# Patient Record
Sex: Male | Born: 1990
Health system: Southern US, Community
[De-identification: ages and names within clinical notes are randomized; demographics above are authoritative.]

## PROBLEM LIST (undated history)

## (undated) DIAGNOSIS — E1010 Type 1 diabetes mellitus with ketoacidosis without coma: Secondary | ICD-10-CM

## (undated) DIAGNOSIS — E119 Type 2 diabetes mellitus without complications: Secondary | ICD-10-CM

## (undated) DIAGNOSIS — Z21 Asymptomatic human immunodeficiency virus [HIV] infection status: Secondary | ICD-10-CM

## (undated) DIAGNOSIS — F988 Other specified behavioral and emotional disorders with onset usually occurring in childhood and adolescence: Secondary | ICD-10-CM

## (undated) DIAGNOSIS — D696 Thrombocytopenia, unspecified: Secondary | ICD-10-CM

## (undated) DIAGNOSIS — E785 Hyperlipidemia, unspecified: Secondary | ICD-10-CM

## (undated) DIAGNOSIS — E079 Disorder of thyroid, unspecified: Secondary | ICD-10-CM

## (undated) DIAGNOSIS — B20 Human immunodeficiency virus [HIV] disease: Secondary | ICD-10-CM

## (undated) HISTORY — DX: Human immunodeficiency virus (HIV) disease: B20

## (undated) HISTORY — DX: Asymptomatic human immunodeficiency virus (hiv) infection status: Z21

## (undated) HISTORY — DX: Other specified behavioral and emotional disorders with onset usually occurring in childhood and adolescence: F98.8

---

## 2014-09-09 ENCOUNTER — Inpatient Hospital Stay (HOSPITAL_COMMUNITY)
Admission: EM | Admit: 2014-09-09 | Discharge: 2014-09-11 | DRG: 638 | Disposition: A | Discharge status: Home / Self Care | Payer: BLUE CROSS/BLUE SHIELD | Attending: Internal Medicine | Admitting: Internal Medicine

## 2014-09-09 ENCOUNTER — Encounter (HOSPITAL_COMMUNITY): Payer: Self-pay

## 2014-09-09 DIAGNOSIS — E1010 Type 1 diabetes mellitus with ketoacidosis without coma: Secondary | ICD-10-CM | POA: Diagnosis not present

## 2014-09-09 DIAGNOSIS — E861 Hypovolemia: Secondary | ICD-10-CM | POA: Diagnosis present

## 2014-09-09 DIAGNOSIS — E871 Hypo-osmolality and hyponatremia: Secondary | ICD-10-CM | POA: Diagnosis present

## 2014-09-09 DIAGNOSIS — E869 Volume depletion, unspecified: Secondary | ICD-10-CM | POA: Diagnosis present

## 2014-09-09 DIAGNOSIS — E785 Hyperlipidemia, unspecified: Secondary | ICD-10-CM | POA: Diagnosis present

## 2014-09-09 DIAGNOSIS — D696 Thrombocytopenia, unspecified: Secondary | ICD-10-CM | POA: Diagnosis present

## 2014-09-09 DIAGNOSIS — E039 Hypothyroidism, unspecified: Secondary | ICD-10-CM | POA: Diagnosis present

## 2014-09-09 DIAGNOSIS — F17200 Nicotine dependence, unspecified, uncomplicated: Secondary | ICD-10-CM | POA: Diagnosis present

## 2014-09-09 DIAGNOSIS — Z794 Long term (current) use of insulin: Secondary | ICD-10-CM

## 2014-09-09 DIAGNOSIS — E876 Hypokalemia: Secondary | ICD-10-CM | POA: Diagnosis present

## 2014-09-09 DIAGNOSIS — E860 Dehydration: Secondary | ICD-10-CM | POA: Diagnosis present

## 2014-09-09 HISTORY — DX: Hyperlipidemia, unspecified: E78.5

## 2014-09-09 HISTORY — DX: Disorder of thyroid, unspecified: E07.9

## 2014-09-09 HISTORY — DX: Type 2 diabetes mellitus without complications: E11.9

## 2014-09-09 HISTORY — DX: Thrombocytopenia, unspecified: D69.6

## 2014-09-09 HISTORY — DX: Type 1 diabetes mellitus with ketoacidosis without coma: E10.10

## 2014-09-09 MED ORDER — SODIUM CHLORIDE 0.9 % IV BOLUS (SEPSIS)
1000.0000 mL | Freq: Once | INTRAVENOUS | Status: AC
Start: 2014-09-09 — End: 2014-09-10
  Administered 2014-09-09: 1000 mL via INTRAVENOUS

## 2014-09-09 NOTE — ED Provider Notes (Signed)
CSN: [id number]     Arrival date & time 09/09/14  2302 History  This chart was scribed for Shon Baton, MD by Doreatha Martin, ED Scribe. This patient was seen in room APA03/APA03 and the patient's care was started at 11:49 PM.     Chief Complaint  Patient presents with  . Hyperglycemia   The history is provided by the patient. No language interpreter was used.    HPI Comments: David Leon is a 24 y.o. male with hx of type I DM (new Dx 8 months ago), thyroid disease who presents to the Emergency Department complaining of moderate hyperglycemia (396 PTA) onset 3 days ago. He states associated nausea, generalized weakness, leg cramping, difficulty sleeping secondary to cramping. Pt notes that he feels dehydrated. He states that his CBG is typically above 200, but has been higher in the past 3 days. Pt states that his mother noticed an alcohol smell on his breath. He is on a sliding scale insulin regimen and takes 1000 mg Metformin BID. He states that he checks his CBG ACHS. He was recently taken off Levemir because it "didn't work for him" but was not placed on anything new. No history of DKA. Patient was previously managed by primary care physician and just recently moved to the area. He has contacted an endocrinologist as he feels he is not well controlled. He denies abdominal pain, CP, SOB, fever, vomiting.   Past Medical History  Diagnosis Date  . Diabetes mellitus without complication   . Thyroid disease    History reviewed. No pertinent past surgical history. No family history on file. Social History  Substance Use Topics  . Smoking status: Current Every Day Smoker  . Smokeless tobacco: None  . Alcohol Use: No    Review of Systems  Constitutional: Negative for fever.  Respiratory: Negative for shortness of breath.   Cardiovascular: Negative for chest pain.  Gastrointestinal: Positive for nausea. Negative for vomiting and abdominal pain.  Musculoskeletal: Positive for arthralgias  ( leg cramping).  Allergic/Immunologic:       Hyperglycemia  Neurological: Positive for weakness.  Psychiatric/Behavioral: Positive for sleep disturbance.  All other systems reviewed and are negative.  Allergies  Penicillins  Home Medications   Prior to Admission medications   Medication Sig Start Date End Date Taking? Authorizing Provider  Insulin Glulisine (APIDRA SOLOSTAR [place]) Inject into the skin.   Yes Historical Provider, MD  levothyroxine (SYNTHROID, LEVOTHROID) 100 MCG tablet Take 100 mcg by mouth daily before breakfast.   Yes Historical Provider, MD  metFORMIN (GLUCOPHAGE) 500 MG tablet Take 1,000 mg by mouth 2 (two) times daily with a meal.   Yes Historical Provider, MD   BP 137/91 mmHg  Pulse 89  Temp(Src) 98 F (36.7 C) (Oral)  Resp 20  Ht 6' (1.829 m)  Wt 187 lb (84.823 kg)  BMI 25.36 kg/m2  SpO2 100% Physical Exam  Constitutional: He is oriented to person, place, and time. He appears well-developed and well-nourished. No distress.  HENT:  Head: Normocephalic and atraumatic.  Cardiovascular: Normal rate, regular rhythm and normal heart sounds.   No murmur heard. Pulmonary/Chest: Effort normal and breath sounds normal. No respiratory distress. He has no wheezes.  Abdominal: Soft. Bowel sounds are normal. There is no tenderness. There is no rebound.  Musculoskeletal: He exhibits no edema.  Neurological: He is alert and oriented to person, place, and time.  Skin: Skin is warm and dry.  Psychiatric: He has a normal mood and affect.  Nursing note and vitals reviewed.   ED Course  Procedures (including critical care time)  CRITICAL CARE Performed by: Shon Baton   Total critical care time: 35 min  Critical care time was exclusive of separately billable procedures and treating other patients.  Critical care was necessary to treat or prevent imminent or life-threatening deterioration.  Critical care was time spent personally by me on the following  activities: development of treatment plan with patient and/or surrogate as well as nursing, discussions with consultants, evaluation of patient's response to treatment, examination of patient, obtaining history from patient or surrogate, ordering and performing treatments and interventions, ordering and review of laboratory studies, ordering and review of radiographic studies, pulse oximetry and re-evaluation of patient's condition.  DIAGNOSTIC STUDIES: Oxygen Saturation is 100% on RA, normal by my interpretation.    COORDINATION OF CARE: 11:55 PM Discussed treatment plan with pt at bedside and pt agreed to plan.   Labs Review Labs Reviewed  CBC WITH DIFFERENTIAL/PLATELET - Abnormal; Notable for the following:    MCHC 37.0 (*)    Platelets 141 (*)    All other components within normal limits  BASIC METABOLIC PANEL - Abnormal; Notable for the following:    Sodium 128 (*)    Chloride 94 (*)    CO2 16 (*)    Glucose, Bld 394 (*)    Calcium 8.4 (*)    Anion gap 18 (*)    All other components within normal limits  CBG MONITORING, ED - Abnormal; Notable for the following:    Glucose-Capillary 420 (*)    All other components within normal limits    Imaging Review No results found. I have personally reviewed and evaluated these images and lab results as part of my medical decision-making.   EKG Interpretation None      MDM   Final diagnoses:  Diabetic ketoacidosis without coma associated with type 1 diabetes mellitus    This a very pleasant gentleman with a relatively recent diagnosis of diabetes who presents with hyperglycemia, generalized fatigue, nausea, thirst.  He is only on sliding scale insulin.  Initial glucose here is 420. Patient was placed on fluids and basic labwork obtained. Lab work notable for pseudohyponatremia, anion gap of 18, bicarbonate 16. Patient appears to be in DKA.  A second liter of fluids was ordered and he was placed on an insulin drip. Urinalysis is  pending. Patient likely needs to be on a long-acting insulin and on a different insulin regimen for good control. Will admit for further management.  I personally performed the services described in this documentation, which was scribed in my presence. The recorded information has been reviewed and is accurate.   Shon Baton, MD 09/10/14 [phone]

## 2014-09-09 NOTE — ED Notes (Signed)
Pt states he feels dehydrated and his blood sugar has been running high.  Pt states he feels thirsty and has been having leg cramps.  Pt states blood sugar at home 396

## 2014-09-10 ENCOUNTER — Encounter (HOSPITAL_COMMUNITY): Payer: Self-pay | Admitting: Internal Medicine

## 2014-09-10 DIAGNOSIS — E861 Hypovolemia: Secondary | ICD-10-CM | POA: Diagnosis present

## 2014-09-10 DIAGNOSIS — E1010 Type 1 diabetes mellitus with ketoacidosis without coma: Principal | ICD-10-CM

## 2014-09-10 DIAGNOSIS — E785 Hyperlipidemia, unspecified: Secondary | ICD-10-CM | POA: Diagnosis present

## 2014-09-10 DIAGNOSIS — E860 Dehydration: Secondary | ICD-10-CM | POA: Diagnosis present

## 2014-09-10 DIAGNOSIS — E039 Hypothyroidism, unspecified: Secondary | ICD-10-CM | POA: Diagnosis present

## 2014-09-10 DIAGNOSIS — E1310 Other specified diabetes mellitus with ketoacidosis without coma: Secondary | ICD-10-CM

## 2014-09-10 DIAGNOSIS — D696 Thrombocytopenia, unspecified: Secondary | ICD-10-CM | POA: Diagnosis present

## 2014-09-10 DIAGNOSIS — E869 Volume depletion, unspecified: Secondary | ICD-10-CM | POA: Diagnosis present

## 2014-09-10 DIAGNOSIS — E871 Hypo-osmolality and hyponatremia: Secondary | ICD-10-CM | POA: Diagnosis present

## 2014-09-10 DIAGNOSIS — Z794 Long term (current) use of insulin: Secondary | ICD-10-CM | POA: Diagnosis not present

## 2014-09-10 DIAGNOSIS — F17200 Nicotine dependence, unspecified, uncomplicated: Secondary | ICD-10-CM | POA: Diagnosis present

## 2014-09-10 DIAGNOSIS — E876 Hypokalemia: Secondary | ICD-10-CM | POA: Diagnosis present

## 2014-09-10 DIAGNOSIS — E1110 Type 2 diabetes mellitus with ketoacidosis without coma: Secondary | ICD-10-CM | POA: Insufficient documentation

## 2014-09-10 HISTORY — DX: Thrombocytopenia, unspecified: D69.6

## 2014-09-10 LAB — BASIC METABOLIC PANEL
ANION GAP: 18 — AB (ref 5–15)
ANION GAP: 8 (ref 5–15)
ANION GAP: 8 (ref 5–15)
Anion gap: 15 (ref 5–15)
Anion gap: 9 (ref 5–15)
Anion gap: 11 (ref 5–15)
BUN: 6 mg/dL (ref 6–20)
BUN: 7 mg/dL (ref 6–20)
BUN: 7 mg/dL (ref 6–20)
BUN: 7 mg/dL (ref 6–20)
BUN: 7 mg/dL (ref 6–20)
BUN: 8 mg/dL (ref 6–20)
CALCIUM: 8.4 mg/dL — AB (ref 8.9–10.3)
CALCIUM: 8.4 mg/dL — AB (ref 8.9–10.3)
CHLORIDE: 105 mmol/L (ref 101–111)
CHLORIDE: 105 mmol/L (ref 101–111)
CHLORIDE: 106 mmol/L (ref 101–111)
CHLORIDE: 106 mmol/L (ref 101–111)
CHLORIDE: 94 mmol/L — AB (ref 101–111)
CO2: 14 mmol/L — AB (ref 22–32)
CO2: 16 mmol/L — AB (ref 22–32)
CO2: 18 mmol/L — ABNORMAL LOW (ref 22–32)
CO2: 19 mmol/L — ABNORMAL LOW (ref 22–32)
CO2: 20 mmol/L — ABNORMAL LOW (ref 22–32)
CO2: 22 mmol/L (ref 22–32)
CREATININE: 0.96 mg/dL (ref 0.61–1.24)
Calcium: 8.1 mg/dL — ABNORMAL LOW (ref 8.9–10.3)
Calcium: 8.3 mg/dL — ABNORMAL LOW (ref 8.9–10.3)
Calcium: 8.4 mg/dL — ABNORMAL LOW (ref 8.9–10.3)
Calcium: 8.3 mg/dL — ABNORMAL LOW (ref 8.9–10.3)
Chloride: 104 mmol/L (ref 101–111)
Creatinine, Ser: 0.79 mg/dL (ref 0.61–1.24)
Creatinine, Ser: 0.59 mg/dL — ABNORMAL LOW (ref 0.61–1.24)
Creatinine, Ser: 0.56 mg/dL — ABNORMAL LOW (ref 0.61–1.24)
Creatinine, Ser: 0.59 mg/dL — ABNORMAL LOW (ref 0.61–1.24)
Creatinine, Ser: 0.62 mg/dL (ref 0.61–1.24)
GFR calc Af Amer: >60 mL/min (ref >60)
GFR calc Af Amer: >60 mL/min (ref >60)
GFR calc Af Amer: >60 mL/min (ref >60)
GFR calc Af Amer: >60 mL/min (ref >60)
GFR calc Af Amer: >60 mL/min (ref >60)
GFR calc non Af Amer: >60 mL/min (ref >60)
GFR calc non Af Amer: >60 mL/min (ref >60)
GFR calc non Af Amer: >60 mL/min (ref >60)
GFR calc non Af Amer: >60 mL/min (ref >60)
GFR calc non Af Amer: >60 mL/min (ref >60)
GLUCOSE: 111 mg/dL — AB (ref 65–99)
GLUCOSE: 198 mg/dL — AB (ref 65–99)
GLUCOSE: 252 mg/dL — AB (ref 65–99)
GLUCOSE: 394 mg/dL — AB (ref 65–99)
Glucose, Bld: 149 mg/dL — ABNORMAL HIGH (ref 65–99)
Glucose, Bld: 164 mg/dL — ABNORMAL HIGH (ref 65–99)
POTASSIUM: 3.1 mmol/L — AB (ref 3.5–5.1)
POTASSIUM: 3.4 mmol/L — AB (ref 3.5–5.1)
POTASSIUM: 4.1 mmol/L (ref 3.5–5.1)
POTASSIUM: 3.4 mmol/L — AB (ref 3.5–5.1)
POTASSIUM: 3.2 mmol/L — AB (ref 3.5–5.1)
Potassium: 3.5 mmol/L (ref 3.5–5.1)
SODIUM: 133 mmol/L — AB (ref 135–145)
Sodium: 128 mmol/L — ABNORMAL LOW (ref 135–145)
Sodium: 134 mmol/L — ABNORMAL LOW (ref 135–145)
Sodium: 134 mmol/L — ABNORMAL LOW (ref 135–145)
Sodium: 134 mmol/L — ABNORMAL LOW (ref 135–145)
Sodium: 135 mmol/L (ref 135–145)

## 2014-09-10 LAB — HEPATIC FUNCTION PANEL
ALT: 9 U/L — ABNORMAL LOW (ref 17–63)
AST: 8 U/L — ABNORMAL LOW (ref 15–41)
Albumin: 3.2 g/dL — ABNORMAL LOW (ref 3.5–5.0)
Alkaline Phosphatase: 67 U/L (ref 38–126)
Total Bilirubin: 0.9 mg/dL (ref 0.3–1.2)
Total Protein: 6 g/dL — ABNORMAL LOW (ref 6.5–8.1)

## 2014-09-10 LAB — CBC
HEMATOCRIT: 38.4 % — AB (ref 39.0–52.0)
HEMOGLOBIN: 14.2 g/dL (ref 13.0–17.0)
MCH: 31.8 pg (ref 26.0–34.0)
MCHC: 37 g/dL — ABNORMAL HIGH (ref 30.0–36.0)
MCV: 86.1 fL (ref 78.0–100.0)
Platelets: 129 10*3/uL — ABNORMAL LOW (ref 150–400)
RBC: 4.46 MIL/uL (ref 4.22–5.81)
RDW: 12.8 % (ref 11.5–15.5)
WBC: 7 10*3/uL (ref 4.0–10.5)

## 2014-09-10 LAB — GLUCOSE, CAPILLARY
GLUCOSE-CAPILLARY: 206 mg/dL — AB (ref 65–99)
GLUCOSE-CAPILLARY: 155 mg/dL — AB (ref 65–99)
GLUCOSE-CAPILLARY: 103 mg/dL — AB (ref 65–99)
GLUCOSE-CAPILLARY: 106 mg/dL — AB (ref 65–99)
GLUCOSE-CAPILLARY: 129 mg/dL — AB (ref 65–99)
GLUCOSE-CAPILLARY: 199 mg/dL — AB (ref 65–99)
GLUCOSE-CAPILLARY: 186 mg/dL — AB (ref 65–99)
GLUCOSE-CAPILLARY: 191 mg/dL — AB (ref 65–99)
GLUCOSE-CAPILLARY: 254 mg/dL — AB (ref 65–99)
Glucose-Capillary: 167 mg/dL — ABNORMAL HIGH (ref 65–99)
Glucose-Capillary: 182 mg/dL — ABNORMAL HIGH (ref 65–99)
Glucose-Capillary: 214 mg/dL — ABNORMAL HIGH (ref 65–99)
Glucose-Capillary: 179 mg/dL — ABNORMAL HIGH (ref 65–99)
Glucose-Capillary: 137 mg/dL — ABNORMAL HIGH (ref 65–99)
Glucose-Capillary: 219 mg/dL — ABNORMAL HIGH (ref 65–99)
Glucose-Capillary: 139 mg/dL — ABNORMAL HIGH (ref 65–99)

## 2014-09-10 LAB — LACTIC ACID, PLASMA: LACTIC ACID, VENOUS: 0.7 mmol/L (ref 0.5–2.0)

## 2014-09-10 LAB — VITAMIN B12: VITAMIN B 12: 426 pg/mL (ref 180–914)

## 2014-09-10 LAB — MRSA PCR SCREENING: MRSA BY PCR: NEGATIVE

## 2014-09-10 LAB — CBC WITH DIFFERENTIAL/PLATELET
Basophils Absolute: 0 10*3/uL (ref 0.0–0.1)
Basophils Relative: 1 % (ref 0–1)
Eosinophils Absolute: 0.1 10*3/uL (ref 0.0–0.7)
Eosinophils Relative: 1 % (ref 0–5)
HEMATOCRIT: 41.1 % (ref 39.0–52.0)
Hemoglobin: 15.2 g/dL (ref 13.0–17.0)
LYMPHS PCT: 34 % (ref 12–46)
Lymphs Abs: 2.2 10*3/uL (ref 0.7–4.0)
MCH: 31.7 pg (ref 26.0–34.0)
MCHC: 37 g/dL — AB (ref 30.0–36.0)
MCV: 85.8 fL (ref 78.0–100.0)
MONO ABS: 0.4 10*3/uL (ref 0.1–1.0)
MONOS PCT: 6 % (ref 3–12)
NEUTROS ABS: 3.8 10*3/uL (ref 1.7–7.7)
Neutrophils Relative %: 58 % (ref 43–77)
Platelets: 141 10*3/uL — ABNORMAL LOW (ref 150–400)
RBC: 4.79 MIL/uL (ref 4.22–5.81)
RDW: 12.7 % (ref 11.5–15.5)
WBC: 6.5 10*3/uL (ref 4.0–10.5)

## 2014-09-10 LAB — URINALYSIS, ROUTINE W REFLEX MICROSCOPIC
Bilirubin Urine: NEGATIVE
Glucose, UA: 500 mg/dL — AB
HGB URINE DIPSTICK: NEGATIVE
Ketones, ur: >80 mg/dL — AB
LEUKOCYTES UA: NEGATIVE
Nitrite: NEGATIVE
Protein, ur: NEGATIVE mg/dL
SPECIFIC GRAVITY, URINE: 1.025 (ref 1.005–1.030)
UROBILINOGEN UA: 0.2 mg/dL (ref 0.0–1.0)
pH: 5.5 (ref 5.0–8.0)

## 2014-09-10 LAB — LIPID PANEL
Cholesterol: 212 mg/dL — ABNORMAL HIGH (ref 0–200)
HDL: 17 mg/dL — AB (ref >40)
LDL Cholesterol: UNDETERMINED mg/dL (ref 0–99)
TRIGLYCERIDES: 669 mg/dL — AB (ref <150)
Total CHOL/HDL Ratio: 12.5 RATIO
VLDL: UNDETERMINED mg/dL (ref 0–40)

## 2014-09-10 LAB — CBG MONITORING, ED
GLUCOSE-CAPILLARY: 315 mg/dL — AB (ref 65–99)
GLUCOSE-CAPILLARY: 266 mg/dL — AB (ref 65–99)
Glucose-Capillary: 420 mg/dL — ABNORMAL HIGH (ref 65–99)

## 2014-09-10 LAB — T4, FREE: FREE T4: 0.9 ng/dL (ref 0.61–1.12)

## 2014-09-10 LAB — TSH: TSH: 11.909 u[IU]/mL — AB (ref 0.350–4.500)

## 2014-09-10 LAB — PHOSPHORUS: Phosphorus: 3 mg/dL (ref 2.5–4.6)

## 2014-09-10 LAB — MAGNESIUM
Magnesium: 1.9 mg/dL (ref 1.7–2.4)
Magnesium: 2 mg/dL (ref 1.7–2.4)

## 2014-09-10 MED ORDER — INSULIN ASPART 100 UNIT/ML ~~LOC~~ SOLN
0.0000 [IU] | SUBCUTANEOUS | Status: DC
  Administered 2014-09-10: 1 [IU] via SUBCUTANEOUS
  Administered 2014-09-11: 3 [IU] via SUBCUTANEOUS
  Administered 2014-09-11: 2 [IU] via SUBCUTANEOUS

## 2014-09-10 MED ORDER — SODIUM CHLORIDE 0.9 % IV SOLN
INTRAVENOUS | Status: DC
  Administered 2014-09-10: 4.4 [IU]/h via INTRAVENOUS
  Filled 2014-09-10 (×2): qty 2.5

## 2014-09-10 MED ORDER — LIVING WELL WITH DIABETES BOOK
Freq: Once | Status: AC
  Administered 2014-09-10: 12:00:00
  Filled 2014-09-10: qty 1

## 2014-09-10 MED ORDER — POTASSIUM CHLORIDE CRYS ER 20 MEQ PO TBCR
40.0000 meq | EXTENDED_RELEASE_TABLET | Freq: Every day | ORAL | Status: DC
  Administered 2014-09-10: 40 meq via ORAL
  Filled 2014-09-10 (×2): qty 2

## 2014-09-10 MED ORDER — KCL IN DEXTROSE-NACL 20-5-0.45 MEQ/L-%-% IV SOLN: INTRAVENOUS | Status: DC

## 2014-09-10 MED ORDER — CETYLPYRIDINIUM CHLORIDE 0.05 % MT LIQD
7.0000 mL | Freq: Two times a day (BID) | OROMUCOSAL | Status: DC
  Administered 2014-09-10 – 2014-09-11 (×3): 7 mL via OROMUCOSAL

## 2014-09-10 MED ORDER — INSULIN ASPART PROT & ASPART (70-30 MIX) 100 UNIT/ML ~~LOC~~ SUSP
SUBCUTANEOUS | Status: AC
  Filled 2014-09-10: qty 10

## 2014-09-10 MED ORDER — POTASSIUM CHLORIDE 2 MEQ/ML IV SOLN: INTRAVENOUS | Status: DC

## 2014-09-10 MED ORDER — INSULIN STARTER KIT- SYRINGES (ENGLISH)
1.0000 | Freq: Once | Status: AC
  Administered 2014-09-10: 1
  Filled 2014-09-10: qty 1

## 2014-09-10 MED ORDER — SODIUM CHLORIDE 0.9 % IV SOLN: INTRAVENOUS | Status: DC

## 2014-09-10 MED ORDER — DEXTROSE-NACL 5-0.45 % IV SOLN: INTRAVENOUS | Status: DC

## 2014-09-10 MED ORDER — INSULIN GLARGINE 100 UNIT/ML ~~LOC~~ SOLN
12.0000 [IU] | Freq: Once | SUBCUTANEOUS | Status: DC
  Filled 2014-09-10: qty 0.12

## 2014-09-10 MED ORDER — POTASSIUM CHLORIDE 10 MEQ/100ML IV SOLN
10.0000 meq | INTRAVENOUS | Status: AC
  Administered 2014-09-10 (×3): 10 meq via INTRAVENOUS
  Filled 2014-09-10 (×3): qty 100

## 2014-09-10 MED ORDER — INSULIN ASPART 100 UNIT/ML ~~LOC~~ SOLN: 0.0000 [IU] | SUBCUTANEOUS | Status: DC

## 2014-09-10 MED ORDER — INSULIN REGULAR BOLUS VIA INFUSION
1.0000 [IU] | INTRAVENOUS | Status: DC | PRN
  Filled 2014-09-10: qty 10

## 2014-09-10 MED ORDER — LEVOTHYROXINE SODIUM 100 MCG PO TABS
100.0000 ug | ORAL_TABLET | Freq: Every day | ORAL | Status: DC
Start: 2014-09-10 — End: 2014-09-11
  Administered 2014-09-10 – 2014-09-11 (×2): 100 ug via ORAL
  Filled 2014-09-10 (×3): qty 1

## 2014-09-10 MED ORDER — POTASSIUM CHLORIDE CRYS ER 20 MEQ PO TBCR
40.0000 meq | EXTENDED_RELEASE_TABLET | Freq: Two times a day (BID) | ORAL | Status: DC
  Administered 2014-09-10 – 2014-09-11 (×2): 40 meq via ORAL
  Filled 2014-09-10 (×2): qty 2

## 2014-09-10 MED ORDER — ENOXAPARIN SODIUM 40 MG/0.4ML ~~LOC~~ SOLN: 40.0000 mg | SUBCUTANEOUS | Status: DC

## 2014-09-10 MED ORDER — SODIUM CHLORIDE 0.9 % IV BOLUS (SEPSIS)
1000.0000 mL | Freq: Once | INTRAVENOUS | Status: AC
Start: 2014-09-10 — End: 2014-09-10
  Administered 2014-09-10: 1000 mL via INTRAVENOUS

## 2014-09-10 MED ORDER — INSULIN ASPART 100 UNIT/ML ~~LOC~~ SOLN: 0.0000 [IU] | Freq: Three times a day (TID) | SUBCUTANEOUS | Status: DC

## 2014-09-10 MED ORDER — POTASSIUM CHLORIDE 2 MEQ/ML IV SOLN
INTRAVENOUS | Status: DC
  Administered 2014-09-10: 08:00:00 via INTRAVENOUS
  Filled 2014-09-10 (×8): qty 1000

## 2014-09-10 MED ORDER — INSULIN GLARGINE 100 UNIT/ML ~~LOC~~ SOLN
12.0000 [IU] | Freq: Every day | SUBCUTANEOUS | Status: DC
  Filled 2014-09-10 (×3): qty 0.12

## 2014-09-10 MED ORDER — INSULIN ASPART PROT & ASPART (70-30 MIX) 100 UNIT/ML ~~LOC~~ SUSP
10.0000 [IU] | Freq: Two times a day (BID) | SUBCUTANEOUS | Status: DC
  Administered 2014-09-10 – 2014-09-11 (×2): 10 [IU] via SUBCUTANEOUS
  Filled 2014-09-10: qty 10

## 2014-09-10 MED ORDER — ENOXAPARIN SODIUM 40 MG/0.4ML ~~LOC~~ SOLN
40.0000 mg | SUBCUTANEOUS | Status: DC
  Filled 2014-09-10: qty 0.4

## 2014-09-10 MED ORDER — SODIUM CHLORIDE 0.9 % IV SOLN
INTRAVENOUS | Status: DC
  Administered 2014-09-10: 2.6 [IU]/h via INTRAVENOUS
  Filled 2014-09-10: qty 2.5

## 2014-09-10 MED ORDER — DEXTROSE-NACL 5-0.45 % IV SOLN
INTRAVENOUS | Status: DC
  Administered 2014-09-10: 04:00:00 via INTRAVENOUS

## 2014-09-10 MED ORDER — POTASSIUM CHLORIDE IN NACL 20-0.9 MEQ/L-% IV SOLN
INTRAVENOUS | Status: DC
  Administered 2014-09-10 – 2014-09-11 (×2): via INTRAVENOUS

## 2014-09-10 MED ORDER — LORAZEPAM 0.5 MG PO TABS: 0.5000 mg | ORAL_TABLET | Freq: Four times a day (QID) | ORAL | Status: DC | PRN

## 2014-09-10 MED ORDER — POTASSIUM CHLORIDE 10 MEQ/100ML IV SOLN
10.0000 meq | INTRAVENOUS | Status: AC
  Administered 2014-09-10 (×2): 10 meq via INTRAVENOUS
  Filled 2014-09-10 (×2): qty 100

## 2014-09-10 MED ORDER — INSULIN REGULAR BOLUS VIA INFUSION
1.0000 [IU] | Freq: Three times a day (TID) | INTRAVENOUS | Status: DC
  Filled 2014-09-10: qty 10

## 2014-09-10 MED ORDER — SODIUM CHLORIDE 0.9 % IV SOLN
INTRAVENOUS | Status: DC
Start: 2014-09-10 — End: 2014-09-10

## 2014-09-10 NOTE — H&P (Signed)
[facility] History and Physical  David Leon ZOX:[id number] DOB: 07/21/1990    PCP:   No PCP Per Patient   Chief Complaint: weakness, polyuria, and elevated BS.   HPI: David Leon is an 24 y.o. male with hx of hypothyroidism, AODM Dx in Jan 2016, on various regimen including previous insulin injection, now Metformin at 2K mg per day, presented to the ER with BS in the 400, some nausea, no vomiting or abdominal pain, and found to have mild DKA with BS 400, bicarb 16, AG 18, and pseudo-Na of 128, with K of 3.5 mE/L.  He was started on Insulin drip and hospitalist was asked to admit him for DKA.  He denied coughs, fever, chills, dysuria, or any other symptomology.   Rewiew of Systems:  Constitutional: Negative for malaise, fever and chills. No significant weight loss or weight gain Eyes: Negative for eye pain, redness and discharge, diplopia, visual changes, or flashes of light. ENMT: Negative for ear pain, hoarseness, nasal congestion, sinus pressure and sore throat. No headaches; tinnitus, drooling, or problem swallowing. Cardiovascular: Negative for chest pain, palpitations, diaphoresis, dyspnea and peripheral edema. ; No orthopnea, PND Respiratory: Negative for cough, hemoptysis, wheezing and stridor. No pleuritic chestpain. Gastrointestinal: Negative for nausea, vomiting, diarrhea, constipation, abdominal pain, melena, blood in stool, hematemesis, jaundice and rectal bleeding.    Genitourinary: Negative for  dysuria, incontinence,flank pain and hematuria; Musculoskeletal: Negative for back pain and neck pain. Negative for swelling and trauma.;  Skin: . Negative for pruritus, rash, abrasions, bruising and skin lesion.; ulcerations Neuro: Negative for headache, lightheadedness and neck stiffness. Negative for weakness, altered level of consciousness , altered mental status, extremity weakness, burning feet, involuntary movement, seizure and syncope.  Psych: negative for anxiety,  depression, insomnia, tearfulness, panic attacks, hallucinations, paranoia, suicidal or homicidal ideation    Past Medical History  Diagnosis Date  . Diabetes mellitus without complication   . Thyroid disease     History reviewed. No pertinent past surgical history.  Medications:  HOME MEDS: Prior to Admission medications   Medication Sig Start Date End Date Taking? Authorizing Provider  Insulin Glulisine (APIDRA SOLOSTAR [place]) Inject into the skin.   Yes Historical Provider, MD  levothyroxine (SYNTHROID, LEVOTHROID) 100 MCG tablet Take 100 mcg by mouth daily before breakfast.   Yes Historical Provider, MD  metFORMIN (GLUCOPHAGE) 500 MG tablet Take 1,000 mg by mouth 2 (two) times daily with a meal.   Yes Historical Provider, MD     Allergies:  Allergies  Allergen Reactions  . Penicillins Other (See Comments)    Has not had a reaction, just family history of same    Social History:   reports that he has been smoking.  He does not have any smokeless tobacco history on file. He reports that he does not drink alcohol or use illicit drugs.  Family History: History reviewed. No pertinent family history.   Physical Exam: Filed Vitals:   09/10/14 0230 09/10/14 0249 09/10/14 0300 09/10/14 0350  BP: 110/74 110/74 112/81 115/56  Pulse:  85 91 88  Temp:  98.1 F (36.7 C) 97.7 F (36.5 C)   TempSrc:  Oral Oral   Resp:  18 18 21   Height:   6' (1.829 m)   Weight:   88.1 kg (194 lb 3.6 oz)   SpO2:  98% 98% 98%   Blood pressure 115/56, pulse 88, temperature 97.7 F (36.5 C), temperature source Oral, resp. rate 21, height 6' (1.829 m), weight 88.1 kg (194 lb  3.6 oz), SpO2 98 %.  GEN:  Pleasant patient lying in the stretcher in no acute distress; cooperative with exam. PSYCH:  alert and oriented x4; does not appear anxious or depressed; affect is appropriate. HEENT: Mucous membranes pink and anicteric; PERRLA; EOM intact; no cervical lymphadenopathy nor thyromegaly or carotid bruit;  no JVD; There were no stridor. Neck is very supple. Breasts:: Not examined CHEST WALL: No tenderness CHEST: Normal respiration, clear to auscultation bilaterally.  HEART: Regular rate and rhythm.  There are no murmur, rub, or gallops.   BACK: No kyphosis or scoliosis; no CVA tenderness ABDOMEN: soft and non-tender; no masses, no organomegaly, normal abdominal bowel sounds; no pannus; no intertriginous candida. There is no rebound and no distention. Rectal Exam: Not done EXTREMITIES: No bone or joint deformity; age-appropriate arthropathy of the hands and knees; no edema; no ulcerations.  There is no calf tenderness. Genitalia: not examined PULSES: 2+ and symmetric SKIN: Normal hydration no rash or ulceration CNS: Cranial nerves 2-12 grossly intact no focal lateralizing neurologic deficit.  Speech is fluent; uvula elevated with phonation, facial symmetry and tongue midline. DTR are normal bilaterally, cerebella exam is intact, barbinski is negative and strengths are equaled bilaterally.  No sensory loss.   Labs on Admission:  Basic Metabolic Panel:  Recent Labs Lab 09/09/14 2347 09/10/14 0241  NA 128* 135  K 3.5 3.1*  CL 94* 106  CO2 16* 14*  GLUCOSE 394* 252*  BUN 8 7  CREATININE 0.96 0.79  CALCIUM 8.4* 8.1*  MG  --  1.9  PHOS  --  3.0   CBC:  Recent Labs Lab 09/09/14 2347 09/10/14 0241  WBC 6.5 7.0  NEUTROABS 3.8  --   HGB 15.2 14.2  HCT 41.1 38.4*  MCV 85.8 86.1  PLT 141* 129*   Cardiac Enzymes: No results for input(s): CKTOTAL, CKMB, CKMBINDEX, TROPONINI in the last 168 hours.  CBG:  Recent Labs Lab 09/09/14 2325 09/10/14 0120 09/10/14 0216 09/10/14 0329  GLUCAP 420* 315* 266* 206*   Assessment/Plan Present on Admission:  . DKA, type 2 . Fluid volume depletion  PLAN: Will admit him to SDU and continue with Insulin drip.  Because he has been on Metformin, will check a lactic acid, and d/c his Metformin at this time.  Check HbA1C and lipid profile.  He  will need insulin upon discharge.  Will consult diabetic teaching.  Check urine for microalbumin.  Consider an ACE I.  He is stable full code, and will be admitted to [facility] service.   He is new to town and doesn't have a PCP as yet.   Other plans as per orders.  Code Status:FULL CODE>   Houston Siren, MD. [facility] Pager [phone] 7pm to 7am.  09/10/2014, 3:59 AM

## 2014-09-10 NOTE — Progress Notes (Signed)
Patient is a 24 year old who was initially diagnosed with type 2 diabetes mellitus, but his diagnoses was subsequently changed to type I-insulin-dependent diabetes. He had been treated with both metformin and sliding scale NovoLog. He also has a history of hypothyroidism. He was admitted this morning by Dr. Conley Rolls for weakness, polyuria secondary to DKA.  Patient was briefly seen and examined. His chart, vital signs, laboratory studies were reviewed. Agree with current management with additions below. -We'll add potassium chloride to the IV fluids. -We'll allow noncaloric beverage as requested. -We'll order magnesium level to rule out deficiency. -For workup of thrombocytopenia, will order hepatic function panel, vitamin B12, TSH, free T4. -We'll order hemoglobin A1c. -We'll give additional IV runs of potassium chloride.

## 2014-09-10 NOTE — Care Management Note (Signed)
Case Management Note  Patient Details  Name: David Leon MRN: [id number] Date of Birth: 1990-09-11  Subjective/Objective:                  Pt admitted from home with DKA. Pt lives with his parents and will return home at discharge. Pt just moved to the area from [facility] and has no PCP or endocrinologist. Pt has inquired about [facility] in [place] since he works in [place].  Action/Plan: Pt gave verbal permission for CM to fax medical records to [facility] end[facility]inology to review and schedule follow up appt. No other CM needs noted.  Expected Discharge Date:  09/13/14               Expected Discharge Plan:  Home/Self Care  In-House Referral:  NA  Discharge planning Services  CM Consult, Follow-up appt scheduled  Post Acute Care Choice:  NA Choice offered to:  NA  DME Arranged:    DME Agency:     HH Arranged:    HH Agency:     Status of Service:  Completed, signed off  Medicare Important Message Given:    Date Medicare IM Given:    Medicare IM give by:    Date Additional Medicare IM Given:    Additional Medicare Important Message give by:     If discussed at Long Length of Stay Meetings, dates discussed:    Additional Comments:  Cheryl Flash, RN 09/10/2014, 3:05 PM

## 2014-09-10 NOTE — Care Management Note (Signed)
Case Management Note  Patient Details  Name: David Leon MRN: [id number] Date of Birth: 09/21/1990  Subjective/Objective:                    Action/Plan:   Expected Discharge Date:  09/13/14               Expected Discharge Plan:  Home/Self Care  In-House Referral:  NA  Discharge planning Services  CM Consult, Follow-up appt scheduled  Post Acute Care Choice:  NA Choice offered to:  NA  DME Arranged:    DME Agency:     HH Arranged:    HH Agency:     Status of Service:  Completed, signed off  Medicare Important Message Given:    Date Medicare IM Given:    Medicare IM give by:    Date Additional Medicare IM Given:    Additional Medicare Important Message give by:     If discussed at Long Length of Stay Meetings, dates discussed:    Additional Comments: Per [facility], once MD reviews pts medical records they will call pt with appt time. Pt also given contact information for Dr. Fransico Him in case  Endocrinology cannot accept pt.  Arlyss Queen DeWitt, RN 09/10/2014, 4:17 PM

## 2014-09-10 NOTE — Progress Notes (Addendum)
Inpatient Diabetes Program Recommendations  AACE/ADA: New Consensus Statement on Inpatient Glycemic Control (2013)  Target Ranges:  Prepandial:   less than 140 mg/dL      Peak postprandial:   less than 180 mg/dL (1-2 hours)      Critically ill patients:  140 - 180 mg/dL   Results for David Leon, David Leon (MRN [id number]) as of 09/10/2014 16:54  Ref. Range 09/10/2014 05:29 09/10/2014 06:23 09/10/2014 07:34 09/10/2014 08:33 09/10/2014 09:32 09/10/2014 10:32 09/10/2014 11:36 09/10/2014 12:34 09/10/2014 13:36 09/10/2014 14:36 09/10/2014 15:40 09/10/2014 16:36  Glucose-Capillary Latest Ref Range: 65-99 mg/dL 155 (H) 103 (H) 106 (H) 129 (H) 167 (H) 182 (H) 199 (H) 214 (H) 179 (H) 137 (H) 186 (H) 219 (H)    Diabetes history: DM2 Outpatient Diabetes medications: Apidra sliding scale TID (3 units for CBG 200-249, 6 units for CBG 250-299, 8 units for CBG 300-349, 10 units for CBG greater than 350), Metformin 1000 mg BID Current orders for Inpatient glycemic control: Lantus 12 units QHS, Novolog 0-15 units Q4H  Inpatient Diabetes Program Recommendations Insulin - Basal:May want to consider switching basal insulin to 70/30 10 units BID (which will provide 14 units for basal and 6 units for meal coverage).   Note: Spoke with patient about diabetes and home regimen for diabetes control. Patient reports that he was living in [place] but has recently relocated from [place] to [place] and he does not currently have a PCP in the area. Patient currently takes Apidra sliding scale TID with meals (3 units for CBG 200-249, 6 units for CBG 250-299, 8 units for CBG 300-349, 10 units for CBG greater than 350) and Metformin 1000 mg BID as an outpatient for diabetes control. Patient states that he was taking Levemir 10 units QHS but his doctor in [place] took him off the [place] in May and told him to use Apidra when needed based on sliding scale. Patient reports that since he stopped taking Levemir his glucose has gradually  increased which he discussed with the doctor. Patient states that his former PCP told him he did not need Levemir any longer and wanted him to continue on the Apidra. Patient states that he was using Apidra insulin pens but they were costing him over $250 out of pocket (with insurance). Patient states that he would like to find an insulin option that is more affordable. Patient reports that he is currently covered under his mother's insurance and he will be getting insurance through his job shortly. Inquired about using insulin pens versus insulin syringes. Patient states he initially wanted to use insulin pen due to his fear of needles but he is open to using vial and syringe if it will be more affordable. Discussed Novolin N, Novolin R, Novolin 70/30 insulin and informed patient he can purchase these particular insulins at [facility] for $25 per vial.  Inquired about knowledge about A1C and patient reports that he does not know what an A1C is. Explained what an A1C is and patient inquired about his A1C. Informed patient that his A1C was drawn but the results are not in the chart at this time; encouraged patient to ask nursing staff about his A1C if he is not told what it is. Discussed basic pathophysiology of DM Type 2, basic home care, importance of checking CBGs and maintaining good CBG control to prevent long-term and short-term complications. Discussed impact of nutrition, exercise, stress, sickness, and medications on diabetes control.  Patient states that he has lost over 100 pounds since he  was diagnosed with diabetes in January of this year.  Discussed carbohydrates, carbohydrate goals per day and meal, along with portion sizes. Patient states that he is not followed by an endocrinologist and he would like to be referred to a local endocrinologist at time of discharge so she can get help with improving diabetes control. Talked with Tammy, RN, CM and she has made patient an appointment with Dr. Loanne Drilling on  September 1st. Educated patient on insulin administration with vial and syringe. Reviewed contents of insulin starter kit. Reviewed and demonstrated all steps of insulin administration with vial and syringe. Patient able to provide successful return demonstration.   Patient verbalized understanding of information discussed and he states that he has no further questions at this time related to diabetes.   May want to consider ordering Novolin 70/30 at time of discharge since it is more affordable and will allow patient additional time to get insurance through his employer. Then patient could transition to Lantus or Levemir once his other insurance is started if his copays are more affordable.  Thanks, Barnie Alderman, RN, MSN, CCRN, CDE Diabetes Coordinator Inpatient Diabetes Program [phone] (Team Pager) [phone] ([facility] office) [phone] [facility] office) [phone] [facility] office)

## 2014-09-11 ENCOUNTER — Encounter (HOSPITAL_COMMUNITY): Payer: Self-pay | Admitting: Internal Medicine

## 2014-09-11 DIAGNOSIS — E785 Hyperlipidemia, unspecified: Secondary | ICD-10-CM

## 2014-09-11 DIAGNOSIS — D696 Thrombocytopenia, unspecified: Secondary | ICD-10-CM

## 2014-09-11 DIAGNOSIS — E039 Hypothyroidism, unspecified: Secondary | ICD-10-CM

## 2014-09-11 HISTORY — DX: Hyperlipidemia, unspecified: E78.5

## 2014-09-11 LAB — BASIC METABOLIC PANEL
ANION GAP: 10 (ref 5–15)
BUN: 7 mg/dL (ref 6–20)
CHLORIDE: 106 mmol/L (ref 101–111)
CO2: 20 mmol/L — ABNORMAL LOW (ref 22–32)
Calcium: 8.5 mg/dL — ABNORMAL LOW (ref 8.9–10.3)
Creatinine, Ser: 0.5 mg/dL — ABNORMAL LOW (ref 0.61–1.24)
GFR calc non Af Amer: >60 mL/min (ref >60)
Glucose, Bld: 217 mg/dL — ABNORMAL HIGH (ref 65–99)
POTASSIUM: 3.5 mmol/L (ref 3.5–5.1)
SODIUM: 136 mmol/L (ref 135–145)

## 2014-09-11 LAB — CBC
HCT: 37.1 % — ABNORMAL LOW (ref 39.0–52.0)
HEMOGLOBIN: 13.2 g/dL (ref 13.0–17.0)
MCH: 30.6 pg (ref 26.0–34.0)
MCHC: 35.6 g/dL (ref 30.0–36.0)
MCV: 86.1 fL (ref 78.0–100.0)
Platelets: 125 10*3/uL — ABNORMAL LOW (ref 150–400)
RBC: 4.31 MIL/uL (ref 4.22–5.81)
RDW: 13 % (ref 11.5–15.5)
WBC: 4.8 10*3/uL (ref 4.0–10.5)

## 2014-09-11 LAB — GLUCOSE, CAPILLARY
GLUCOSE-CAPILLARY: 182 mg/dL — AB (ref 65–99)
Glucose-Capillary: 112 mg/dL — ABNORMAL HIGH (ref 65–99)
Glucose-Capillary: 201 mg/dL — ABNORMAL HIGH (ref 65–99)

## 2014-09-11 MED ORDER — ATORVASTATIN CALCIUM 10 MG PO TABS: 10.0000 mg | ORAL_TABLET | Freq: Every day | ORAL | Status: DC

## 2014-09-11 MED ORDER — LEVOTHYROXINE SODIUM 112 MCG PO TABS: 112.0000 ug | ORAL_TABLET | Freq: Every day | ORAL | Status: DC

## 2014-09-11 MED ORDER — INSULIN NPH ISOPHANE & REGULAR (70-30) 100 UNIT/ML ~~LOC~~ SUSP: 12.0000 [IU] | Freq: Two times a day (BID) | SUBCUTANEOUS | Status: DC

## 2014-09-11 NOTE — Progress Notes (Signed)
Pt is to be discharged today. Pt is in NAD, IV is out, all paperwork has been reviewed/discussed with patient, and there are no questions/concerns at this time. Assessment is unchanged from this morning. Pt is to be accompanied downstairs by staff and family via wheelchair.  

## 2014-09-11 NOTE — Discharge Summary (Signed)
Physician Discharge Summary  David Leon NWG:[id number] DOB: [birth year] DOA: 09/09/2014  PCP: No PCP Per Patient  Admit date: 09/09/2014 Discharge date: 09/11/2014  Time spent: Greater than 30 minutes  Recommendations for Outpatient Follow-up:  1. Recommend follow-up on hemoglobin A1c result pending. Recommend rechecking patient's TSH and free T4 in 3-6 months on a slightly higher dose of levothyroxine.  2. Recommend monitoring of the patient's platelet count periodically.   Discharge Diagnoses:  1. Type 1 diabetes mellitus with DKA. 2. Volume depletion/dehydration. 3. Hyponatremia secondary to hyperglycemia and volume depletion. 4. Thrombocytopenia of unknown etiology. 5. Hypothyroidism. 6. Hyperlipidemia. 7. Hypokalemia secondary to improvement in glycemic control/resolution of DKA.  Discharge Condition: Improved.  Diet recommendation: Carb modified.  Filed Weights   09/09/14 2320 09/10/14 0300 09/11/14 0400  Weight: 84.823 kg (187 lb) 88.1 kg (194 lb 3.6 oz) 88.1 kg (194 lb 3.6 oz)    History of present illness:  The patient is a 24 year old man with a history of diabetes mellitus, originally diagnosed is adult onset, but later corrected to insulin-dependent diabetes mellitus and hypothyroidism, who presented to the emergency department on 09/09/2014 with a chief complaint of generalized weakness, frequent urination, and elevated blood sugars at home. In the ED, he was found to be in DKA with an anion gap of 18, bicarbonate of 16, and blood glucose of 400. He was admitted for further evaluation and management.  Hospital Course:  The patient was vigorously hydrated with IV fluids. The insulin drip protocol for DKA was initiated and continued until his anion gap closed and DKA resolved. Following the resolution of DKA, he was started on Lantus and sliding scale NovoLog. The diabetes coordinator discussed the patient's diabetes history with him. During their conversation, apparently,  he has had difficulty with affording Levemir. For more affordability, Lantus was discontinued in favor of Novolin 70/30 which he can purchase at [facility] for $25 per bowel. He was in agreement with this.  Patient's serum sodium was 128 on admission. Following vigorous IV fluids with normal saline, it normalized. During the course of treatment of DKA, his serum potassium decrease. He was repleted/supplemented with intravenous runs and oral potassium chloride. His serum potassium was 3.5 at the time of discharge. Fasting lipid profile was assessed and revealed total cholesterol of 212, triglycerides of 669, and HDL cholesterol of 17. He was started on Lipitor at the time of discharge. His TSH was found to be elevated at approximately 12, but his free T4 was within normal limits at 0.9. It was felt that he would benefit from a higher dose of Synthroid. Therefore, the dose was increased to 112 g from 100 g at the time of discharge. The patient was also found to be thrombocytopenic. His vitamin B12 level was assessed and was found to be within normal limits. Etiology of his thrombocytopenia was unknown, but will need to be monitored periodically in the outpatient setting. Hemoglobin A1c was ordered, but the result was pending at the time of discharge.  The patient was discharged improved in stable condition. His capillary blood glucose was 182.   Procedures:  None  Consultations:  None  Discharge Exam: Filed Vitals:   09/11/14 0800  BP: 110/71  Pulse: 81  Temp:   Resp: 13   temperature 97.3. Oxygen saturation 100% on room air.  General: Pleasant 24 year old man in no acute distress. Cardiovascular: S1, S2, no murmurs rubs or gallops. Respiratory: Clear to auscultation bilaterally. Abdomen: Positive bowel sounds, soft, nontender, nondistended.  Discharge Instructions  Discharge Instructions    Diet Carb Modified    Complete by:  As directed      Increase activity slowly    Complete by:   As directed           Current Discharge Medication List    START taking these medications   Details  atorvastatin (LIPITOR) 10 MG tablet Take 1 tablet (10 mg total) by mouth daily. Qty: 30 tablet, Refills: 3    insulin NPH-regular Human (NOVOLIN 70/30) (70-30) 100 UNIT/ML injection Inject 12 Units into the skin 2 (two) times daily with a meal. Qty: 10 mL, Refills: 11      CONTINUE these medications which have CHANGED   Details  levothyroxine (SYNTHROID, LEVOTHROID) 112 MCG tablet Take 1 tablet (112 mcg total) by mouth daily before breakfast. Qty: 30 tablet, Refills: 3      STOP taking these medications     Insulin Glulisine (APIDRA SOLOSTAR) 100 UNIT/ML Solostar Pen      metFORMIN (GLUCOPHAGE) 1000 MG tablet        Allergies  Allergen Reactions  . Penicillins Other (See Comments)    Has not had a reaction, just family history of same   Follow-up Information    Follow up with Romero Belling, MD On 09/23/2014.   Specialty:  Endocrinology   Why:  at 10:15   Contact information:   [address] [place] [address] [phone]        The results of significant diagnostics from this hospitalization (including imaging, microbiology, ancillary and laboratory) are listed below for reference.    Significant Diagnostic Studies: No results found.  Microbiology: Recent Results (from the past 240 hour(s))  MRSA PCR Screening     Status: None   Collection Time: 09/10/14  6:00 AM  Result Value Ref Range Status   MRSA by PCR NEGATIVE NEGATIVE Final    Comment:        The GeneXpert MRSA Assay (FDA approved for NASAL specimens only), is one component of a comprehensive MRSA colonization surveillance program. It is not intended to diagnose MRSA infection nor to guide or monitor treatment for MRSA infections.      Labs: Basic Metabolic Panel:  Recent Labs Lab 09/10/14 0241 09/10/14 0742 09/10/14 1105 09/10/14 1457 09/10/14 2008  09/11/14 0436  NA 135 134* 134* 133* 134* 136  K 3.1* 3.4* 4.1 3.4* 3.2* 3.5  CL 106 106 105 105 104 106  CO2 14* 19* 18* 20* 22 20*  GLUCOSE 252* 111* 198* 149* 164* 217*  BUN 7 7 7 6 7 7   CREATININE 0.79 0.59* 0.56* 0.59* 0.62 0.50*  CALCIUM 8.1* 8.3* 8.4* 8.3* 8.4* 8.5*  MG 1.9 2.0  --   --   --   --   PHOS 3.0  --   --   --   --   --    Liver Function Tests:  Recent Labs Lab 09/10/14 0742  AST 8*  ALT 9*  ALKPHOS 67  BILITOT 0.9  PROT 6.0*  ALBUMIN 3.2*   No results for input(s): LIPASE, AMYLASE in the last 168 hours. No results for input(s): AMMONIA in the last 168 hours. CBC:  Recent Labs Lab 09/09/14 2347 09/10/14 0241 09/11/14 0436  WBC 6.5 7.0 4.8  NEUTROABS 3.8  --   --   HGB 15.2 14.2 13.2  HCT 41.1 38.4* 37.1*  MCV 85.8 86.1 86.1  PLT 141* 129* 125*   Cardiac Enzymes: No results for input(s):  CKTOTAL, CKMB, CKMBINDEX, TROPONINI in the last 168 hours. BNP: BNP (last 3 results) No results for input(s): BNP in the last 8760 hours.  ProBNP (last 3 results) No results for input(s): PROBNP in the last 8760 hours.  CBG:  Recent Labs Lab 09/10/14 1904 09/10/14 2028 09/11/14 0012 09/11/14 0426 09/11/14 0731  GLUCAP 254* 139* 112* 201* 182*       Signed:  Janiece Scovill  [facility] 09/11/2014, 9:02 AM

## 2014-09-12 LAB — MICROALBUMIN / CREATININE URINE RATIO
Creatinine, Urine: 102.3 mg/dL
Microalb Creat Ratio: 4.4 mg/g creat (ref 0.0–30.0)
Microalb, Ur: 4.5 ug/mL — ABNORMAL HIGH

## 2014-09-13 LAB — HEMOGLOBIN A1C
HEMOGLOBIN A1C: 15 % — AB (ref 4.8–5.6)
MEAN PLASMA GLUCOSE: 384 mg/dL

## 2014-09-23 ENCOUNTER — Encounter: Payer: Self-pay | Admitting: Endocrinology

## 2014-09-23 ENCOUNTER — Ambulatory Visit (INDEPENDENT_AMBULATORY_CARE_PROVIDER_SITE_OTHER): Payer: BLUE CROSS/BLUE SHIELD | Admitting: Endocrinology

## 2014-09-23 VITALS — BP 118/80 | HR 73 | Temp 97.9°F

## 2014-09-23 DIAGNOSIS — E109 Type 1 diabetes mellitus without complications: Secondary | ICD-10-CM | POA: Diagnosis not present

## 2014-09-23 DIAGNOSIS — E119 Type 2 diabetes mellitus without complications: Secondary | ICD-10-CM | POA: Insufficient documentation

## 2014-09-23 MED ORDER — GLUCAGON (RDNA) 1 MG IJ KIT: 1.0000 mg | PACK | Freq: Once | INTRAMUSCULAR | Status: AC | PRN

## 2014-09-23 NOTE — Patient Instructions (Addendum)
good diet and exercise significantly improve the control of your diabetes.  please let me know if you wish to be referred to a dietician.  high blood sugar is very risky to your health.  you should see an eye doctor and dentist every year.  It is very important to get all recommended vaccinations.  controlling your blood pressure and cholesterol drastically reduces the damage diabetes does to your body.  Those who smoke should quit.  please discuss these with your doctor.  check your blood sugar 4 times a day: before the 3 meals, and at bedtime.  also check if you have symptoms of your blood sugar being too high or too low.  please keep a record of the readings and bring it to your next appointment here.  You can write it on any piece of paper.  please call us sooner if your blood sugar goes below 70, or if you have a lot of readings over 200. Please increase the insulin to 16 units with breakfast, and 12 units with the evening meal. On this type of insulin schedule, you should eat meals on a regular schedule.  If a meal is missed or significantly delayed, your blood sugar could go low.  Please call next week, to tell us how the blood sugar is doing. Please come back for a follow-up appointment in 2 months.  We'll recheck the thyroid blood test then.   At our office, we are fortunate to have two specialists who are happy to help you:   Leonia Reader, RN, CDE, is a diabetes educator and pump trainer.  She is here on Monday mornings, and all day Tuesday and Wednesday.  She is can help you with low blood sugar avoidance and treatment, injecting insulin, sick day management, and others.   Antonieta Iba, RD is our dietician.  She is here all day Thursday and Friday.  She can advise you about a healthy diet.  She can also help you about a variety of special diabetes situations, such as shift work, Actor, gluten-free, diet for kidney patients, traveling with diabetes, and help for those who need to gain  weight.   i have sent a prescription to your pharmacy, for a medication called "glucagon."  This is a single-use emergency kit.  It will help you when your blood sugar is so low, you need someone else to help you.  The side-effect is nausea, so you should be turned on your side after getting this shot.

## 2014-09-23 NOTE — Progress Notes (Signed)
Subjective:    Patient ID: David Leon, male    DOB: 1990/09/26, 24 y.o.   MRN: [id number]  HPI pt states DM was dx'ed in early 2016; he has mild if any neuropathy of the lower extremities; he is unaware of any associated chronic complications; he has been on insulin since April of 2016; pt says his diet and exercise are good; he has never had pancreatitis or severe hypoglycemia.  He was hospitalized a few weeks ago with DKA, but he was on just prn insulin then (0-10 units per day).  He has lost 120 lbs x 8 months.  Since hosp d/c, he takes 70/30, 12 units bid.  He says cbg's vary from 210-280.  It is in general higher as the day goes on.   Past Medical History  Diagnosis Date  . Diabetes mellitus without complication   . Thyroid disease   . Hyperlipidemia 09/11/2014  . Diabetic ketoacidosis without coma associated with type 1 diabetes mellitus   . Thrombocytopenia 09/10/2014    No past surgical history on file.  Social History   Social History  . Marital Status: Single    Spouse Name: N/A  . Number of Children: N/A  . Years of Education: N/A   Occupational History  . Not on file.   Social History Main Topics  . Smoking status: Current Every Day Smoker  . Smokeless tobacco: Current User  . Alcohol Use: No  . Drug Use: No  . Sexual Activity: Yes    Birth Control/ Protection: None   Other Topics Concern  . Not on file   Social History Narrative    Current Outpatient Prescriptions on File Prior to Visit  Medication Sig Dispense Refill  . atorvastatin (LIPITOR) 10 MG tablet Take 1 tablet (10 mg total) by mouth daily. 30 tablet 3  . levothyroxine (SYNTHROID, LEVOTHROID) 112 MCG tablet Take 1 tablet (112 mcg total) by mouth daily before breakfast. 30 tablet 3   No current facility-administered medications on file prior to visit.    Allergies  Allergen Reactions  . Penicillins Other (See Comments)    Has not had a reaction, just family history of same    Family  History  Problem Relation Age of Onset  . Diabetes Father     BP 118/80 mmHg  Pulse 73  Temp(Src) 97.9 F (36.6 C) (Oral)  SpO2 97%  Review of Systems denies fever, blurry vision, headache, chest pain, sob, n/v, urinary frequency, muscle cramps, excessive diaphoresis, depression, cold intolerance, rhinorrhea, and easy bruising.      Objective:   Physical Exam VS: see vs page GEN: no distress HEAD: head: no deformity eyes: no periorbital swelling, no proptosis external nose and ears are normal mouth: no lesion seen NECK: supple, thyroid is not enlarged CHEST WALL: no deformity LUNGS: clear to auscultation BREASTS:  No gynecomastia CV: reg rate and rhythm, no murmur ABD: abdomen is soft, nontender.  no hepatosplenomegaly.  not distended.  no hernia MUSCULOSKELETAL: muscle bulk and strength are grossly normal.  no obvious joint swelling.  gait is normal and steady EXTEMITIES: no deformity.  no ulcer on the feet.  feet are of normal color and temp.  no edema PULSES: dorsalis pedis intact bilat.  no carotid bruit NEURO:  cn 2-12 grossly intact.   readily moves all 4's.  sensation is intact to touch on the feet SKIN:  Normal texture and temperature.  No rash or suspicious lesion is visible.   NODES:  None palpable  at the neck PSYCH: alert, well-oriented.  Does not appear anxious nor depressed.  Lab Results  Component Value Date   HGBA1C 15.0* 09/10/2014   I have reviewed outside records: d/c summary: pt was rx'ed with inexpensive human insulin, due to his request for inexpensive insulin.      Assessment & Plan:  Type 1 DM: recently dx'ed.  He needs increased rx. Hypothyroidism, new, mild   Patient is advised the following: Patient Instructions  good diet and exercise significantly improve the control of your diabetes.  please let me know if you wish to be referred to a dietician.  high blood sugar is very risky to your health.  you should see an eye doctor and dentist every  year.  It is very important to get all recommended vaccinations.  controlling your blood pressure and cholesterol drastically reduces the damage diabetes does to your body.  Those who smoke should quit.  please discuss these with your doctor.  check your blood sugar 4 times a day: before the 3 meals, and at bedtime.  also check if you have symptoms of your blood sugar being too high or too low.  please keep a record of the readings and bring it to your next appointment here.  You can write it on any piece of paper.  please call us sooner if your blood sugar goes below 70, or if you have a lot of readings over 200. Please increase the insulin to 16 units with breakfast, and 12 units with the evening meal. On this type of insulin schedule, you should eat meals on a regular schedule.  If a meal is missed or significantly delayed, your blood sugar could go low.  Please call next week, to tell us how the blood sugar is doing. Please come back for a follow-up appointment in 2 months.  We'll recheck the thyroid blood test then.   At our office, we are fortunate to have two specialists who are happy to help you:   Leonia Reader, RN, CDE, is a diabetes educator and pump trainer.  She is here on Monday mornings, and all day Tuesday and Wednesday.  She is can help you with low blood sugar avoidance and treatment, injecting insulin, sick day management, and others.   Antonieta Iba, RD is our dietician.  She is here all day Thursday and Friday.  She can advise you about a healthy diet.  She can also help you about a variety of special diabetes situations, such as shift work, Actor, gluten-free, diet for kidney patients, traveling with diabetes, and help for those who need to gain weight.   i have sent a prescription to your pharmacy, for a medication called "glucagon."  This is a single-use emergency kit.  It will help you when your blood sugar is so low, you need someone else to help you.  The side-effect is  nausea, so you should be turned on your side after getting this shot.

## 2014-11-24 ENCOUNTER — Ambulatory Visit: Payer: BLUE CROSS/BLUE SHIELD | Admitting: Endocrinology

## 2014-11-25 ENCOUNTER — Ambulatory Visit: Payer: BLUE CROSS/BLUE SHIELD | Admitting: Endocrinology

## 2014-11-25 ENCOUNTER — Ambulatory Visit: Payer: BLUE CROSS/BLUE SHIELD | Admitting: Dietician

## 2015-01-25 ENCOUNTER — Ambulatory Visit: Payer: BLUE CROSS/BLUE SHIELD | Admitting: Endocrinology

## 2015-03-08 ENCOUNTER — Ambulatory Visit: Payer: BLUE CROSS/BLUE SHIELD | Admitting: Endocrinology

## 2015-08-16 ENCOUNTER — Ambulatory Visit (INDEPENDENT_AMBULATORY_CARE_PROVIDER_SITE_OTHER): Payer: Managed Care, Other (non HMO) | Admitting: Endocrinology

## 2015-08-16 ENCOUNTER — Encounter: Payer: Self-pay | Admitting: Endocrinology

## 2015-08-16 VITALS — BP 135/94 | HR 116 | Temp 98.6°F | Ht 71.0 in | Wt 186.2 lb

## 2015-08-16 DIAGNOSIS — E039 Hypothyroidism, unspecified: Secondary | ICD-10-CM

## 2015-08-16 DIAGNOSIS — E119 Type 2 diabetes mellitus without complications: Secondary | ICD-10-CM

## 2015-08-16 LAB — POCT GLYCOSYLATED HEMOGLOBIN (HGB A1C): HEMOGLOBIN A1C: 14.1

## 2015-08-16 LAB — TSH: TSH: 6.49 u[IU]/mL — ABNORMAL HIGH (ref 0.35–4.50)

## 2015-08-16 MED ORDER — INSULIN NPH ISOPHANE & REGULAR (70-30) 100 UNIT/ML ~~LOC~~ SUSP: SUBCUTANEOUS | 11 refills | Status: DC

## 2015-08-16 MED ORDER — LEVOTHYROXINE SODIUM 112 MCG PO TABS: 112.0000 ug | ORAL_TABLET | Freq: Every day | ORAL | 11 refills | Status: DC

## 2015-08-16 NOTE — Patient Instructions (Signed)
Please increase the insulin to 25 units with breakfast (15 if you are going to be active), and 15 units with the evening meal. check your blood sugar 4 times a day: before the 3 meals, and at bedtime.  also check if you have symptoms of your blood sugar being too high or too low.  please keep a record of the readings and bring it to your next appointment here.  You can write it on any piece of paper.  please call us sooner if your blood sugar goes below 70, or if you have a lot of readings over 200.  Please come back for a follow-up appointment in 6 weeks.

## 2015-08-16 NOTE — Progress Notes (Signed)
Subjective:    Patient ID: David Leon, male    DOB: [birth year], 25 y.o.   MRN: [id number]  HPI  Pt returns for f/u of diabetes mellitus: DM type: 1 Dx'ed: 2016 Complications: none Therapy: insulin since dx DKA: only at dx Severe hypoglycemia: never Pancreatitis: never Other: he takes human insulin, due to cost; he declines multiple daily injections. Interval history: he takes 16 units qam and 12 units qpm.  no cbg record, but states cbg's are persistently in the 200's.  It is in general higher as the day goes on.  However, he says cbg's are well-controlled during the day, if he is active.  He says he seldom misses the insulin.  pt states he feels well in general, except for dry mouth.  He takes synthroid as rx'ed, except he ran out 4 days ago.  Past Medical History:  Diagnosis Date  . Diabetes mellitus without complication (HCC)   . Diabetic ketoacidosis without coma associated with type 1 diabetes mellitus (HCC)   . Hyperlipidemia 09/11/2014  . Thrombocytopenia (HCC) 09/10/2014  . Thyroid disease     No past surgical history on file.  Social History   Social History  . Marital status: Single    Spouse name: N/A  . Number of children: N/A  . Years of education: N/A   Occupational History  . Not on file.   Social History Main Topics  . Smoking status: Current Every Day Smoker  . Smokeless tobacco: Current User  . Alcohol use No  . Drug use: No  . Sexual activity: Yes    Birth control/ protection: None   Other Topics Concern  . Not on file   Social History Narrative  . No narrative on file    Current Outpatient Prescriptions on File Prior to Visit  Medication Sig Dispense Refill  . atorvastatin (LIPITOR) 10 MG tablet Take 1 tablet (10 mg total) by mouth daily. 30 tablet 3  . glucagon 1 MG injection Inject 1 mg into the muscle once as needed. 1 each 12   No current facility-administered medications on file prior to visit.     Allergies  Allergen Reactions    . Penicillins Other (See Comments)    Has not had a reaction, just family history of same    Family History  Problem Relation Age of Onset  . Diabetes Father     BP (!) 135/94   Pulse (!) 116   Temp 98.6 F (37 C) (Oral)   Ht 5\' 11"  (1.803 m)   Wt 186 lb 3.2 oz (84.5 kg)   SpO2 98%   BMI 25.97 kg/m    Review of Systems He denies hypoglycemia.      Objective:   Physical Exam VITAL SIGNS:  See vs page GENERAL: no distress Pulses: dorsalis pedis intact bilat.   MSK: no deformity of the feet CV: no leg edema Skin:  no ulcer on the feet.  normal color and temp on the feet. Neuro: sensation is intact to touch on the feet  A1c=14.1%.  Lab Results  Component Value Date   TSH 6.49 (H) 08/16/2015      Assessment & Plan:  Insulin-requiring type 2 DM: he needs increased rx Noncompliance with synthroid. Hypothyroidism: not well-controlled, due to the above.  HTN, on no rx now: with possible situational component: we'll recheck next time.   Patient is advised the following: Patient Instructions  Please increase the insulin to 25 units with breakfast (15 if you are  going to be active), and 15 units with the evening meal. check your blood sugar 4 times a day: before the 3 meals, and at bedtime.  also check if you have symptoms of your blood sugar being too high or too low.  please keep a record of the readings and bring it to your next appointment here.  You can write it on any piece of paper.  please call us sooner if your blood sugar goes below 70, or if you have a lot of readings over 200.  Please come back for a follow-up appointment in 6 weeks.   Romero Belling, MD

## 2015-08-17 ENCOUNTER — Other Ambulatory Visit: Payer: Self-pay

## 2015-08-17 ENCOUNTER — Telehealth: Payer: Self-pay | Admitting: Endocrinology

## 2015-08-17 NOTE — Telephone Encounter (Signed)
Patient is returning your call.  

## 2015-08-17 NOTE — Telephone Encounter (Signed)
REFER TO LAB NOTES.

## 2015-09-28 ENCOUNTER — Ambulatory Visit: Payer: Managed Care, Other (non HMO) | Admitting: Endocrinology

## 2015-09-28 DIAGNOSIS — Z0289 Encounter for other administrative examinations: Secondary | ICD-10-CM

## 2015-11-07 ENCOUNTER — Telehealth: Payer: Self-pay | Admitting: Endocrinology

## 2015-11-07 MED ORDER — LEVOTHYROXINE SODIUM 112 MCG PO TABS: 112.0000 ug | ORAL_TABLET | Freq: Every day | ORAL | 6 refills | Status: DC

## 2015-11-07 NOTE — Telephone Encounter (Signed)
[facility] [address]

## 2015-11-07 NOTE — Telephone Encounter (Signed)
Refill submitted. 

## 2016-02-14 ENCOUNTER — Ambulatory Visit: Payer: Managed Care, Other (non HMO) | Admitting: Endocrinology

## 2016-02-24 ENCOUNTER — Encounter: Payer: Self-pay | Admitting: Endocrinology

## 2016-02-24 ENCOUNTER — Ambulatory Visit (INDEPENDENT_AMBULATORY_CARE_PROVIDER_SITE_OTHER): Payer: Managed Care, Other (non HMO) | Admitting: Endocrinology

## 2016-02-24 ENCOUNTER — Other Ambulatory Visit: Payer: Self-pay

## 2016-02-24 ENCOUNTER — Other Ambulatory Visit: Payer: Managed Care, Other (non HMO)

## 2016-02-24 VITALS — BP 122/80 | HR 120 | Ht 71.0 in | Wt 181.0 lb

## 2016-02-24 DIAGNOSIS — R7301 Impaired fasting glucose: Secondary | ICD-10-CM

## 2016-02-24 DIAGNOSIS — E109 Type 1 diabetes mellitus without complications: Secondary | ICD-10-CM | POA: Diagnosis not present

## 2016-02-24 DIAGNOSIS — Z0000 Encounter for general adult medical examination without abnormal findings: Secondary | ICD-10-CM | POA: Diagnosis not present

## 2016-02-24 LAB — CBC WITH DIFFERENTIAL/PLATELET
BASOS ABS: 46 {cells}/uL (ref 0–200)
Basophils Relative: 1 %
EOS PCT: 1 %
Eosinophils Absolute: 46 cells/uL (ref 15–500)
HCT: 45.7 % (ref 38.5–50.0)
Hemoglobin: 16 g/dL (ref 13.2–17.1)
Lymphocytes Relative: 43 %
Lymphs Abs: 1978 cells/uL (ref 850–3900)
MCH: 33.2 pg — AB (ref 27.0–33.0)
MCHC: 35 g/dL (ref 32.0–36.0)
MCV: 94.8 fL (ref 80.0–100.0)
MONOS PCT: 7 %
MPV: 10.1 fL (ref 7.5–12.5)
Monocytes Absolute: 322 cells/uL (ref 200–950)
NEUTROS ABS: 2208 {cells}/uL (ref 1500–7800)
NEUTROS PCT: 48 %
PLATELETS: 175 10*3/uL (ref 140–400)
RBC: 4.82 MIL/uL (ref 4.20–5.80)
RDW: 13.4 % (ref 11.0–15.0)
WBC: 4.6 10*3/uL (ref 3.8–10.8)

## 2016-02-24 LAB — LIPID PANEL
CHOL/HDL RATIO: 11
Cholesterol: 361 mg/dL — ABNORMAL HIGH (ref 0–200)
HDL: 31.9 mg/dL — ABNORMAL LOW (ref >39.00)
Triglycerides: 1670 mg/dL — ABNORMAL HIGH (ref 0.0–149.0)

## 2016-02-24 LAB — BASIC METABOLIC PANEL
BUN: 12 mg/dL (ref 6–23)
CALCIUM: 9.9 mg/dL (ref 8.4–10.5)
CO2: 25 meq/L (ref 19–32)
CREATININE: 0.78 mg/dL (ref 0.40–1.50)
Chloride: 95 mEq/L — ABNORMAL LOW (ref 96–112)
GFR: 127.63 mL/min (ref >60.00)
GLUCOSE: 240 mg/dL — AB (ref 70–99)
Potassium: 4.1 mEq/L (ref 3.5–5.1)
SODIUM: 130 meq/L — AB (ref 135–145)

## 2016-02-24 LAB — URINALYSIS, ROUTINE W REFLEX MICROSCOPIC
HGB URINE DIPSTICK: NEGATIVE
LEUKOCYTES UA: NEGATIVE
NITRITE: NEGATIVE
RBC / HPF: NONE SEEN (ref 0–?)
Specific Gravity, Urine: 1.02 (ref 1.000–1.030)
Total Protein, Urine: NEGATIVE
Urobilinogen, UA: 0.2 (ref 0.0–1.0)
pH: 6 (ref 5.0–8.0)

## 2016-02-24 LAB — POCT GLYCOSYLATED HEMOGLOBIN (HGB A1C): HEMOGLOBIN A1C: 13

## 2016-02-24 LAB — LDL CHOLESTEROL, DIRECT: LDL DIRECT: 71 mg/dL

## 2016-02-24 LAB — MICROALBUMIN / CREATININE URINE RATIO
Creatinine,U: 53.2 mg/dL
MICROALB/CREAT RATIO: 7.1 mg/g (ref 0.0–30.0)
Microalb, Ur: 3.8 mg/dL — ABNORMAL HIGH (ref 0.0–1.9)

## 2016-02-24 LAB — HEPATIC FUNCTION PANEL
ALBUMIN: 4.6 g/dL (ref 3.5–5.2)
ALK PHOS: 74 U/L (ref 39–117)
ALT: 7 U/L (ref 0–53)
AST: 9 U/L (ref 0–37)
BILIRUBIN TOTAL: 0.4 mg/dL (ref 0.2–1.2)
Bilirubin, Direct: 0.1 mg/dL (ref 0.0–0.3)
Total Protein: 7.2 g/dL (ref 6.0–8.3)

## 2016-02-24 LAB — TSH: TSH: 18.68 u[IU]/mL — ABNORMAL HIGH (ref 0.35–4.50)

## 2016-02-24 MED ORDER — BASAGLAR KWIKPEN 100 UNIT/ML ~~LOC~~ SOPN: 35.0000 [IU] | PEN_INJECTOR | SUBCUTANEOUS | 11 refills | Status: DC

## 2016-02-24 NOTE — Progress Notes (Signed)
Subjective:    Patient ID: David Leon, male    DOB: May 27, 1990, 26 y.o.   MRN: [id number]  HPI Pt returns for f/u of diabetes mellitus:  DM type: 1 Dx'ed: 2016 Complications: none Therapy: insulin since dx DKA: only once, at dx.   Severe hypoglycemia: never Pancreatitis: never Other: he takes human insulin, due to cost; he declines multiple daily injections; he says levemir "put me in the ICU." Interval history: he often skips the AM insulin, due to mild hypoglycemia.  no cbg record, but states cbg's vary from 220-260.  It is in general higher as the day goes on, unless if he is active.  pt states he feels well in general, except for persistent dry mouth, .  He says he never misses the synthroid.  Pt says he was not notified of July, 2017 lab results, but epic says yes.   Past Medical History:  Diagnosis Date  . Diabetes mellitus without complication (HCC)   . Diabetic ketoacidosis without coma associated with type 1 diabetes mellitus (HCC)   . Hyperlipidemia 09/11/2014  . Thrombocytopenia (HCC) 09/10/2014  . Thyroid disease     No past surgical history on file.  Social History   Social History  . Marital status: Single    Spouse name: N/A  . Number of children: N/A  . Years of education: N/A   Occupational History  . Not on file.   Social History Main Topics  . Smoking status: Current Every Day Smoker  . Smokeless tobacco: Current User  . Alcohol use No  . Drug use: No  . Sexual activity: Yes    Birth control/ protection: None   Other Topics Concern  . Not on file   Social History Narrative  . No narrative on file    Current Outpatient Prescriptions on File Prior to Visit  Medication Sig Dispense Refill  . glucagon 1 MG injection Inject 1 mg into the muscle once as needed. 1 each 12  . atorvastatin (LIPITOR) 10 MG tablet Take 1 tablet (10 mg total) by mouth daily. (Patient not taking: Reported on 02/24/2016) 30 tablet 3   No current facility-administered  medications on file prior to visit.     Allergies  Allergen Reactions  . Penicillins Other (See Comments)    Has not had a reaction, just family history of same    Family History  Problem Relation Age of Onset  . Diabetes Father     BP 122/80   Pulse (!) 120   Ht 5\' 11"  (1.803 m)   Wt 181 lb (82.1 kg)   SpO2 97%   BMI 25.24 kg/m   Review of Systems Denies LOC and n/v.  He has fatigue, depression, anxiety, fatigue, blurry vision, doe, weight loss, and leg cramps.     Objective:   Physical Exam VITAL SIGNS:  See vs page GENERAL: no distress Pulses: dorsalis pedis intact bilat.   MSK: no deformity of the feet.  CV: no leg edema.  Skin:  no ulcer on the feet.  normal color and temp on the feet.  Neuro: sensation is intact to touch on the feet.    A1c=13%. Urine pos for ketones     Assessment & Plan:  Type 1 DM: severe exacerbation.  Noncompliance with cbg recording, insulin, and f/u ov's: He needs a simpler regimen.  Hypothyroidism: therapy is prob limited by noncompliance.   Patient is advised the following: Patient Instructions  Please change the insulin to basaglar, 35 units  each morning.  I have sent a prescription to your pharmacy.   When you see your new PCP next week, please work on the depression symptoms.  Getting this better will help your diabetes.   check your blood sugar 4 times a day: before the 3 meals, and at bedtime.  also check if you have symptoms of your blood sugar being too high or too low.  please keep a record of the readings and bring it to your next appointment here.  You can write it on any piece of paper.  please call us sooner if your blood sugar goes below 70, or if you have a lot of readings over 200.   Please come back for a follow-up appointment in 2 weeks.    I have sent a prescription to your pharmacy, to increase synthroid.

## 2016-02-24 NOTE — Patient Instructions (Addendum)
Please change the insulin to basaglar, 35 units each morning.  I have sent a prescription to your pharmacy.   When you see your new PCP next week, please work on the depression symptoms.  Getting this better will help your diabetes.   check your blood sugar 4 times a day: before the 3 meals, and at bedtime.  also check if you have symptoms of your blood sugar being too high or too low.  please keep a record of the readings and bring it to your next appointment here.  You can write it on any piece of paper.  please call us sooner if your blood sugar goes below 70, or if you have a lot of readings over 200.   Please come back for a follow-up appointment in 2 weeks.

## 2016-02-25 MED ORDER — LEVOTHYROXINE SODIUM 150 MCG PO TABS
150.0000 ug | ORAL_TABLET | Freq: Every day | ORAL | 11 refills | Status: DC
Start: 2016-02-25 — End: 2016-05-17

## 2016-02-27 ENCOUNTER — Ambulatory Visit (INDEPENDENT_AMBULATORY_CARE_PROVIDER_SITE_OTHER): Payer: Managed Care, Other (non HMO) | Admitting: Family Medicine

## 2016-02-27 ENCOUNTER — Encounter: Payer: Self-pay | Admitting: Family Medicine

## 2016-02-27 VITALS — BP 116/82 | HR 97 | Temp 98.6°F | Resp 18 | Wt 183.0 lb

## 2016-02-27 DIAGNOSIS — E109 Type 1 diabetes mellitus without complications: Secondary | ICD-10-CM

## 2016-02-27 DIAGNOSIS — F32A Depression, unspecified: Secondary | ICD-10-CM

## 2016-02-27 DIAGNOSIS — F5104 Psychophysiologic insomnia: Secondary | ICD-10-CM | POA: Diagnosis not present

## 2016-02-27 DIAGNOSIS — F418 Other specified anxiety disorders: Secondary | ICD-10-CM

## 2016-02-27 DIAGNOSIS — F419 Anxiety disorder, unspecified: Principal | ICD-10-CM

## 2016-02-27 DIAGNOSIS — F329 Major depressive disorder, single episode, unspecified: Secondary | ICD-10-CM

## 2016-02-27 MED ORDER — BUPROPION HCL ER (XL) 150 MG PO TB24: ORAL_TABLET | ORAL | 0 refills | Status: DC

## 2016-02-27 MED ORDER — TRAZODONE HCL 50 MG PO TABS: 25.0000 mg | ORAL_TABLET | Freq: Every evening | ORAL | 3 refills | Status: DC | PRN

## 2016-02-27 NOTE — Progress Notes (Signed)
Pre visit review using our clinic review tool, if applicable. No additional management support is needed unless otherwise documented below in the visit note. 

## 2016-02-27 NOTE — Patient Instructions (Signed)
It was a pleasure to meet you today, thanks for coming in to establish care I have sent in two medications to your pharmacy- one for depression and one to take at night for sleep Please let me know how you are doing via Mychart before you are out of the bupropion so I can send in a refill.  If your mood gets worse, please let me know or go to the ER. Continue to work to bring down your blood sugar- I think this will greatly help with some of your physical symtpoms Please follow up in 6-8 weeks  Bupropion extended-release tablets (Depression/Mood Disorders) What is this medicine? BUPROPION (byoo PROE pee on) is used to treat depression. This medicine may be used for other purposes; ask your health care provider or pharmacist if you have questions. COMMON BRAND NAME(S): Aplenzin, Budeprion XL, Forfivo XL, Wellbutrin XL What should I tell my health care provider before I take this medicine? They need to know if you have any of these conditions: -an eating disorder, such as anorexia or bulimia -bipolar disorder or psychosis -diabetes or high blood sugar, treated with medication -glaucoma -head injury or brain tumor -heart disease, previous heart attack, or irregular heart beat -high blood pressure -kidney or liver disease -seizures (convulsions) -suicidal thoughts or a previous suicide attempt -Tourette's syndrome -weight loss -an unusual or allergic reaction to bupropion, other medicines, foods, dyes, or preservatives -breast-feeding -pregnant or trying to become pregnant How should I use this medicine? Take this medicine by mouth with a glass of water. Follow the directions on the prescription label. You can take it with or without food. If it upsets your stomach, take it with food. Do not crush, chew, or cut these tablets. This medicine is taken once daily at the same time each day. Do not take your medicine more often than directed. Do not stop taking this medicine suddenly except upon  the advice of your doctor. Stopping this medicine too quickly may cause serious side effects or your condition may worsen. A special MedGuide will be given to you by the pharmacist with each prescription and refill. Be sure to read this information carefully each time. Talk to your pediatrician regarding the use of this medicine in children. Special care may be needed. Overdosage: If you think you have taken too much of this medicine contact a poison control center or emergency room at once. NOTE: This medicine is only for you. Do not share this medicine with others. What if I miss a dose? If you miss a dose, skip the missed dose and take your next tablet at the regular time. Do not take double or extra doses. What may interact with this medicine? Do not take this medicine with any of the following medications: -linezolid -MAOIs like Azilect, Carbex, Eldepryl, Marplan, Nardil, and Parnate -methylene blue (injected into a vein) -other medicines that contain bupropion like Zyban This medicine may also interact with the following medications: -alcohol -certain medicines for anxiety or sleep -certain medicines for blood pressure like metoprolol, propranolol -certain medicines for depression or psychotic disturbances -certain medicines for HIV or AIDS like efavirenz, lopinavir, nelfinavir, ritonavir -certain medicines for irregular heart beat like propafenone, flecainide -certain medicines for Parkinson's disease like amantadine, levodopa -certain medicines for seizures like carbamazepine, phenytoin, phenobarbital -cimetidine -clopidogrel -cyclophosphamide -digoxin -furazolidone -isoniazid -nicotine -orphenadrine -procarbazine -steroid medicines like prednisone or cortisone -stimulant medicines for attention disorders, weight loss, or to stay awake -tamoxifen -theophylline -thiotepa -ticlopidine -tramadol -warfarin This list may not  describe all possible interactions. Give your  health care provider a list of all the medicines, herbs, non-prescription drugs, or dietary supplements you use. Also tell them if you smoke, drink alcohol, or use illegal drugs. Some items may interact with your medicine. What should I watch for while using this medicine? Tell your doctor if your symptoms do not get better or if they get worse. Visit your doctor or health care professional for regular checks on your progress. Because it may take several weeks to see the full effects of this medicine, it is important to continue your treatment as prescribed by your doctor. Patients and their families should watch out for new or worsening thoughts of suicide or depression. Also watch out for sudden changes in feelings such as feeling anxious, agitated, panicky, irritable, hostile, aggressive, impulsive, severely restless, overly excited and hyperactive, or not being able to sleep. If this happens, especially at the beginning of treatment or after a change in dose, call your health care professional. Avoid alcoholic drinks while taking this medicine. Drinking large amounts of alcoholic beverages, using sleeping or anxiety medicines, or quickly stopping the use of these agents while taking this medicine may increase your risk for a seizure. Do not drive or use heavy machinery until you know how this medicine affects you. This medicine can impair your ability to perform these tasks. Do not take this medicine close to bedtime. It may prevent you from sleeping. Your mouth may get dry. Chewing sugarless gum or sucking hard candy, and drinking plenty of water may help. Contact your doctor if the problem does not go away or is severe. The tablet shell for some brands of this medicine does not dissolve. This is normal. The tablet shell may appear whole in the stool. This is not a cause for concern. What side effects may I notice from receiving this medicine? Side effects that you should report to your doctor or  health care professional as soon as possible: -allergic reactions like skin rash, itching or hives, swelling of the face, lips, or tongue -breathing problems -changes in vision -confusion -elevated mood, decreased need for sleep, racing thoughts, impulsive behavior -fast or irregular heartbeat -hallucinations, loss of contact with reality -increased blood pressure -redness, blistering, peeling or loosening of the skin, including inside the mouth -seizures -suicidal thoughts or other mood changes -unusually weak or tired -vomiting Side effects that usually do not require medical attention (report to your doctor or health care professional if they continue or are bothersome): -constipation -headache -loss of appetite -nausea -tremors -weight loss This list may not describe all possible side effects. Call your doctor for medical advice about side effects. You may report side effects to FDA at 1-800-FDA-1088. Where should I keep my medicine? Keep out of the reach of children. Store at room temperature between 15 and 30 degrees C (59 and 86 degrees F). Throw away any unused medicine after the expiration date. NOTE: This sheet is a summary. It may not cover all possible information. If you have questions about this medicine, talk to your doctor, pharmacist, or health care provider.  2017 Elsevier/Gold Standard (2015-07-01 13:55:13)

## 2016-02-27 NOTE — Progress Notes (Addendum)
Subjective:    Patient ID: David Leon, male    DOB: 01/17/1991, 26 y.o.   MRN: [id number]  HPI This is a pleasant 26 yo male who presents today to establish care. He has DM type 1 (diagnosed 3 years ago) and is followed by Dr. Everardo AllEllison. He was seen by Dr. Everardo AllEllison last week. Has had issues with insulin affordability and noncompliance in the past. He had labs 02/24/16 with HgbA1C 13, ketones in urine, TSH 18.6, abnormal lipids with triglycerides 1670. His insulin was changed to Basilar 35 units qam and synthroid increased to 150 mcg. Started basilar insulin 3 days ago. Seeing some improvement. Has an appointment 03/13/16. This morning blood sugar 280. Was high two days ago after eating chili with beans and bread at his parent's house. Saw dietician when first diagnosed with DM. Is always thirsty and urinates frequently. Gets frequent skin boils on inner thighs. Has been using an antibacterial soap in affected areas lately with improvement. No problems with these today.   His main concern today is his mood. He has noticed over the last several months he is not happy, is  miserable at work. 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 Does not see a career change, hopes to move more into higher level and not have to do so much day to day management. Has been going to school full time to get his masters degree, is taking a semester off. Has about 1 year remaining. Can't focus, has no energy, can barely get himself to work. Does not want to socialize or exercise. Has not felt like this before. Prior to symptoms starting, he had a break up. Denies homicidal/sucicidal ideations. Mother has history of depression. Feels like he is at a standstill. Saw a counselor at school for 4 months, didn't seem to help. Does not like to admit his weaknesses or talk to people about his problems. Doesn't sleep well, this is long standing. Goes to sleep ok, can't stay asleep, urinates frequently through the night.  Had a panic attack last week, has never had before. More irritable according to his family. Drinks 5 cups of coffee a day, none after 1 pm, drinks a lot of water. Does not watch much TV, is on computer before bed. Has intermittently used benadryl/nyquil for sleep.     Past Medical History:  Diagnosis Date  . Diabetes mellitus without complication (HCC)   . Diabetic ketoacidosis without coma associated with type 1 diabetes mellitus (HCC)   . Hyperlipidemia 09/11/2014  . Thrombocytopenia (HCC) 09/10/2014  . Thyroid disease    No past surgical history on file. Family History  Problem Relation Age of Onset  . Diabetes Father    Social History  Substance Use Topics  . Smoking status: Current Every Day Smoker  . Smokeless tobacco: Never Used  . Alcohol use No      Review of Systems  Constitutional: Positive for fatigue.  Endocrine: Positive for polydipsia and polyuria.  Psychiatric/Behavioral: Positive for decreased concentration, dysphoric mood and sleep disturbance. Negative for self-injury and suicidal ideas. The patient is nervous/anxious.        Objective:   Physical Exam Physical Exam  Vitals reviewed. Constitutional: Oriented to person, place, and time. Appears well-developed and well-nourished.  HENT:  Head: Normocephalic and atraumatic.  Eyes: Conjunctivae are normal.  Neck: Normal range of motion. Neck supple.  Cardiovascular: Normal rate.   Pulmonary/Chest: Effort normal.  Musculoskeletal: Normal range of motion.  Neurological:  Alert and oriented to person, place, and time.  Skin: Skin is warm and dry.  Psychiatric: Normal mood and affect. Behavior is normal. Judgment and thought content normal.      BP 116/82 (BP Location: Left Arm, Patient Position: Sitting, Cuff Size: Normal)   Pulse 97   Temp 98.6 F (37 C) (Oral)   Resp 18   Wt 183 lb (83 kg)   SpO2 98%   BMI 25.52 kg/m  Wt Readings from Last 3 Encounters:  02/27/16 183 lb (83 kg)  02/24/16 181 lb  (82.1 kg)  08/16/15 186 lb 3.2 oz (84.5 kg)   Depression screen PHQ 2/9 02/27/2016  Down, Depressed, Hopeless 2  PHQ - 2 Score 2  Altered sleeping 3  Tired, decreased energy 3  Change in appetite 3  Feeling bad or failure about yourself  0  Moving slowly or fidgety/restless 0  Suicidal thoughts 0  PHQ-9 Score 11  Difficult doing work/chores Extremely dIfficult       Assessment & Plan:  1. Anxiety and depression - offered referral to therapy, he is not interested at this time - discussed medication use and if symptoms worsen to go to ER or RTC - buPROPion (WELLBUTRIN XL) 150 MG 24 hr tablet; Take 1 tablet by mouth every morning x 1 week then increase to 2 tablets every morning.  Dispense: 50 tablet; Refill: 0 - follow up in 6-8 weeks, he will let me know via Mychart how dose of bupropion is working and I will send in 300 mg tablets. - encouraged regular exercise   2. Psychophysiological insomnia - discussed good sleep hygiene and encouraged him to avoid electronics 1-2 hours before bedtime - traZODone (DESYREL) 50 MG tablet; Take 0.5-1 tablets (25-50 mg total) by mouth at bedtime as needed for sleep.  Dispense: 30 tablet; Refill: 3  3. Type 1 diabetes mellitus without complication (HCC) - discussed his current hyperglycemic state and physical manifestations (fatigue, polyuria, polydipsia) as well as long term consequences of poor control.  - he has follow up scheduled with Dr. Everardo All in about 2 weeks and I have encouraged him to see one of the dieticians in the endocrine office. Discussed food choices briefly with him and encouraged him to make notes on meals/foods that seem to raise blood sugars as well as effects of exercise (hoping this will motivate him to exercise regularly).  - he is overdue for Tdap, pneumococcal, doesn't want Flu vaccine. Will consider getting at next visit.   Olean Ree, FNP-BC  [facility] [place], [facility]  02/27/2016 11:34 AM

## 2016-02-28 ENCOUNTER — Telehealth: Payer: Self-pay | Admitting: Family Medicine

## 2016-02-28 NOTE — Telephone Encounter (Signed)
Patients pharmacy did not receive his prescription post visit on Monday 02/27/2016.

## 2016-02-28 NOTE — Progress Notes (Signed)
Medical screening examination/treatment/procedure(s) were performed by non-physician practitioner and as supervising physician I was immediately available for consultation/collaboration. I agree with above. Mazie Fencl, DO   

## 2016-02-29 ENCOUNTER — Other Ambulatory Visit: Payer: Self-pay | Admitting: Family Medicine

## 2016-02-29 DIAGNOSIS — F32A Depression, unspecified: Secondary | ICD-10-CM

## 2016-02-29 DIAGNOSIS — F419 Anxiety disorder, unspecified: Principal | ICD-10-CM

## 2016-02-29 DIAGNOSIS — F329 Major depressive disorder, single episode, unspecified: Secondary | ICD-10-CM

## 2016-02-29 DIAGNOSIS — F5104 Psychophysiologic insomnia: Secondary | ICD-10-CM

## 2016-02-29 MED ORDER — BUPROPION HCL ER (XL) 150 MG PO TB24: ORAL_TABLET | ORAL | 0 refills | Status: DC

## 2016-02-29 MED ORDER — TRAZODONE HCL 50 MG PO TABS: 25.0000 mg | ORAL_TABLET | Freq: Every evening | ORAL | 3 refills | Status: DC | PRN

## 2016-02-29 NOTE — Telephone Encounter (Signed)
Spoke with patient, pharmacy stated they did not have orders. Called pharmacy, they did not receive. Orders for bupropion and trazodone resent.

## 2016-02-29 NOTE — Progress Notes (Signed)
Received call from patient that his prescriptions were not received by pharmacy. Called [facility] on [address], they did not receive prescriptions for trazadone or bupropion but they show as being ordered/sent in system. Orders resent electronically.

## 2016-03-13 ENCOUNTER — Encounter: Payer: Self-pay | Admitting: Endocrinology

## 2016-03-13 ENCOUNTER — Ambulatory Visit (INDEPENDENT_AMBULATORY_CARE_PROVIDER_SITE_OTHER): Payer: Managed Care, Other (non HMO) | Admitting: Endocrinology

## 2016-03-13 VITALS — BP 122/80 | HR 96 | Ht 71.0 in | Wt 194.0 lb

## 2016-03-13 DIAGNOSIS — E109 Type 1 diabetes mellitus without complications: Secondary | ICD-10-CM | POA: Diagnosis not present

## 2016-03-13 MED ORDER — BASAGLAR KWIKPEN 100 UNIT/ML ~~LOC~~ SOPN: 40.0000 [IU] | PEN_INJECTOR | SUBCUTANEOUS | 11 refills | Status: DC

## 2016-03-13 MED ORDER — GLUCOSE BLOOD VI STRP: 1.0000 | ORAL_STRIP | Freq: Four times a day (QID) | 12 refills | Status: DC

## 2016-03-13 NOTE — Progress Notes (Signed)
Subjective:    Patient ID: David Leon, male    DOB: [birth year], 26 y.o.   MRN: [id number]  HPI Pt returns for f/u of diabetes mellitus:  DM type: 1 Dx'ed: 2016 Complications: none Therapy: insulin since dx DKA: only once, at dx.   Severe hypoglycemia: never Pancreatitis: never Other: he takes human insulin, due to cost; he declines multiple daily injections; he says levemir "put me in the ICU." Interval history: no cbg record, but states cbg's are in the high-100's.  pt states he feels much better in general.   Past Medical History:  Diagnosis Date  . Diabetes mellitus without complication (HCC)   . Diabetic ketoacidosis without coma associated with type 1 diabetes mellitus (HCC)   . Hyperlipidemia 09/11/2014  . Thrombocytopenia (HCC) 09/10/2014  . Thyroid disease     No past surgical history on file.  Social History   Social History  . Marital status: Single    Spouse name: N/A  . Number of children: N/A  . Years of education: N/A   Occupational History  . Not on file.   Social History Main Topics  . Smoking status: Current Every Day Smoker  . Smokeless tobacco: Never Used  . Alcohol use No  . Drug use: No  . Sexual activity: Yes    Birth control/ protection: None   Other Topics Concern  . Not on file   Social History Narrative  . No narrative on file    Current Outpatient Prescriptions on File Prior to Visit  Medication Sig Dispense Refill  . atorvastatin (LIPITOR) 10 MG tablet Take 1 tablet (10 mg total) by mouth daily. 30 tablet 3  . buPROPion (WELLBUTRIN XL) 150 MG 24 hr tablet Take 1 tablet by mouth every morning x 1 week then increase to 2 tablets every morning. 50 tablet 0  . glucagon 1 MG injection Inject 1 mg into the muscle once as needed. 1 each 12  . levothyroxine (SYNTHROID) 150 MCG tablet Take 1 tablet (150 mcg total) by mouth daily before breakfast. 30 tablet 11  . traZODone (DESYREL) 50 MG tablet Take 0.5-1 tablets (25-50 mg total) by  mouth at bedtime as needed for sleep. 30 tablet 3   No current facility-administered medications on file prior to visit.     Allergies  Allergen Reactions  . Penicillins Other (See Comments)    Has not had a reaction, just family history of same    Family History  Problem Relation Age of Onset  . Diabetes Father     BP 122/80   Pulse 96   Ht 5\' 11"  (1.803 m)   Wt 194 lb (88 kg)   SpO2 97%   BMI 27.06 kg/m    Review of Systems He denies hypoglycemia.      Objective:   Physical Exam VITAL SIGNS:  See vs page GENERAL: no distress SKIN:  Insulin injection sites at the anterior abdomen are normal.        Assessment & Plan:  Type 1 DM: he needs increased rx  Patient is advised the following: Patient Instructions  Please increase the basaglar to 40 units each morning.    check your blood sugar 4 times a day: before the 3 meals, and at bedtime.  also check if you have symptoms of your blood sugar being too high or too low.  please keep a record of the readings and bring it to your next appointment here.  You can write it on any  piece of paper.  please call us sooner if your blood sugar goes below 70, or if you have a lot of readings over 200.   Please come back for a follow-up appointment in 2 months.

## 2016-03-13 NOTE — Patient Instructions (Addendum)
Please increase the basaglar to 40 units each morning.    check your blood sugar 4 times a day: before the 3 meals, and at bedtime.  also check if you have symptoms of your blood sugar being too high or too low.  please keep a record of the readings and bring it to your next appointment here.  You can write it on any piece of paper.  please call us sooner if your blood sugar goes below 70, or if you have a lot of readings over 200.   Please come back for a follow-up appointment in 2 months.

## 2016-04-09 ENCOUNTER — Ambulatory Visit: Payer: Managed Care, Other (non HMO) | Admitting: Family Medicine

## 2016-04-12 ENCOUNTER — Encounter: Payer: Managed Care, Other (non HMO) | Admitting: Dietician

## 2016-04-13 ENCOUNTER — Other Ambulatory Visit: Payer: Self-pay | Admitting: Family Medicine

## 2016-04-13 DIAGNOSIS — F419 Anxiety disorder, unspecified: Principal | ICD-10-CM

## 2016-04-13 DIAGNOSIS — F329 Major depressive disorder, single episode, unspecified: Secondary | ICD-10-CM

## 2016-04-16 ENCOUNTER — Other Ambulatory Visit: Payer: Self-pay | Admitting: Family Medicine

## 2016-04-16 DIAGNOSIS — F32A Depression, unspecified: Secondary | ICD-10-CM

## 2016-04-16 DIAGNOSIS — F419 Anxiety disorder, unspecified: Principal | ICD-10-CM

## 2016-04-16 DIAGNOSIS — F329 Major depressive disorder, single episode, unspecified: Secondary | ICD-10-CM

## 2016-05-10 ENCOUNTER — Ambulatory Visit: Payer: Managed Care, Other (non HMO) | Admitting: Endocrinology

## 2016-05-16 ENCOUNTER — Ambulatory Visit (INDEPENDENT_AMBULATORY_CARE_PROVIDER_SITE_OTHER): Payer: Managed Care, Other (non HMO) | Admitting: Endocrinology

## 2016-05-16 VITALS — BP 122/84 | HR 117 | Ht 71.0 in | Wt 199.0 lb

## 2016-05-16 DIAGNOSIS — E039 Hypothyroidism, unspecified: Secondary | ICD-10-CM | POA: Diagnosis not present

## 2016-05-16 DIAGNOSIS — E109 Type 1 diabetes mellitus without complications: Secondary | ICD-10-CM | POA: Diagnosis not present

## 2016-05-16 LAB — TSH: TSH: 6.75 u[IU]/mL — AB (ref 0.35–4.50)

## 2016-05-16 LAB — POCT GLYCOSYLATED HEMOGLOBIN (HGB A1C): HEMOGLOBIN A1C: 12.2

## 2016-05-16 NOTE — Progress Notes (Signed)
Subjective:    Patient ID: David Leon, male    DOB: May 25, 1990, 26 y.o.   MRN: [id number]  HPI Pt returns for f/u of diabetes mellitus:  DM type: 1 Dx'ed: 2016 Complications: none Therapy: insulin since dx DKA: only once, at dx.   Severe hypoglycemia: never Pancreatitis: never Other: he takes human insulin, due to cost; he declines multiple daily injections; he says levemir "put me in the ICU."  Interval history: no cbg record, but states cbg's vary from 180-220.  pt states he feels well in general. He says his cbg meter is 27 year old.  He says he never misses the insulin.   He takes synthroid as rx'ed.   Past Medical History:  Diagnosis Date  . Diabetes mellitus without complication (HCC)   . Diabetic ketoacidosis without coma associated with type 1 diabetes mellitus (HCC)   . Hyperlipidemia 09/11/2014  . Thrombocytopenia (HCC) 09/10/2014  . Thyroid disease     No past surgical history on file.  Social History   Social History  . Marital status: Single    Spouse name: N/A  . Number of children: N/A  . Years of education: N/A   Occupational History  . Not on file.   Social History Main Topics  . Smoking status: Current Every Day Smoker  . Smokeless tobacco: Never Used  . Alcohol use No  . Drug use: No  . Sexual activity: Yes    Birth control/ protection: None   Other Topics Concern  . Not on file   Social History Narrative  . No narrative on file    Current Outpatient Prescriptions on File Prior to Visit  Medication Sig Dispense Refill  . atorvastatin (LIPITOR) 10 MG tablet Take 1 tablet (10 mg total) by mouth daily. 30 tablet 3  . glucagon 1 MG injection Inject 1 mg into the muscle once as needed. 1 each 12  . glucose blood (ONETOUCH VERIO) test strip 1 each by Other route 4 (four) times daily. And lancets 4/day 120 each 12  . traZODone (DESYREL) 50 MG tablet Take 0.5-1 tablets (25-50 mg total) by mouth at bedtime as needed for sleep. 30 tablet 3  .  buPROPion (WELLBUTRIN XL) 300 MG 24 hr tablet Take 1 tablet (300 mg total) by mouth daily. Needs office visit for additional refills (Patient not taking: Reported on 05/16/2016) 90 tablet 0   No current facility-administered medications on file prior to visit.     Allergies  Allergen Reactions  . Penicillins Other (See Comments)    Has not had a reaction, just family history of same    Family History  Problem Relation Age of Onset  . Diabetes Father     BP 122/84   Pulse (!) 117   Ht  (1.803 m)   Wt 199 lb (90.3 kg)   SpO2 98%   BMI 27.75 kg/m   Review of Systems He denies hypoglycemia.      Objective:   Physical Exam VITAL SIGNS:  See vs page GENERAL: no distress Pulses: dorsalis pedis intact bilat.   MSK: no deformity of the feet CV: no leg edema.  Skin:  no ulcer on the feet.  normal color and temp on the feet. Neuro: sensation is intact to touch on the feet.   a1c=12.2% Fructosamine=550 Lab Results  Component Value Date   TSH 6.75 (H) 05/16/2016      Assessment & Plan:  Insulin-requiring type 2 DM: very poor glycemic control.  Increase basaglar to 50/d. Hypothyroidism: I have sent a prescription to your pharmacy, to increase synthroid.  Meter malfunction:  I have sent a prescription to your pharmacy, for a new meter.   Patient Instructions  Please continue the same insulin for now. A different type of diabetes blood test is requested for you today.  We'll let you know about the results.  This will reconcile the difference between the a1c and your blood sugar readings.     check your blood sugar 4 times a day: before the 3 meals, and at bedtime.  also check if you have symptoms of your blood sugar being too high or too low.  please keep a record of the readings and bring it to your next appointment here.  You can write it on any piece of paper.  please call us sooner if your blood sugar goes below 70, or if you have a lot of readings over 200.   Please  come back for a follow-up appointment in 2-3 months.

## 2016-05-16 NOTE — Patient Instructions (Addendum)
Please continue the same insulin for now. A different type of diabetes blood test is requested for you today.  We'll let you know about the results.  This will reconcile the difference between the a1c and your blood sugar readings.     check your blood sugar 4 times a day: before the 3 meals, and at bedtime.  also check if you have symptoms of your blood sugar being too high or too low.  please keep a record of the readings and bring it to your next appointment here.  You can write it on any piece of paper.  please call us sooner if your blood sugar goes below 70, or if you have a lot of readings over 200.   Please come back for a follow-up appointment in 2-3 months.

## 2016-05-17 MED ORDER — LEVOTHYROXINE SODIUM 175 MCG PO TABS: 175.0000 ug | ORAL_TABLET | Freq: Every day | ORAL | 11 refills | Status: DC

## 2016-05-18 LAB — FRUCTOSAMINE: Fructosamine: 555 umol/L — ABNORMAL HIGH (ref 190–270)

## 2016-05-18 MED ORDER — ONETOUCH VERIO W/DEVICE KIT: 1.0000 | PACK | Freq: Once | 0 refills | Status: AC

## 2016-05-18 MED ORDER — BASAGLAR KWIKPEN 100 UNIT/ML ~~LOC~~ SOPN: 50.0000 [IU] | PEN_INJECTOR | SUBCUTANEOUS | 11 refills | Status: DC

## 2016-05-29 ENCOUNTER — Telehealth: Payer: Self-pay | Admitting: Family Medicine

## 2016-05-29 NOTE — Telephone Encounter (Signed)
Okay to refill, doesn't show that it is time for a refill.

## 2016-05-29 NOTE — Telephone Encounter (Signed)
He needs an office visit for additional refills. This was on his last prescription. Please call him and schedule a visit. Remind him that the medication takes awhile to build up in his system and should not be stopped abruptly.

## 2016-05-29 NOTE — Telephone Encounter (Signed)
° °  Patient called stating he needed RX buPROPion (WELLBUTRIN XL) 300 MG 24 hr tablet filled. Patient said he has been about a month without it now. Would like RX to go to   [facility] [place] - [address] [phone] ([phone] (Fax)   Please call pharmacy and advise and please call patient and advise when RX sent.

## 2016-05-30 NOTE — Telephone Encounter (Signed)
Called and spoke with pt informing pt that prescription was filled 04/13/16 and sent to his pharmacy. Patient expressed frustration and said that there is no reason he should be out of his medication. Called Pharmacy to verify if prescription had been picked up. Pharmacy informed me that the prescription had not yet been picked up and has been filled since 04/13/16. Called and left message on patients voicemail informing him that prescription has been ready at the pharmacy for pick up.

## 2016-06-20 ENCOUNTER — Ambulatory Visit: Payer: Managed Care, Other (non HMO) | Admitting: Endocrinology

## 2016-06-28 ENCOUNTER — Other Ambulatory Visit: Payer: Self-pay | Admitting: Family Medicine

## 2016-06-28 DIAGNOSIS — F329 Major depressive disorder, single episode, unspecified: Secondary | ICD-10-CM

## 2016-06-28 DIAGNOSIS — F32A Depression, unspecified: Secondary | ICD-10-CM

## 2016-06-28 DIAGNOSIS — F419 Anxiety disorder, unspecified: Principal | ICD-10-CM

## 2016-07-13 ENCOUNTER — Ambulatory Visit: Payer: Self-pay | Admitting: Family Medicine

## 2016-07-13 DIAGNOSIS — Z0289 Encounter for other administrative examinations: Secondary | ICD-10-CM

## 2016-08-08 ENCOUNTER — Ambulatory Visit: Payer: Managed Care, Other (non HMO) | Admitting: Endocrinology

## 2016-08-09 ENCOUNTER — Other Ambulatory Visit: Payer: Self-pay | Admitting: Family Medicine

## 2016-08-09 DIAGNOSIS — F419 Anxiety disorder, unspecified: Principal | ICD-10-CM

## 2016-08-09 DIAGNOSIS — F329 Major depressive disorder, single episode, unspecified: Secondary | ICD-10-CM

## 2016-08-09 DIAGNOSIS — F32A Depression, unspecified: Secondary | ICD-10-CM

## 2016-08-10 MED ORDER — BUPROPION HCL ER (XL) 300 MG PO TB24: 300.0000 mg | ORAL_TABLET | Freq: Every day | ORAL | 1 refills | Status: DC

## 2016-11-02 NOTE — Telephone Encounter (Addendum)
Pt left v/m requesting refill bupropion to [facility]. I spoke with Merton Border at [employer] and pt already has available refill for bupropion; arthur will get ready for pick up. Per DPR left detailed message to pick up rx at [facility] and keep appt on 11/05/16.

## 2016-11-05 ENCOUNTER — Ambulatory Visit: Payer: Managed Care, Other (non HMO) | Admitting: Family Medicine

## 2016-11-05 ENCOUNTER — Encounter: Payer: Self-pay | Admitting: Family Medicine

## 2016-12-25 ENCOUNTER — Other Ambulatory Visit: Payer: Self-pay | Admitting: Family Medicine

## 2016-12-25 DIAGNOSIS — F419 Anxiety disorder, unspecified: Principal | ICD-10-CM

## 2016-12-25 DIAGNOSIS — F329 Major depressive disorder, single episode, unspecified: Secondary | ICD-10-CM

## 2017-01-08 ENCOUNTER — Telehealth: Payer: Self-pay | Admitting: Family Medicine

## 2017-01-08 NOTE — Telephone Encounter (Signed)
Copied from CRM [id number]. Topic: Quick Communication - Rx Refill/Question >> Jan 08, 2017  4:52 PM Darletta MollLander, Lumin L wrote: Has the patient contacted their pharmacy? Yes.   (Agent: If no, request that the patient contact the pharmacy for the refill.) Preferred Pharmacy (with phone number or street name): [facility] [address] - [place], [place] - [address] Agent: Please be advised that RX refills may take up to 3 business days. We ask that you follow-up with your pharmacy. Checking on status of wellbutrin

## 2017-01-08 NOTE — Telephone Encounter (Signed)
Patient called to question if he was out of Wellbutrin, he stated that "he was told by the provider to take 1 tablet daily for 1 week, then increase to 1 tablet bid." This was verified based on the OV in February, 2018. He stated "he was out of the medication and needed the medicine." Medication list still shows initial dosing of 1 tablet daily. Advised patient message will be sent to provider for medication refill, advised of appointment already scheduled for 01/11/17 and encouraged patient to keep that appointment.

## 2017-01-08 NOTE — Telephone Encounter (Signed)
Copied from CRM [id number]. Topic: Quick Communication - See Telephone Encounter >> Jan 08, 2017 12:25 PM Arlyss Gandyichardson, Raelynn Corron N, NT wrote: CRM for notification. See Telephone encounter for: Pt needing refill of wellbutrin. He is out. Has appt scheduled for 01/11/17 at 11am. [facility] [address] [place].  01/08/17.

## 2017-01-10 ENCOUNTER — Other Ambulatory Visit: Payer: Self-pay | Admitting: Emergency Medicine

## 2017-01-10 DIAGNOSIS — F329 Major depressive disorder, single episode, unspecified: Secondary | ICD-10-CM

## 2017-01-10 DIAGNOSIS — F32A Depression, unspecified: Secondary | ICD-10-CM

## 2017-01-10 DIAGNOSIS — F419 Anxiety disorder, unspecified: Principal | ICD-10-CM

## 2017-01-10 MED ORDER — BUPROPION HCL ER (XL) 300 MG PO TB24: 300.0000 mg | ORAL_TABLET | Freq: Every day | ORAL | 0 refills | Status: DC

## 2017-01-10 MED ORDER — BUPROPION HCL ER (XL) 300 MG PO TB24: 300.0000 mg | ORAL_TABLET | Freq: Two times a day (BID) | ORAL | 0 refills | Status: DC

## 2017-01-10 NOTE — Telephone Encounter (Signed)
Called and spoke with patient changing his prescription to 30 tablets. Patient has an appointment tomorrow with David Leon and we will fix it then. Understanding verbalized nothing further needed at this time.

## 2017-01-10 NOTE — Telephone Encounter (Signed)
Called and spoke with patient. Patient has an appointment for tomorrow 01/11/17. Informed patient that a 30 day supply has been sent to pharmacy. Patient requested that [facility] be [facility] in [place]. Nothing further needed at this time.

## 2017-01-10 NOTE — Telephone Encounter (Signed)
Pt calling today and states the pharmacy told him that his insurance will only pay for him to take 1 pill per day not 2. And that the office would need to get this fixed with his insurance company. Please advise.

## 2017-01-11 ENCOUNTER — Encounter: Payer: Self-pay | Admitting: Family Medicine

## 2017-01-11 ENCOUNTER — Ambulatory Visit: Payer: Managed Care, Other (non HMO) | Admitting: Family Medicine

## 2017-01-11 VITALS — BP 102/64 | HR 123 | Temp 97.9°F | Wt 185.5 lb

## 2017-01-11 DIAGNOSIS — E039 Hypothyroidism, unspecified: Secondary | ICD-10-CM

## 2017-01-11 DIAGNOSIS — F329 Major depressive disorder, single episode, unspecified: Secondary | ICD-10-CM | POA: Diagnosis not present

## 2017-01-11 DIAGNOSIS — F419 Anxiety disorder, unspecified: Secondary | ICD-10-CM | POA: Diagnosis not present

## 2017-01-11 DIAGNOSIS — E109 Type 1 diabetes mellitus without complications: Secondary | ICD-10-CM

## 2017-01-11 LAB — HEMOGLOBIN A1C: HEMOGLOBIN A1C: 12 % — AB (ref 4.6–6.5)

## 2017-01-11 LAB — TSH: TSH: 1.12 u[IU]/mL (ref 0.35–4.50)

## 2017-01-11 MED ORDER — BUPROPION HCL ER (XL) 300 MG PO TB24: 300.0000 mg | ORAL_TABLET | Freq: Every day | ORAL | 1 refills | Status: DC

## 2017-01-11 NOTE — Progress Notes (Signed)
   Subjective:    Patient ID: David Leon, male    DOB: 04/23/1990, 26 y.o.   MRN: [id number]  HPI This is a 26 yo male who presents today for follow up of DM, anxiety/depression. Mood and anxiety have been bad recently with increased stress at work.  He does feel like the bupropion helps and it has has helped decrease smoking urge. Sleeping ok.  Blood sugars running 170s. Taking basaglar 50 units in morning and 30 at night.  He has not been eating well.  He has not been to see Dr. Everardo AllEllison recently. Has been taking levothyroxine daily.   He denies fever/chills, chest pain/shortness of breath/lower extremity swelling, numbness/tingling/pain of hands or feet.  Past Medical History:  Diagnosis Date  . Diabetes mellitus without complication (HCC)   . Diabetic ketoacidosis without coma associated with type 1 diabetes mellitus (HCC)   . Hyperlipidemia 09/11/2014  . Thrombocytopenia (HCC) 09/10/2014  . Thyroid disease    No past surgical history on file. Family History  Problem Relation Age of Onset  . Diabetes Father    Social History   Tobacco Use  . Smoking status: Current Every Day Smoker  . Smokeless tobacco: Never Used  Substance Use Topics  . Alcohol use: No  . Drug use: No      Review of Systems Per HPI    Objective:   Physical Exam  Constitutional: He is oriented to person, place, and time. He appears well-developed and well-nourished. No distress.  HENT:  Head: Normocephalic and atraumatic.  Eyes: Conjunctivae are normal.  Cardiovascular: Normal rate, regular rhythm and normal heart sounds.  Pulmonary/Chest: Effort normal and breath sounds normal.  Musculoskeletal: He exhibits no edema.  Neurological: He is alert and oriented to person, place, and time.  Skin: Skin is warm and dry. He is not diaphoretic.  Psychiatric: He has a normal mood and affect. His behavior is normal. Judgment and thought content normal.  Vitals reviewed.     BP 102/64 (BP  Location: Left Arm, Patient Position: Sitting, Cuff Size: Normal)   Pulse (!) 123   Temp 97.9 F (36.6 C) (Oral)   Wt 185 lb 8 oz (84.1 kg)   SpO2 97%   BMI 25.87 kg/m  Wt Readings from Last 3 Encounters:  01/11/17 185 lb 8 oz (84.1 kg)  05/16/16 199 lb (90.3 kg)  03/13/16 194 lb (88 kg)       Assessment & Plan:  1. Type 1 diabetes mellitus without complication (HCC) -Encouraged him to schedule follow-up with endocrinology - Hemoglobin A1c  2. Acquired hypothyroidism - TSH  3. Anxiety and depression - Encouraged self care, regular exercise, healthy food choices and adequate sleep - buPROPion (WELLBUTRIN XL) 300 MG 24 hr tablet; Take 1 tablet (300 mg total) by mouth daily.  Dispense: 90 tablet; Refill: 1 - Follow up in 6 months  Olean Reeeborah Gessner, FNP-BC  [facility], [facility]  01/11/2017 1:32 PM

## 2017-01-11 NOTE — Patient Instructions (Addendum)
Double check your bupropion bottle to make sure it is the XL (extended release) this should be once a day.   Please follow up in 6 months

## 2017-01-16 ENCOUNTER — Other Ambulatory Visit: Payer: Self-pay | Admitting: Family Medicine

## 2017-01-16 ENCOUNTER — Telehealth: Payer: Self-pay | Admitting: Family Medicine

## 2017-01-16 DIAGNOSIS — E1165 Type 2 diabetes mellitus with hyperglycemia: Secondary | ICD-10-CM

## 2017-01-16 DIAGNOSIS — Z794 Long term (current) use of insulin: Principal | ICD-10-CM

## 2017-01-16 MED ORDER — INSULIN LISPRO 100 UNIT/ML (KWIKPEN): 10.0000 [IU] | PEN_INJECTOR | Freq: Three times a day (TID) | SUBCUTANEOUS | 11 refills | Status: DC

## 2017-01-16 MED ORDER — INSULIN PEN NEEDLE 32G X 4 MM MISC
1.0000 [IU] | 11 refills | Status: DC | PRN
Start: 2017-01-16 — End: 2017-01-17

## 2017-01-16 MED ORDER — FREESTYLE LIBRE 14 DAY SENSOR MISC: 1.0000 [IU] | 11 refills | Status: DC

## 2017-01-16 MED ORDER — FREESTYLE LIBRE 14 DAY READER DEVI: 1.0000 [IU] | 0 refills | Status: DC

## 2017-01-16 MED ORDER — INSULIN PEN NEEDLE 31G X 4 MM MISC: 1.0000 [IU] | 11 refills | Status: DC | PRN

## 2017-01-16 NOTE — Telephone Encounter (Signed)
CRM for notification. See Telephone encounter for:   01/16/17.  Caller name: Ree KidaJack  Relation to pt: [facility]  Call back number: [phone]  Pharmacy: [facility] [address] - [place], [place] - [address] [phone]) [phone] (Fax)    Reason for call:  Insulin Pen Needle 31G X 4 MM MISC pharmacy would like to know if 32 gauge needle can be prescribed due to 31G not being available, please advise

## 2017-01-16 NOTE — Telephone Encounter (Signed)
New prescription for 32 gauge sent to pharmacy.

## 2017-01-17 ENCOUNTER — Other Ambulatory Visit: Payer: Self-pay | Admitting: Emergency Medicine

## 2017-01-17 MED ORDER — INSULIN PEN NEEDLE 32G X 4 MM MISC: 11 refills | Status: DC

## 2017-01-19 ENCOUNTER — Encounter (HOSPITAL_COMMUNITY): Payer: Self-pay | Admitting: Emergency Medicine

## 2017-01-19 ENCOUNTER — Other Ambulatory Visit: Payer: Self-pay

## 2017-01-19 ENCOUNTER — Emergency Department (HOSPITAL_COMMUNITY)
Admission: EM | Admit: 2017-01-19 | Discharge: 2017-01-19 | Disposition: A | Discharge status: Home / Self Care | Payer: Managed Care, Other (non HMO) | Attending: Emergency Medicine | Admitting: Emergency Medicine

## 2017-01-19 ENCOUNTER — Emergency Department (HOSPITAL_COMMUNITY): Payer: Managed Care, Other (non HMO)

## 2017-01-19 DIAGNOSIS — R220 Localized swelling, mass and lump, head: Secondary | ICD-10-CM | POA: Diagnosis not present

## 2017-01-19 DIAGNOSIS — E109 Type 1 diabetes mellitus without complications: Secondary | ICD-10-CM | POA: Insufficient documentation

## 2017-01-19 DIAGNOSIS — X58XXXA Exposure to other specified factors, initial encounter: Secondary | ICD-10-CM | POA: Diagnosis not present

## 2017-01-19 DIAGNOSIS — S0083XA Contusion of other part of head, initial encounter: Secondary | ICD-10-CM

## 2017-01-19 DIAGNOSIS — Z794 Long term (current) use of insulin: Secondary | ICD-10-CM | POA: Insufficient documentation

## 2017-01-19 DIAGNOSIS — Y999 Unspecified external cause status: Secondary | ICD-10-CM | POA: Diagnosis not present

## 2017-01-19 DIAGNOSIS — S01511A Laceration without foreign body of lip, initial encounter: Secondary | ICD-10-CM | POA: Diagnosis not present

## 2017-01-19 DIAGNOSIS — F17210 Nicotine dependence, cigarettes, uncomplicated: Secondary | ICD-10-CM | POA: Diagnosis not present

## 2017-01-19 DIAGNOSIS — E039 Hypothyroidism, unspecified: Secondary | ICD-10-CM | POA: Insufficient documentation

## 2017-01-19 DIAGNOSIS — R739 Hyperglycemia, unspecified: Secondary | ICD-10-CM

## 2017-01-19 DIAGNOSIS — Y939 Activity, unspecified: Secondary | ICD-10-CM | POA: Insufficient documentation

## 2017-01-19 DIAGNOSIS — Y929 Unspecified place or not applicable: Secondary | ICD-10-CM | POA: Diagnosis not present

## 2017-01-19 DIAGNOSIS — Z79899 Other long term (current) drug therapy: Secondary | ICD-10-CM | POA: Insufficient documentation

## 2017-01-19 LAB — CBG MONITORING, ED: Glucose-Capillary: 368 mg/dL — ABNORMAL HIGH (ref 65–99)

## 2017-01-19 MED ORDER — LIDOCAINE-EPINEPHRINE-TETRACAINE (LET) SOLUTION
3.0000 mL | Freq: Once | NASAL | Status: AC
  Administered 2017-01-19: 07:00:00 3 mL via TOPICAL
  Filled 2017-01-19: qty 3

## 2017-01-19 MED ORDER — LIDOCAINE HCL (PF) 1 % IJ SOLN
5.0000 mL | Freq: Once | INTRAMUSCULAR | Status: AC
  Administered 2017-01-19: 5 mL
  Filled 2017-01-19: qty 5
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
## 2017-01-19 NOTE — ED Notes (Signed)
Patient unable to sign.  

## 2017-01-25 ENCOUNTER — Ambulatory Visit: Payer: Managed Care, Other (non HMO) | Admitting: Family Medicine

## 2017-01-25 VITALS — BP 108/76 | HR 87 | Temp 97.6°F | Wt 194.2 lb

## 2017-01-25 DIAGNOSIS — Z4802 Encounter for removal of sutures: Secondary | ICD-10-CM | POA: Diagnosis not present

## 2017-01-25 DIAGNOSIS — E1065 Type 1 diabetes mellitus with hyperglycemia: Secondary | ICD-10-CM | POA: Diagnosis not present

## 2017-01-25 NOTE — Patient Instructions (Signed)
Suture Removal, Care After Refer to this sheet in the next few weeks. These instructions provide you with information on caring for yourself after your procedure. Your health care provider may also give you more specific instructions. Your treatment has been planned according to current medical practices, but problems sometimes occur. Call your health care provider if you have any problems or questions after your procedure. What can I expect after the procedure? After your stitches (sutures) are removed, it is typical to have the following:  Some discomfort and swelling in the wound area.  Slight redness in the area.  Follow these instructions at home:  If you have skin adhesive strips over the wound area, do not take the strips off. They will fall off on their own in a few days. If the strips remain in place after 14 days, you may remove them.  Change any bandages (dressings) at least once a day or as directed by your health care provider. If the bandage sticks, soak it off with warm, soapy water.  Apply cream or ointment only as directed by your health care provider. If using cream or ointment, wash the area with soap and water 2 times a day to remove all the cream or ointment. Rinse off the soap and pat the area dry with a clean towel.  Keep the wound area dry and clean. If the bandage becomes wet or dirty, or if it develops a bad smell, change it as soon as possible.  Continue to protect the wound from injury.  Use sunscreen when out in the sun. New scars become sunburned easily. Contact a health care provider if:  You have increasing redness, swelling, or pain in the wound.  You see pus coming from the wound.  You have a fever.  You notice a bad smell coming from the wound or dressing.  Your wound breaks open (edges not staying together). This information is not intended to replace advice given to you by your health care provider. Make sure you discuss any questions you have  with your health care provider. Document Released: 10/03/2000 Document Revised: 06/16/2015 Document Reviewed: 08/20/2012 Elsevier Interactive Patient Education  2017 [employer]  
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
## 2017-01-27 ENCOUNTER — Encounter: Payer: Self-pay | Admitting: Family Medicine

## 2017-01-30 ENCOUNTER — Encounter: Payer: Self-pay | Admitting: Family Medicine

## 2017-02-01 ENCOUNTER — Other Ambulatory Visit: Payer: Self-pay | Admitting: Family Medicine

## 2017-04-06 ENCOUNTER — Other Ambulatory Visit: Payer: Self-pay | Admitting: Endocrinology

## 2017-04-08 ENCOUNTER — Encounter: Payer: Self-pay | Admitting: Family Medicine

## 2017-04-08 ENCOUNTER — Telehealth: Payer: Self-pay | Admitting: Endocrinology

## 2017-04-08 ENCOUNTER — Ambulatory Visit: Payer: Managed Care, Other (non HMO) | Admitting: Family Medicine

## 2017-04-08 VITALS — BP 122/72 | HR 82 | Temp 97.4°F | Wt 201.0 lb

## 2017-04-08 DIAGNOSIS — Z113 Encounter for screening for infections with a predominantly sexual mode of transmission: Secondary | ICD-10-CM | POA: Diagnosis not present

## 2017-04-08 DIAGNOSIS — E039 Hypothyroidism, unspecified: Secondary | ICD-10-CM | POA: Diagnosis not present

## 2017-04-08 DIAGNOSIS — E109 Type 1 diabetes mellitus without complications: Secondary | ICD-10-CM

## 2017-04-08 DIAGNOSIS — R5383 Other fatigue: Secondary | ICD-10-CM

## 2017-04-08 DIAGNOSIS — E785 Hyperlipidemia, unspecified: Secondary | ICD-10-CM

## 2017-04-08 LAB — CBC WITH DIFFERENTIAL/PLATELET
BASOS ABS: 0 10*3/uL (ref 0.0–0.1)
BASOS PCT: 0.4 % (ref 0.0–3.0)
Eosinophils Absolute: 0 10*3/uL (ref 0.0–0.7)
Eosinophils Relative: 0.4 % (ref 0.0–5.0)
HEMATOCRIT: 44.4 % (ref 39.0–52.0)
Hemoglobin: 15.7 g/dL (ref 13.0–17.0)
LYMPHS PCT: 50.3 % — AB (ref 12.0–46.0)
Lymphs Abs: 1.4 10*3/uL (ref 0.7–4.0)
MCHC: 35.3 g/dL (ref 30.0–36.0)
MCV: 88.3 fl (ref 78.0–100.0)
MONOS PCT: 8.6 % (ref 3.0–12.0)
Monocytes Absolute: 0.2 10*3/uL (ref 0.1–1.0)
NEUTROS ABS: 1.1 10*3/uL — AB (ref 1.4–7.7)
Neutrophils Relative %: 40.3 % — ABNORMAL LOW (ref 43.0–77.0)
PLATELETS: 138 10*3/uL — AB (ref 150.0–400.0)
RBC: 5.03 Mil/uL (ref 4.22–5.81)
RDW: 12 % (ref 11.5–15.5)
WBC: 2.8 10*3/uL — ABNORMAL LOW (ref 4.0–10.5)

## 2017-04-08 LAB — COMPREHENSIVE METABOLIC PANEL
ALT: 15 U/L (ref 0–53)
AST: 14 U/L (ref 0–37)
Albumin: 4.4 g/dL (ref 3.5–5.2)
Alkaline Phosphatase: 54 U/L (ref 39–117)
BILIRUBIN TOTAL: 0.3 mg/dL (ref 0.2–1.2)
BUN: 12 mg/dL (ref 6–23)
CALCIUM: 9 mg/dL (ref 8.4–10.5)
CO2: 29 meq/L (ref 19–32)
Chloride: 100 mEq/L (ref 96–112)
Creatinine, Ser: 0.8 mg/dL (ref 0.40–1.50)
GFR: 122.91 mL/min (ref >60.00)
Glucose, Bld: 173 mg/dL — ABNORMAL HIGH (ref 70–99)
Potassium: 4 mEq/L (ref 3.5–5.1)
Sodium: 137 mEq/L (ref 135–145)
Total Protein: 7.1 g/dL (ref 6.0–8.3)

## 2017-04-08 LAB — TSH: TSH: 3.51 u[IU]/mL (ref 0.35–4.50)

## 2017-04-08 LAB — HEMOGLOBIN A1C: Hgb A1c MFr Bld: 10.5 % — ABNORMAL HIGH (ref 4.6–6.5)

## 2017-04-08 MED ORDER — BASAGLAR KWIKPEN 100 UNIT/ML ~~LOC~~ SOPN: PEN_INJECTOR | SUBCUTANEOUS | 2 refills | Status: DC

## 2017-04-08 NOTE — Progress Notes (Signed)
Subjective:    Patient ID: David Leon, male    DOB: August 21, 1990, 27 y.o.   MRN: [id number]  HPI This is a 27 yo male who presents today with increased fatigue x 2 months. Sleeping well, but doesn't feel rested. Sleeping 10-12 hours a night, doesn't feel engaged, got a negative work review, stating he has been distant, which is not like him. Has not had the energy to exercise. Does not recall a precipitating illness or event. Doesn't think he has ever had EBV. Doesn't feel depressed or anxious. Is ramping up for busy work season. Continues to travel a lot for work. Reports that he has been taking levothyroxine daily.   Has been checking blood sugars and they have been running 80-90s fasting, afternoon 140s. Has been using humalog 10 units once a day. Using insulin glargine 40 units. Decreased urinary frequency, feels like his sugars have been much better controlled. Is working on getting follow up appointment with Dr. Everardo All. Never picked up Libre glucose monitor but is still interested.   Past Medical History:  Diagnosis Date  . Diabetes mellitus without complication (HCC)   . Diabetic ketoacidosis without coma associated with type 1 diabetes mellitus (HCC)   . Hyperlipidemia 09/11/2014  . Thrombocytopenia (HCC) 09/10/2014  . Thyroid disease    No past surgical history on file. Family History  Problem Relation Age of Onset  . Diabetes Father    Social History   Tobacco Use  . Smoking status: Former Games developer  . Smokeless tobacco: Never Used  Substance Use Topics  . Alcohol use: No  . Drug use: No      Review of Systems  Constitutional: Positive for diaphoresis (night sweats for last 1-2 months) and fatigue. Negative for fever and unexpected weight change.  HENT: Positive for rhinorrhea (occasional, good relief with OTC sinus medication). Negative for sore throat.   Respiratory: Negative for cough, shortness of breath and wheezing.   Cardiovascular: Negative for chest pain,  palpitations and leg swelling.  Gastrointestinal: Negative for abdominal pain, blood in stool, constipation, diarrhea, nausea and vomiting.  Genitourinary: Negative for difficulty urinating, dysuria and hematuria.  Musculoskeletal: Positive for myalgias (occasional).  Neurological: Negative for dizziness, light-headedness and headaches.  Psychiatric/Behavioral: Negative for dysphoric mood. The patient is not nervous/anxious.        Objective:   Physical Exam Physical Exam  Constitutional: Oriented to person, place, and time. He appears well-developed and well-nourished.  HENT:  Head: Normocephalic and atraumatic.  Eyes: Conjunctivae are normal.  Neck: Normal range of motion. Neck supple.  Cardiovascular: Normal rate, regular rhythm and normal heart sounds.   Pulmonary/Chest: Effort normal and breath sounds normal.  Musculoskeletal: Normal range of motion.  Neurological: Alert and oriented to person, place, and time.  Skin: Skin is warm and dry.  Psychiatric: Normal mood and affect. Behavior is normal. Judgment and thought content normal.  Vitals reviewed.  BP 122/72   Pulse 82   Temp (!) 97.4 F (36.3 C) (Oral)   Wt 201 lb (91.2 kg)   SpO2 97%   BMI 27.26 kg/m  Wt Readings from Last 3 Encounters:  04/08/17 201 lb (91.2 kg)  01/25/17 194 lb 4 oz (88.1 kg)  01/19/17 185 lb (83.9 kg)      Assessment & Plan:  1. Other fatigue - TSH - Epstein-Barr virus VCA antibody panel  2. Acquired hypothyroidism - TSH  3. Hyperlipidemia, unspecified hyperlipidemia type  - Comprehensive metabolic panel  4. Type 1 diabetes  mellitus without complications (HCC) - Hemoglobin A1c - CBC with Differential - Comprehensive metabolic panel - discussed Libre monitor and I had previously sent in a prescription. He is still interested and will let me know if he needs a new prescription sent in .   5. Screening for STD (sexually transmitted disease) - GC/Chlamydia Probe Amp([facility]) - HIV  antibody - RPR   Olean Ree, FNP-BC  [place] [facility], [facility]  04/08/2017 12:03 PM

## 2017-04-08 NOTE — Patient Instructions (Signed)
Good to see you today, I'll be in touch with your lab results  Increase exercise to 15-20 minutes most days  Try to limit sleep to 9 hours or less a night

## 2017-04-08 NOTE — Telephone Encounter (Signed)
[facility] of  [facility]lar [facility]   [facility] [address] - [place], [place] - [address]

## 2017-04-08 NOTE — Telephone Encounter (Signed)
Refill submitted. 

## 2017-04-15 ENCOUNTER — Encounter: Payer: Self-pay | Admitting: Family Medicine

## 2017-04-15 ENCOUNTER — Telehealth: Payer: Self-pay | Admitting: Infectious Disease

## 2017-04-15 ENCOUNTER — Ambulatory Visit: Payer: Managed Care, Other (non HMO) | Admitting: Family Medicine

## 2017-04-15 VITALS — BP 142/88 | HR 105 | Temp 98.5°F | Wt 207.2 lb

## 2017-04-15 DIAGNOSIS — B20 Human immunodeficiency virus [HIV] disease: Secondary | ICD-10-CM | POA: Diagnosis not present

## 2017-04-15 DIAGNOSIS — E1065 Type 1 diabetes mellitus with hyperglycemia: Secondary | ICD-10-CM

## 2017-04-15 DIAGNOSIS — R899 Unspecified abnormal finding in specimens from other organs, systems and tissues: Secondary | ICD-10-CM | POA: Diagnosis not present

## 2017-04-15 NOTE — Telephone Encounter (Signed)
NOTED that your patient has positive HIV test on first part of the test but discrim assay neg, RNA qualitiative is pending  I am WORRIED that he may have ACUTE HIV based on fact you were working him up for mono.  The qualitative RNA can take up to a week to turn around.  Do you think he would come back in for an HIV quant RNA that would turn around in 2 days or so  I also wonder if he would be a candidate for PrEP if HIV negative.  If he has acute HIV he will be very infectious to sexual partners with a very high viral load.  We could also bring him to our clinic but it might go more smoothly if you reach out to him first

## 2017-04-15 NOTE — Patient Instructions (Addendum)
Please increase your Humalog insulin to 10 units 3 times a day with a meal if blood sugar reading is over 200- you need to check before giving insulin.   Schedule follow up with Dr. Everardo All  I will notify you of results of test

## 2017-04-15 NOTE — Progress Notes (Signed)
   Subjective:    Patient ID: David Leon, male    DOB: 07/30/1990, 27 y.o.   MRN: [id number]  HPI This is a 27 yo male who presents today to discuss lab results.   Fatigue- feeling better since last visit 04/08/17. Labs from that visit revealed reactive HIV with negative HIV 1/2 AB. Discussed findings with him and as recommended by Dr.Van Dam, HIV quant RNA will be drawn. Patient has not been sexually active since 12/18, has had 8 lifetime partners. Sex with males. No recent rash or viral syndrome symptoms.   DM type 1- continued hyperglycemia. Patient only taking Humalog once a day, discussed importance of checking his blood sugar and taking 3/x day. He plans to schedule an appointment with his endocrinologist, Dr. Everardo AllEllison.    Past Medical History:  Diagnosis Date  . Diabetes mellitus without complication (HCC)   . Diabetic ketoacidosis without coma associated with type 1 diabetes mellitus (HCC)   . Hyperlipidemia 09/11/2014  . Thrombocytopenia (HCC) 09/10/2014  . Thyroid disease    No past surgical history on file. Family History  Problem Relation Age of Onset  . Diabetes Father    Social History   Tobacco Use  . Smoking status: Former Games developermoker  . Smokeless tobacco: Never Used  Substance Use Topics  . Alcohol use: No  . Drug use: No      Review of Systems Per HPI    Objective:   Physical Exam Physical Exam  Vitals reviewed. Constitutional: Oriented to person, place, and time. Appears well-developed and well-nourished.  HENT:  Head: Normocephalic and atraumatic.  Eyes: Conjunctivae are normal.  Cardiovascular: Normal rate.   Pulmonary/Chest: Effort normal.  Neurological: Alert and oriented to person, place, and time.  Skin: Skin is warm and dry.  Psychiatric: Normal mood and affect. Behavior is normal. Judgment and thought content normal.      BP (!) 142/88   Pulse (!) 105   Temp 98.5 F (36.9 C) (Oral)   Wt 207 lb 4 oz (94 kg)   SpO2 97%   BMI 28.11  kg/m  Wt Readings from Last 3 Encounters:  04/15/17 207 lb 4 oz (94 kg)  04/08/17 201 lb (91.2 kg)  01/25/17 194 lb 4 oz (88.1 kg)       Assessment & Plan:  1. Abnormal laboratory test result - discussed lab results and need for further testing - HIV 1 RNA quant-no reflex-bld  2. Uncontrolled type 1 diabetes mellitus with hyperglycemia (HCC) - discussed importance of taking humalog insulin 3 times a day and checking blood sugar prior to administration - he will schedule follow up with Dr. Diona FantiEllison   Deborah Gessner, FNP-BC   [facility], [facility]  04/15/2017 10:41 AM

## 2017-04-15 NOTE — Telephone Encounter (Signed)
Patient coming in today to discuss lab results and have additional testing.

## 2017-04-16 ENCOUNTER — Other Ambulatory Visit: Payer: Self-pay

## 2017-04-16 MED ORDER — LEVOTHYROXINE SODIUM 175 MCG PO TABS: 175.0000 ug | ORAL_TABLET | Freq: Every day | ORAL | 11 refills | Status: DC

## 2017-04-18 ENCOUNTER — Telehealth: Payer: Self-pay | Admitting: Family Medicine

## 2017-04-18 ENCOUNTER — Telehealth: Payer: Self-pay | Admitting: Infectious Disease

## 2017-04-18 LAB — HIV-1 RNA QUANT-NO REFLEX-BLD
HIV 1 RNA Quant: 64000 copies/mL — ABNORMAL HIGH
HIV-1 RNA Quant, Log: 4.81 Log copies/mL — ABNORMAL HIGH

## 2017-04-18 NOTE — Telephone Encounter (Signed)
Called and notified patient of positive HIV RNA Quantitative results. [facility]. Urgent referral placed and patient was instructed to call if any further questions or concerns.

## 2017-04-18 NOTE — Addendum Note (Signed)
Addended by: Olean ReeGESSNER, [name] on: 04/18/2017 09:24 AM   Modules accepted: Orders

## 2017-04-18 NOTE — Telephone Encounter (Signed)
I took patients info and gave to [employer] Leon and [employer] Hurt via secure email. Chyrl Civ[place]Joann is great at tracking and getting patients into care promptly throughout the [place]. Hopefully they can get him in this week. Too bad he didn't want to be seen here. We even have an acute study (that [facility] also has though timing wise Friday is not easy to coordinate ) He needs to start on ARV ASAP

## 2017-04-19 ENCOUNTER — Other Ambulatory Visit: Payer: Self-pay

## 2017-04-19 NOTE — Telephone Encounter (Signed)
Spoke with patient this morning and he has an [facility] to [facility].

## 2017-04-22 LAB — HIV-1/2 AB - DIFFERENTIATION
HIV-1 antibody: NEGATIVE
HIV-2 Ab: NEGATIVE

## 2017-04-22 LAB — RPR: RPR: NONREACTIVE

## 2017-04-22 LAB — HIV ANTIBODY (ROUTINE TESTING W REFLEX): HIV 1&2 Ab, 4th Generation: REACTIVE — AB

## 2017-04-22 LAB — EPSTEIN-BARR VIRUS VCA ANTIBODY PANEL
EBV NA IgG: >600 U/mL — ABNORMAL HIGH
EBV VCA IgG: 66 U/mL — ABNORMAL HIGH

## 2017-04-22 LAB — HIV-1 RNA, QUALITATIVE, TMA: HIV-1 RNA, QUAL: DETECTED — AB

## 2017-04-22 LAB — C. TRACHOMATIS/N. GONORRHOEAE RNA
C. trachomatis RNA, TMA: NOT DETECTED
N. GONORRHOEAE RNA, TMA: NOT DETECTED

## 2017-04-25 ENCOUNTER — Telehealth: Payer: Self-pay | Admitting: Endocrinology

## 2017-04-25 ENCOUNTER — Other Ambulatory Visit: Payer: Self-pay

## 2017-04-25 NOTE — Telephone Encounter (Signed)
Patient has a follow up 4/16, he would like to know if he will be able to get a refill for the  Insulin Glargine (BASAGLAR KWIKPEN) 100 UNIT/ML SOPN  He states he has one pen left.      [facility] [address] - [place], [place] - [address]

## 2017-04-25 NOTE — Telephone Encounter (Signed)
I spoke with patient & told him that there was a prescription sent back on 04/08/17 for basaglar pens with 2 refills. This should be enough to last him until his appointment here.

## 2017-04-30 LAB — LIPID PANEL
CHOLESTEROL: 148 (ref 0–200)
HDL: 41 (ref 35–70)
LDL CALC: 98
Triglycerides: 60 (ref 40–160)

## 2017-04-30 LAB — CBC AND DIFFERENTIAL
HCT: 49 (ref 41–53)
Hemoglobin: 16.8 (ref 13.5–17.5)
PLATELETS: 302 (ref 150–399)
WBC: 5.6

## 2017-05-07 ENCOUNTER — Ambulatory Visit: Payer: Managed Care, Other (non HMO) | Admitting: Endocrinology

## 2017-05-07 DIAGNOSIS — Z2089 Contact with and (suspected) exposure to other communicable diseases: Secondary | ICD-10-CM

## 2017-05-22 ENCOUNTER — Encounter: Payer: Self-pay | Admitting: Family Medicine

## 2017-05-28 ENCOUNTER — Other Ambulatory Visit: Payer: Self-pay | Admitting: Endocrinology

## 2017-06-05 ENCOUNTER — Ambulatory Visit: Payer: Managed Care, Other (non HMO) | Admitting: Endocrinology

## 2017-06-05 DIAGNOSIS — Z0289 Encounter for other administrative examinations: Secondary | ICD-10-CM

## 2017-06-20 ENCOUNTER — Encounter: Payer: Self-pay | Admitting: Family Medicine

## 2017-06-21 ENCOUNTER — Other Ambulatory Visit: Payer: Self-pay | Admitting: Emergency Medicine

## 2017-06-26 ENCOUNTER — Other Ambulatory Visit: Payer: Self-pay | Admitting: Family Medicine

## 2017-06-26 ENCOUNTER — Telehealth: Payer: Self-pay | Admitting: Family Medicine

## 2017-06-26 MED ORDER — BASAGLAR KWIKPEN 100 UNIT/ML ~~LOC~~ SOPN: PEN_INJECTOR | SUBCUTANEOUS | 2 refills | Status: DC

## 2017-06-26 NOTE — Telephone Encounter (Signed)
Copied from CRM [id number]. Topic: Quick Communication - Rx Refill/Question >> Jun 26, 2017  1:19 PM Cipriano BunkerLambe, Annette S wrote: Medication:  Insulin Glargine (BASAGLAR KWIKPEN) 100 UNIT/ML SOPN  (90 days or refills)  Pt. Is flying out and needs to be at airport at 6.  He has been trying to get this done for days.  Has the patient contacted their pharmacy? Yes.   (Agent: If no, request that the patient contact the pharmacy for the refill.) (Agent: If yes, when and what did the pharmacy advise?)  Preferred Pharmacy (with phone number or street name):   [facility] [address] - [place], [place] - [address] [place] [id number] Phone: [phone] Fax: [phone]    Agent: Please be advised that RX refills may take up to 3 business days. We ask that you follow-up with your pharmacy.

## 2017-06-27 NOTE — Telephone Encounter (Signed)
Rx refilled by provider 6/5

## 2017-06-28 ENCOUNTER — Other Ambulatory Visit: Payer: Self-pay

## 2017-07-12 ENCOUNTER — Encounter: Payer: Self-pay | Admitting: Family Medicine

## 2017-07-12 ENCOUNTER — Ambulatory Visit: Payer: Managed Care, Other (non HMO) | Admitting: Family Medicine

## 2017-07-12 VITALS — BP 116/78 | HR 87 | Temp 97.9°F | Wt 203.0 lb

## 2017-07-12 DIAGNOSIS — R5383 Other fatigue: Secondary | ICD-10-CM | POA: Diagnosis not present

## 2017-07-12 DIAGNOSIS — E1065 Type 1 diabetes mellitus with hyperglycemia: Secondary | ICD-10-CM

## 2017-07-12 LAB — POCT GLYCOSYLATED HEMOGLOBIN (HGB A1C): HEMOGLOBIN A1C: 10.5 % — AB (ref 4.0–5.6)

## 2017-07-12 NOTE — Patient Instructions (Addendum)
Please use your humalog at lunch  Try to squeeze in 10-15 minutes of walking in the afternoon   Follow up in 3 months, sooner if no improvement in fatigue

## 2017-07-12 NOTE — Progress Notes (Signed)
Subjective:    Patient ID: David Leon, male    DOB: 12/01/1990, 27 y.o.   MRN: [id number]  HPI This is a 27 yo male who presents today for follow up of DM type 2, recent HIV diagnosis.  Currently taking humalog 10 units prn blood sugar over 150 BID, sometimes needs at night. Taking basaglar 50 units in am. Blood sugars running lower in am, awakening early in am and blood sugar 80-90, raises throughout day, 230- 320s, struggles to eat well and when on vacation. Has been taking humalog in the evening with dinner after exercising.  Has an upcoming appointment with endocrine 08/19/17.  Food- little breakfast, lunch- salads with meat, beans, rice, dinner- eats out most nights. Admits to lacking motivation in past, is feeling more motivated.   Depression- feels like wellbutrin not working as well, some increased anxiety; did help with energy and smoking cessation. Still is tired all of the time. Sleeping 8 hours a night, doesn't feel rested. Concerned there is something else going on. Sister with recent lupus diagnosis.  HIV- tolerating treatment. Undetectable viral load. [facility]. Looking for support group as therapy was very expensive.   Past Medical History:  Diagnosis Date  . Diabetes mellitus without complication (HCC)   . Diabetic ketoacidosis without coma associated with type 1 diabetes mellitus (HCC)   . Hyperlipidemia 09/11/2014  . Thrombocytopenia (HCC) 09/10/2014  . Thyroid disease    No past surgical history on file. Family History  Problem Relation Age of Onset  . Diabetes Father    Social History   Tobacco Use  . Smoking status: Former Games developermoker  . Smokeless tobacco: Never Used  Substance Use Topics  . Alcohol use: No  . Drug use: No      Review of Systems Per HPI    Objective:   Physical Exam Physical Exam  Constitutional: Oriented to person, place, and time. He appears well-developed and well-nourished.  HENT:  Head:  Normocephalic and atraumatic.  Eyes: Conjunctivae are normal.  Neck: Normal range of motion. Neck supple.  Cardiovascular: Normal rate, regular rhythm and normal heart sounds.   Pulmonary/Chest: Effort normal and breath sounds normal.  Musculoskeletal: Normal range of motion.  Neurological: Alert and oriented to person, place, and time.  Skin: Skin is warm and dry.  Psychiatric: Normal mood and affect. Behavior is normal. Judgment and thought content normal.  Vitals reviewed.     BP 116/78   Pulse 87   Temp 97.9 F (36.6 C) (Oral)   Wt 203 lb (92.1 kg)   SpO2 97%   BMI 27.53 kg/m  Wt Readings from Last 3 Encounters:  07/12/17 203 lb (92.1 kg)  04/15/17 207 lb 4 oz (94 kg)  04/08/17 201 lb (91.2 kg)   Results for orders placed or performed in visit on 07/12/17  HgB A1c  Result Value Ref Range   Hemoglobin A1C 10.5 (A) 4.0 - 5.6 %   HbA1c POC (<> result, manual entry)  4.0 - 5.6 %   HbA1c, POC (prediabetic range)  5.7 - 6.4 %   HbA1c, POC (controlled diabetic range)  0.0 - 7.0 %       Assessment & Plan:  1. Uncontrolled type 1 diabetes mellitus with hyperglycemia (HCC) - HgB A1c - Poor control, concern for episodes low blood sugar through the night, he will take his humalog at lunch, check his blood sugars more often, add 15 minutes walking daily - follow up  with endocrine as scheduled - he will let me know if blood sugars over 200 more than half of the time  2. Other fatigue - likely multifactorial (hyperglycemia, stress), I will see if he has had labs for autoimmune - encouraged increased exercise, healthier food choices  - follow up in 3 months  Olean Ree, FNP-BC  [place] [facility] at [facility], [facility]  07/18/2017 9:31 AM

## 2017-07-18 ENCOUNTER — Other Ambulatory Visit: Payer: Self-pay | Admitting: Family Medicine

## 2017-07-18 ENCOUNTER — Encounter: Payer: Self-pay | Admitting: Family Medicine

## 2017-07-18 DIAGNOSIS — R5383 Other fatigue: Secondary | ICD-10-CM

## 2017-07-28 ENCOUNTER — Encounter: Payer: Self-pay | Admitting: Family Medicine

## 2017-07-29 ENCOUNTER — Encounter: Payer: Self-pay | Admitting: Family Medicine

## 2017-07-29 ENCOUNTER — Ambulatory Visit: Payer: Managed Care, Other (non HMO) | Admitting: Family Medicine

## 2017-07-29 VITALS — BP 126/86 | HR 114 | Temp 97.9°F | Ht 72.0 in | Wt 211.8 lb

## 2017-07-29 DIAGNOSIS — L0201 Cutaneous abscess of face: Secondary | ICD-10-CM | POA: Diagnosis not present

## 2017-07-29 MED ORDER — DOXYCYCLINE HYCLATE 100 MG PO CAPS: 100.0000 mg | ORAL_CAPSULE | Freq: Two times a day (BID) | ORAL | 0 refills | Status: DC

## 2017-07-29 NOTE — Patient Instructions (Signed)
Keep me posted on how it is doing, if significantly worse, fever or swelling/redness- go to ER Start antibiotic today, take with water and food  Skin Abscess A skin abscess is an infected area on or under your skin that contains a collection of pus and other material. An abscess may also be called a furuncle, carbuncle, or boil. An abscess can occur in or on almost any part of your body. Some abscesses break open (rupture) on their own. Most continue to get worse unless they are treated. The infection can spread deeper into the body and eventually into your blood, which can make you feel ill. Treatment usually involves draining the abscess. What are the causes? An abscess occurs when germs, often bacteria, pass through your skin and cause an infection. This may be caused by:  A scrape or cut on your skin.  A puncture wound through your skin, including a needle injection.  Blocked oil or sweat glands.  Blocked and infected hair follicles.  A cyst that forms beneath your skin (sebaceous cyst) and becomes infected.  What increases the risk? This condition is more likely to develop in people who:  Have a weak body defense system (immune system).  Have diabetes.  Have dry and irritated skin.  Get frequent injections or use illegal IV drugs.  Have a foreign body in a wound, such as a splinter.  Have problems with their lymph system or veins.  What are the signs or symptoms? An abscess may start as a painful, firm bump under the skin. Over time, the abscess may get larger or become softer. Pus may appear at the top of the abscess, causing pressure and pain. It may eventually break through the skin and drain. Other symptoms include:  Redness.  Warmth.  Swelling.  Tenderness.  A sore on the skin.  How is this diagnosed? This condition is diagnosed based on your medical history and a physical exam. A sample of pus may be taken from the abscess to find out what is causing the  infection and what antibiotics can be used to treat it. You also may have:  Blood tests to look for signs of infection or spread of an infection to your blood.  Imaging studies such as ultrasound, CT scan, or MRI if the abscess is deep.  How is this treated? Small abscesses that drain on their own may not need treatment. Treatment for an abscess that does not rupture on its own may include:  Warm compresses applied to the area several times per day.  Incision and drainage. Your health care provider will make an incision to open the abscess and will remove pus and any foreign body or dead tissue. The incision area may be packed with gauze to keep it open for a few days while it heals.  Antibiotic medicines to treat infection. For a severe abscess, you may first get antibiotics through an IV and then change to oral antibiotics.  Follow these instructions at home: Abscess Care  If you have an abscess that has not drained, place a warm, clean, wet washcloth over the abscess several times a day. Do this as told by your health care provider.  Follow instructions from your health care provider about how to take care of your abscess. Make sure you: ? Cover the abscess with a bandage (dressing). ? Change your dressing or gauze as told by your health care provider. ? Wash your hands with soap and water before you change the dressing or  gauze. If soap and water are not available, use hand sanitizer.  Check your abscess every day for signs of a worsening infection. Check for: ? More redness, swelling, or pain. ? More fluid or blood. ? Warmth. ? More pus or a bad smell. Medicines  Take over-the-counter and prescription medicines only as told by your health care provider.  If you were prescribed an antibiotic medicine, take it as told by your health care provider. Do not stop taking the antibiotic even if you start to feel better. General instructions  To avoid spreading the infection: ? Do  not share personal care items, towels, or hot tubs with others. ? Avoid making skin contact with other people.  Keep all follow-up visits as told by your health care provider. This is important. Contact a health care provider if:  You have more redness, swelling, or pain around your abscess.  You have more fluid or blood coming from your abscess.  Your abscess feels warm to the touch.  You have more pus or a bad smell coming from your abscess.  You have a fever.  You have muscle aches.  You have chills or a general ill feeling. Get help right away if:  You have severe pain.  You see red streaks on your skin spreading away from the abscess. This information is not intended to replace advice given to you by your health care provider. Make sure you discuss any questions you have with your health care provider. Document Released: 10/18/2004 Document Revised: 09/04/2015 Document Reviewed: 11/17/2014 Elsevier Interactive Patient Education  [employer].

## 2017-07-29 NOTE — Progress Notes (Signed)
Subjective:    Patient ID: David Leon, male    DOB: Jun 19, 1990, 27 y.o.   MRN: [id number]  HPI This is a 27 yo male who presents today with irritation of his face x 5 days. Area above left side of lip. He thought was an ingrown hair, had white head. He pressed out pus. Area has gotten larger. Has applied heat and ice and has taken ibuprofen. No fever/chills. Swelling down a little today.  He reports blood sugars improving, although he was on vacation last week at the beach. No more lows.   Past Medical History:  Diagnosis Date  . Diabetes mellitus without complication (HCC)   . Diabetic ketoacidosis without coma associated with type 1 diabetes mellitus (HCC)   . Hyperlipidemia 09/11/2014  . Thrombocytopenia (HCC) 09/10/2014  . Thyroid disease    No past surgical history on file. Family History  Problem Relation Age of Onset  . Diabetes Father    Social History   Tobacco Use  . Smoking status: Former Games developer  . Smokeless tobacco: Never Used  Substance Use Topics  . Alcohol use: No  . Drug use: No      Review of Systems Per HPI    Objective:   Physical Exam  Constitutional: He appears well-developed and well-nourished. No distress.  He is wearing long sleeve shirt, vest and long pants.   HENT:  Head: Normocephalic and atraumatic.    Eyes: Conjunctivae are normal.  Neck: Normal range of motion. Neck supple.  Pulmonary/Chest: Effort normal.  Skin: Skin is warm and dry. He is not diaphoretic.  Psychiatric: He has a normal mood and affect. His behavior is normal. Judgment and thought content normal.  Vitals reviewed.     BP 126/86 (BP Location: Right Arm, Patient Position: Sitting, Cuff Size: Normal)   Pulse (!) 114   Temp 97.9 F (36.6 C) (Oral)   Ht 6' (1.829 m)   Wt 211 lb 12 oz (96 kg)   SpO2 98%   BMI 28.72 kg/m  Wt Readings from Last 3 Encounters:  07/29/17 211 lb 12 oz (96 kg)  07/12/17 203 lb (92.1 kg)  04/15/17 207 lb 4 oz (94 kg)         Assessment & Plan:  1. Acute abscess of face - Provided written and verbal information regarding diagnosis and treatment. - Strict RTC/ER precautions reviewed - warm compresses QID for next several days, avoid squeezing - doxycycline (VIBRAMYCIN) 100 MG capsule; Take 1 capsule (100 mg total) by mouth 2 (two) times daily.  Dispense: 14 capsule; Refill: 0 - WOUND CULTURE  Olean Ree, FNP-BC  [facility] at [facility], [facility]  07/29/2017 12:52 PM

## 2017-08-01 LAB — WOUND CULTURE
MICRO NUMBER:: 90805087
SPECIMEN QUALITY:: ADEQUATE

## 2017-08-13 ENCOUNTER — Encounter: Payer: Self-pay | Admitting: Endocrinology

## 2017-08-13 MED ORDER — LEVOTHYROXINE SODIUM 175 MCG PO TABS: 175.0000 ug | ORAL_TABLET | Freq: Every day | ORAL | 0 refills | Status: DC

## 2017-08-19 ENCOUNTER — Encounter: Payer: Self-pay | Admitting: Endocrinology

## 2017-08-19 ENCOUNTER — Ambulatory Visit: Payer: Managed Care, Other (non HMO) | Admitting: Endocrinology

## 2017-08-19 VITALS — BP 116/78 | HR 95 | Ht 72.0 in | Wt 209.8 lb

## 2017-08-19 DIAGNOSIS — E109 Type 1 diabetes mellitus without complications: Secondary | ICD-10-CM

## 2017-08-19 MED ORDER — INSULIN ISOPHANE HUMAN 100 UNIT/ML KWIKPEN: 55.0000 [IU] | PEN_INJECTOR | SUBCUTANEOUS | 5 refills | Status: DC

## 2017-08-19 NOTE — Patient Instructions (Addendum)
Please change the basaglar to "NPH," 55 units each morning. On this type of insulin schedule, you should eat meals on a regular schedule.  If a meal is missed or significantly delayed, your blood sugar could go low.   check your blood sugar 4 times a day: before the 3 meals, and at bedtime.  also check if you have symptoms of your blood sugar being too high or too low.  please keep a record of the readings and bring it to your next appointment here.  You can write it on any piece of paper.  please call us sooner if your blood sugar goes below 70, or if you have a lot of readings over 200.   Please continue the same levothyroxine Please come back for a follow-up appointment in 2 months.

## 2017-08-19 NOTE — Progress Notes (Signed)
Subjective:    Patient ID: David Leon, male    DOB: 12/22/1990, 27 y.o.   MRN: [id number]  HPI Pt returns for f/u of diabetes mellitus:  DM type: 1 Dx'ed: 2016 Complications: none Therapy: insulin since dx DKA: only once, at dx.   Severe hypoglycemia: never Pancreatitis: never Other: he declines multiple daily injections; he says levemir "put me in the ICU" Interval history: no cbg record, but states cbg's vary from 80-200's.  It is in general higher as the day goes on.  pt states he feels well in general. He says he never misses the insulin.  He takes basaglar 50/d, and PRN novolog (but he seldom takes this).   He takes synthroid as rx'ed.  Past Medical History:  Diagnosis Date  . Diabetes mellitus without complication (HCC)   . Diabetic ketoacidosis without coma associated with type 1 diabetes mellitus (HCC)   . Hyperlipidemia 09/11/2014  . Thrombocytopenia (HCC) 09/10/2014  . Thyroid disease     No past surgical history on file.  Social History   Socioeconomic History  . Marital status: Single    Spouse name: Not on file  . Number of children: Not on file  . Years of education: Not on file  . Highest education level: Not on file  Occupational History  . Not on file  Social Needs  . Financial resource strain: Not on file  . Food insecurity:    Worry: Not on file    Inability: Not on file  . Transportation needs:    Medical: Not on file    Non-medical: Not on file  Tobacco Use  . Smoking status: Former Games developermoker  . Smokeless tobacco: Never Used  Substance and Sexual Activity  . Alcohol use: No  . Drug use: No  . Sexual activity: Yes    Birth control/protection: None  Lifestyle  . Physical activity:    Days per week: Not on file    Minutes per session: Not on file  . Stress: Not on file  Relationships  . Social connections:    Talks on phone: Not on file    Gets together: Not on file    Attends religious service: Not on file    Active member of club  or organization: Not on file    Attends meetings of clubs or organizations: Not on file    Relationship status: Not on file  . Intimate partner violence:    Fear of current or ex partner: Not on file    Emotionally abused: Not on file    Physically abused: Not on file    Forced sexual activity: Not on file  Other Topics Concern  . Not on file  Social History Narrative  . Not on file    Current Outpatient Medications on File Prior to Visit  Medication Sig Dispense Refill  . abacavir-dolutegravir-lamiVUDine (TRIUMEQ) 600-50-300 MG tablet Take 1 tablet by mouth daily.    Marland Kitchen. atorvastatin (LIPITOR) 10 MG tablet Take 1 tablet (10 mg total) by mouth daily. 30 tablet 3  . buPROPion (WELLBUTRIN XL) 300 MG 24 hr tablet Take 1 tablet (300 mg total) by mouth daily. 90 tablet 1  . Continuous Blood Gluc Receiver (FREESTYLE LIBRE 14 DAY READER) DEVI 1 Units by Does not apply route continuous. 1 Device 0  . Continuous Blood Gluc Sensor (FREESTYLE LIBRE 14 DAY SENSOR) MISC 1 Units by Does not apply route every 14 (fourteen) days. 2 each 11  . doxycycline (VIBRAMYCIN) 100 MG  capsule Take 1 capsule (100 mg total) by mouth 2 (two) times daily. 14 capsule 0  . glucagon 1 MG injection Inject 1 mg into the muscle once as needed. 1 each 12  . glucose blood (ONETOUCH VERIO) test strip 1 each by Other route 4 (four) times daily. And lancets 4/day 120 each 12  . levothyroxine (SYNTHROID, LEVOTHROID) 175 MCG tablet Take 1 tablet (175 mcg total) by mouth daily before breakfast. 90 tablet 0  . traZODone (DESYREL) 50 MG tablet Take 0.5-1 tablets (25-50 mg total) by mouth at bedtime as needed for sleep. 30 tablet 3   No current facility-administered medications on file prior to visit.     Allergies  Allergen Reactions  . Penicillins Other (See Comments)    Has not had a reaction, just family history of same    Family History  Problem Relation Age of Onset  . Diabetes Father     BP 116/78 (BP Location: Left  Arm, Patient Position: Sitting, Cuff Size: Normal)   Pulse 95   Ht 6' (1.829 m)   Wt 209 lb 12.8 oz (95.2 kg)   SpO2 98%   BMI 28.45 kg/m    Review of Systems Denies LOC    Objective:   Physical Exam VITAL SIGNS:  See vs page GENERAL: no distress Pulses: dorsalis pedis intact bilat.   MSK: no deformity of the feet CV: no leg edema Skin:  no ulcer on the feet.  normal color and temp on the feet. Neuro: sensation is intact to touch on the feet .   Lab Results  Component Value Date   HGBA1C 10.5 (A) 07/12/2017   Lab Results  Component Value Date   CREATININE 0.80 04/08/2017   BUN 12 04/08/2017   NA 137 04/08/2017   K 4.0 04/08/2017   CL 100 04/08/2017   CO2 29 04/08/2017   Lab Results  Component Value Date   TSH 3.51 04/08/2017       Assessment & Plan:  Type 1 DM: The pattern of his cbg's indicates he needs a faster-acting qd insulin.  Missing insulin doses and f/u appts.  We'll try to manage with QD insulin.  Hypothyroidism: well-replaced.   Patient Instructions  Please change the basaglar to "NPH," 55 units each morning. On this type of insulin schedule, you should eat meals on a regular schedule.  If a meal is missed or significantly delayed, your blood sugar could go low.   check your blood sugar 4 times a day: before the 3 meals, and at bedtime.  also check if you have symptoms of your blood sugar being too high or too low.  please keep a record of the readings and bring it to your next appointment here.  You can write it on any piece of paper.  please call us sooner if your blood sugar goes below 70, or if you have a lot of readings over 200.   Please continue the same levothyroxine Please come back for a follow-up appointment in 2 months.

## 2017-10-06 ENCOUNTER — Encounter (HOSPITAL_COMMUNITY): Payer: Self-pay | Admitting: Emergency Medicine

## 2017-10-06 ENCOUNTER — Emergency Department (HOSPITAL_COMMUNITY): Payer: Managed Care, Other (non HMO)

## 2017-10-06 ENCOUNTER — Inpatient Hospital Stay (HOSPITAL_COMMUNITY)
Admission: EM | Admit: 2017-10-06 | Discharge: 2017-10-08 | DRG: 639 | Disposition: A | Discharge status: Home / Self Care | Payer: Managed Care, Other (non HMO) | Attending: Internal Medicine | Admitting: Internal Medicine

## 2017-10-06 ENCOUNTER — Other Ambulatory Visit: Payer: Self-pay

## 2017-10-06 DIAGNOSIS — Z9111 Patient's noncompliance with dietary regimen: Secondary | ICD-10-CM

## 2017-10-06 DIAGNOSIS — E875 Hyperkalemia: Secondary | ICD-10-CM | POA: Diagnosis present

## 2017-10-06 DIAGNOSIS — R000 Tachycardia, unspecified: Secondary | ICD-10-CM | POA: Diagnosis present

## 2017-10-06 DIAGNOSIS — Z720 Tobacco use: Secondary | ICD-10-CM | POA: Diagnosis not present

## 2017-10-06 DIAGNOSIS — E039 Hypothyroidism, unspecified: Secondary | ICD-10-CM | POA: Diagnosis present

## 2017-10-06 DIAGNOSIS — E860 Dehydration: Secondary | ICD-10-CM | POA: Diagnosis present

## 2017-10-06 DIAGNOSIS — E785 Hyperlipidemia, unspecified: Secondary | ICD-10-CM | POA: Diagnosis present

## 2017-10-06 DIAGNOSIS — Z833 Family history of diabetes mellitus: Secondary | ICD-10-CM | POA: Diagnosis not present

## 2017-10-06 DIAGNOSIS — Z8349 Family history of other endocrine, nutritional and metabolic diseases: Secondary | ICD-10-CM | POA: Diagnosis not present

## 2017-10-06 DIAGNOSIS — Z6827 Body mass index (BMI) 27.0-27.9, adult: Secondary | ICD-10-CM | POA: Diagnosis not present

## 2017-10-06 DIAGNOSIS — E669 Obesity, unspecified: Secondary | ICD-10-CM | POA: Diagnosis present

## 2017-10-06 DIAGNOSIS — Z79890 Hormone replacement therapy (postmenopausal): Secondary | ICD-10-CM | POA: Diagnosis not present

## 2017-10-06 DIAGNOSIS — Z880 Allergy status to penicillin: Secondary | ICD-10-CM | POA: Diagnosis not present

## 2017-10-06 DIAGNOSIS — Z21 Asymptomatic human immunodeficiency virus [HIV] infection status: Secondary | ICD-10-CM | POA: Diagnosis present

## 2017-10-06 DIAGNOSIS — E1010 Type 1 diabetes mellitus with ketoacidosis without coma: Secondary | ICD-10-CM | POA: Diagnosis present

## 2017-10-06 DIAGNOSIS — Z794 Long term (current) use of insulin: Secondary | ICD-10-CM

## 2017-10-06 DIAGNOSIS — E876 Hypokalemia: Secondary | ICD-10-CM | POA: Diagnosis not present

## 2017-10-06 DIAGNOSIS — Z842 Family history of other diseases of the genitourinary system: Secondary | ICD-10-CM | POA: Diagnosis not present

## 2017-10-06 DIAGNOSIS — Z79899 Other long term (current) drug therapy: Secondary | ICD-10-CM

## 2017-10-06 LAB — URINALYSIS, ROUTINE W REFLEX MICROSCOPIC
BILIRUBIN URINE: NEGATIVE
Bacteria, UA: NONE SEEN
Glucose, UA: >=500 mg/dL — AB
Ketones, ur: 80 mg/dL — AB
LEUKOCYTES UA: NEGATIVE
Nitrite: NEGATIVE
PROTEIN: 30 mg/dL — AB
Specific Gravity, Urine: 1.022 (ref 1.005–1.030)
pH: 5 (ref 5.0–8.0)

## 2017-10-06 LAB — BASIC METABOLIC PANEL
ANION GAP: 13 (ref 5–15)
Anion gap: 17 — ABNORMAL HIGH (ref 5–15)
Anion gap: 11 (ref 5–15)
BUN: 16 mg/dL (ref 6–20)
BUN: 13 mg/dL (ref 6–20)
BUN: 13 mg/dL (ref 6–20)
CALCIUM: 9 mg/dL (ref 8.9–10.3)
CALCIUM: 9 mg/dL (ref 8.9–10.3)
CHLORIDE: 106 mmol/L (ref 98–111)
CO2: 15 mmol/L — AB (ref 22–32)
CO2: 17 mmol/L — AB (ref 22–32)
CO2: 19 mmol/L — ABNORMAL LOW (ref 22–32)
CREATININE: 1.11 mg/dL (ref 0.61–1.24)
CREATININE: 0.82 mg/dL (ref 0.61–1.24)
Calcium: 8.7 mg/dL — ABNORMAL LOW (ref 8.9–10.3)
Chloride: 106 mmol/L (ref 98–111)
Chloride: 109 mmol/L (ref 98–111)
Creatinine, Ser: 0.89 mg/dL (ref 0.61–1.24)
GFR calc non Af Amer: >60 mL/min (ref >60)
Glucose, Bld: 222 mg/dL — ABNORMAL HIGH (ref 70–99)
Glucose, Bld: 156 mg/dL — ABNORMAL HIGH (ref 70–99)
Glucose, Bld: 109 mg/dL — ABNORMAL HIGH (ref 70–99)
POTASSIUM: 5.1 mmol/L (ref 3.5–5.1)
Potassium: 4.4 mmol/L (ref 3.5–5.1)
Potassium: 4.2 mmol/L (ref 3.5–5.1)
SODIUM: 138 mmol/L (ref 135–145)
SODIUM: 136 mmol/L (ref 135–145)
SODIUM: 139 mmol/L (ref 135–145)

## 2017-10-06 LAB — GLUCOSE, CAPILLARY
GLUCOSE-CAPILLARY: 231 mg/dL — AB (ref 70–99)
GLUCOSE-CAPILLARY: 211 mg/dL — AB (ref 70–99)
GLUCOSE-CAPILLARY: 207 mg/dL — AB (ref 70–99)
GLUCOSE-CAPILLARY: 110 mg/dL — AB (ref 70–99)
Glucose-Capillary: 105 mg/dL — ABNORMAL HIGH (ref 70–99)
Glucose-Capillary: 107 mg/dL — ABNORMAL HIGH (ref 70–99)
Glucose-Capillary: 112 mg/dL — ABNORMAL HIGH (ref 70–99)
Glucose-Capillary: 152 mg/dL — ABNORMAL HIGH (ref 70–99)
Glucose-Capillary: 173 mg/dL — ABNORMAL HIGH (ref 70–99)
Glucose-Capillary: 231 mg/dL — ABNORMAL HIGH (ref 70–99)

## 2017-10-06 LAB — BLOOD GAS, VENOUS
Acid-base deficit: 21 mmol/L — ABNORMAL HIGH (ref 0.0–2.0)
Bicarbonate: 8.8 mmol/L — ABNORMAL LOW (ref 20.0–28.0)
O2 Saturation: 47.5 %
PATIENT TEMPERATURE: 37
pCO2, Ven: 29.9 mmHg — ABNORMAL LOW (ref 44.0–60.0)
pH, Ven: 7.095 — CL (ref 7.250–7.430)
pO2, Ven: 31.4 mmHg — CL (ref 32.0–45.0)

## 2017-10-06 LAB — CBC WITH DIFFERENTIAL/PLATELET
BASOS ABS: 0 10*3/uL (ref 0.0–0.1)
Basophils Relative: 0 %
Eosinophils Absolute: 0 10*3/uL (ref 0.0–0.7)
Eosinophils Relative: 0 %
HCT: 46.8 % (ref 39.0–52.0)
HEMOGLOBIN: 17.2 g/dL — AB (ref 13.0–17.0)
LYMPHS PCT: 6 %
Lymphs Abs: 1.1 10*3/uL (ref 0.7–4.0)
MCH: 33.5 pg (ref 26.0–34.0)
MCHC: 36.8 g/dL — ABNORMAL HIGH (ref 30.0–36.0)
MCV: 91.2 fL (ref 78.0–100.0)
Monocytes Absolute: 0.6 10*3/uL (ref 0.1–1.0)
Monocytes Relative: 3 %
Neutro Abs: 16.9 10*3/uL — ABNORMAL HIGH (ref 1.7–7.7)
Neutrophils Relative %: 91 %
Platelets: 330 10*3/uL (ref 150–400)
RBC: 5.13 MIL/uL (ref 4.22–5.81)
RDW: 11.6 % (ref 11.5–15.5)
WBC: 18.6 10*3/uL — AB (ref 4.0–10.5)

## 2017-10-06 LAB — COMPREHENSIVE METABOLIC PANEL
ALT: 17 U/L (ref 0–44)
AST: 15 U/L (ref 15–41)
Albumin: 4.9 g/dL (ref 3.5–5.0)
Alkaline Phosphatase: 69 U/L (ref 38–126)
Anion gap: 27 — ABNORMAL HIGH (ref 5–15)
BUN: 20 mg/dL (ref 6–20)
CHLORIDE: 103 mmol/L (ref 98–111)
CO2: 7 mmol/L — ABNORMAL LOW (ref 22–32)
Calcium: 9 mg/dL (ref 8.9–10.3)
Creatinine, Ser: 1.35 mg/dL — ABNORMAL HIGH (ref 0.61–1.24)
GFR calc Af Amer: >60 mL/min (ref >60)
GFR calc non Af Amer: >60 mL/min (ref >60)
GLUCOSE: 403 mg/dL — AB (ref 70–99)
POTASSIUM: 5.1 mmol/L (ref 3.5–5.1)
SODIUM: 137 mmol/L (ref 135–145)
Total Bilirubin: 2.1 mg/dL — ABNORMAL HIGH (ref 0.3–1.2)
Total Protein: 8.1 g/dL (ref 6.5–8.1)

## 2017-10-06 LAB — CBG MONITORING, ED
GLUCOSE-CAPILLARY: 512 mg/dL — AB (ref 70–99)
GLUCOSE-CAPILLARY: 428 mg/dL — AB (ref 70–99)
Glucose-Capillary: 553 mg/dL (ref 70–99)
Glucose-Capillary: 320 mg/dL — ABNORMAL HIGH (ref 70–99)
Glucose-Capillary: 281 mg/dL — ABNORMAL HIGH (ref 70–99)

## 2017-10-06 LAB — I-STAT CG4 LACTIC ACID, ED
Lactic Acid, Venous: 4.11 mmol/L (ref 0.5–1.9)
Lactic Acid, Venous: 2.42 mmol/L (ref 0.5–1.9)

## 2017-10-06 LAB — PROTIME-INR
INR: 1.09
Prothrombin Time: 14 seconds (ref 11.4–15.2)

## 2017-10-06 LAB — MRSA PCR SCREENING: MRSA by PCR: POSITIVE — AB

## 2017-10-06 MED ORDER — SODIUM CHLORIDE 0.9 % IV BOLUS
1000.0000 mL | Freq: Once | INTRAVENOUS | Status: AC
  Administered 2017-10-06: 1000 mL via INTRAVENOUS

## 2017-10-06 MED ORDER — MUPIROCIN 2 % EX OINT
1.0000 "application " | TOPICAL_OINTMENT | Freq: Two times a day (BID) | CUTANEOUS | Status: DC
  Administered 2017-10-06 – 2017-10-08 (×4): 1 via NASAL
  Filled 2017-10-06 (×3): qty 22

## 2017-10-06 MED ORDER — CHLORHEXIDINE GLUCONATE CLOTH 2 % EX PADS
6.0000 | MEDICATED_PAD | Freq: Every day | CUTANEOUS | Status: DC
  Administered 2017-10-07 – 2017-10-08 (×2): 6 via TOPICAL

## 2017-10-06 MED ORDER — ENOXAPARIN SODIUM 40 MG/0.4ML ~~LOC~~ SOLN
40.0000 mg | SUBCUTANEOUS | Status: DC
  Administered 2017-10-06 – 2017-10-07 (×2): 40 mg via SUBCUTANEOUS
  Filled 2017-10-06 (×2): qty 0.4

## 2017-10-06 MED ORDER — PROCHLORPERAZINE EDISYLATE 10 MG/2ML IJ SOLN
10.0000 mg | Freq: Once | INTRAMUSCULAR | Status: AC
  Administered 2017-10-06: 10 mg via INTRAVENOUS
  Filled 2017-10-06: qty 2

## 2017-10-06 MED ORDER — SODIUM CHLORIDE 0.9 % IV SOLN
INTRAVENOUS | Status: DC
  Administered 2017-10-06: 5.1 [IU]/h via INTRAVENOUS
  Administered 2017-10-07: 1.4 [IU]/h via INTRAVENOUS
  Filled 2017-10-06 (×2): qty 1

## 2017-10-06 MED ORDER — LEVOTHYROXINE SODIUM 25 MCG PO TABS
175.0000 ug | ORAL_TABLET | Freq: Every day | ORAL | Status: DC
  Administered 2017-10-06 – 2017-10-08 (×3): 175 ug via ORAL
  Filled 2017-10-06 (×3): qty 1

## 2017-10-06 MED ORDER — ONDANSETRON HCL 4 MG/2ML IJ SOLN
4.0000 mg | Freq: Once | INTRAMUSCULAR | Status: AC
  Administered 2017-10-06: 4 mg via INTRAVENOUS
  Filled 2017-10-06: qty 2

## 2017-10-06 MED ORDER — ABACAVIR-DOLUTEGRAVIR-LAMIVUD 600-50-300 MG PO TABS
1.0000 | ORAL_TABLET | Freq: Every day | ORAL | Status: DC
  Administered 2017-10-06 – 2017-10-08 (×3): 1 via ORAL
  Filled 2017-10-06 (×4): qty 1

## 2017-10-06 MED ORDER — SODIUM CHLORIDE 0.9 % IV SOLN
INTRAVENOUS | Status: DC
  Administered 2017-10-06: 4.9 [IU]/h via INTRAVENOUS
  Filled 2017-10-06: qty 1

## 2017-10-06 MED ORDER — DEXTROSE-NACL 5-0.45 % IV SOLN
INTRAVENOUS | Status: DC
  Administered 2017-10-06 – 2017-10-07 (×3): via INTRAVENOUS

## 2017-10-06 MED ORDER — FENTANYL CITRATE (PF) 100 MCG/2ML IJ SOLN
100.0000 ug | INTRAMUSCULAR | Status: AC | PRN
  Administered 2017-10-06 (×2): 100 ug via INTRAVENOUS
  Filled 2017-10-06 (×2): qty 2

## 2017-10-06 MED ORDER — BUPROPION HCL ER (XL) 300 MG PO TB24
300.0000 mg | ORAL_TABLET | Freq: Every day | ORAL | Status: DC
  Administered 2017-10-06 – 2017-10-08 (×3): 300 mg via ORAL
  Filled 2017-10-06 (×3): qty 1

## 2017-10-06 MED ORDER — INSULIN GLARGINE 100 UNIT/ML ~~LOC~~ SOLN
15.0000 [IU] | Freq: Once | SUBCUTANEOUS | Status: AC
  Administered 2017-10-06: 15 [IU] via SUBCUTANEOUS
  Filled 2017-10-06: qty 0.15

## 2017-10-06 MED ORDER — TRAZODONE HCL 50 MG PO TABS: 25.0000 mg | ORAL_TABLET | Freq: Every evening | ORAL | Status: DC | PRN

## 2017-10-06 MED ORDER — SODIUM CHLORIDE 0.9 % IV SOLN: INTRAVENOUS | Status: DC

## 2017-10-06 NOTE — ED Provider Notes (Signed)
MSE was initiated and I personally evaluated the patient and placed orders (if any) at  6:29 AM on October 06, 2017.  The patient appears stable so that the remainder of the MSE may be completed by another provider.  Patient states he was diagnosed of diabetes at age 27.  He has managed by Dr. Everardo AllEllison, endocrinologist.  He states his blood sugars have been high and last week they changed his insulin from a long-acting insulin to a short acting insulin to try to control his blood sugars better.  He states yesterday September 14 he had some loss of appetite, and started getting nausea.  He has vomited 4 times including shortly after arrival in the ED.  He denies diarrhea.  He has been short of breath for about 3 hours and has some mild abdominal discomfort on the right.  He denies chest pain or polydipsia.  He is not sure if he is ever had DKA before but he states he has been admitted before.  Patient is awake and alert.  Pupils are equal reactive to light, oropharynx reveals very dry tongue.  He was advised to get have ice chips.  Lungs are clear, abdomen soft and nontender.  I do feel like I smelly with of fruity smell, and I cannot smell well.    I suspect patient is in DKA.  He was given IV fluids, IV nausea medication and started on insulin drip.  He was turned over at the change of shift.  Devoria AlbeIva Damary Doland, MD, [facility]    Tashaun Obey, MD 10/06/17 [phone]

## 2017-10-06 NOTE — ED Triage Notes (Signed)
Pt reports new change in insulin from long acting to short acting and has been having nausea and vomiting. Pt reports being a type 1 diabetic.

## 2017-10-06 NOTE — H&P (Addendum)
HPI  David Leon ZOX:[id number] DOB: 02-27-1990 DOA: 10/06/2017  PCP: Emi Belfast, FNP   Chief Complaint: Polydipsia  HPI:  27 year old male known history of diabetes mellitus type 2 followed by Dr. Everardo All of endocrinology-recent adjustment of meds by endocrinologist--IDDM, hypothyroidism Last admission to hospital 09/11/2014 at [facility] for DKA Tells me he was recently changed off of Lantus to NPH insulin-he was taking Lantus 50 units up until 1 week ago-had seen Dr. Everardo All and had been told that may be he should start NPH insulin 35 units[-it is written in Dr. George Hugh note however that this is supposed to be 55 units each morning apparently ]has a history of hypoglycemia into the 70s when he tried basal bolus regimen as an outpatient previously per his report-he has not been on an insulin pump in the past He is not been L He started to have yesterday evening 9/14 6-8 episodes of nonbloody emesis no chest pain no rash  He checked his sugar and it was around 470 He started feeling weaker and could not keep down any fluids  In addition endorses polyuria polydipsia + abdominal pain epigastric  He gets screened for retinopathy and nephropathy as well as it neuropathy at the outpatient and was last screened for retinopathy in December 2018  He is being admitted to the stepdown for DKA  Changed glargine to nph 1 wk ago--not appetite--eatign poorly--  ED Course: Given 3 units of saline, started on DKA protocol with insulin drip, I gave him 15 units of Lantus to jumpstart his transition in terms of his acidosis  Review of Systems:   As above  Past Medical History:  Diagnosis Date  . Diabetes mellitus without complication (HCC)   . Diabetic ketoacidosis without coma associated with type 1 diabetes mellitus (HCC)   . Hyperlipidemia 09/11/2014  . Thrombocytopenia (HCC) 09/10/2014  . Thyroid disease     History reviewed. No pertinent surgical history.   reports that  he has quit smoking. He has never used smokeless tobacco. He reports that he does not drink alcohol or use drugs. Mobility: Patient is independent and works as a Civil Service fast streamer for [employer] He has his bachelor degrees/MBA He is not married He smokes when he drinks maybe 1 time per week  Allergies  Allergen Reactions  . Penicillins Other (See Comments)    Has not had a reaction, just family history of same Has patient had a PCN reaction causing immediate rash, facial/tongue/throat swelling, SOB or lightheadedness with hypotension: No Has patient had a PCN reaction causing severe rash involving mucus membranes or skin necrosis: No Has patient had a PCN reaction that required hospitalization: No Has patient had a PCN reaction occurring within the last 10 years: No If all of the above answers are "NO", then may proceed with Cephalosporin use.     Family History  Problem Relation Age of Onset  . Diabetes Father   Mother has interstitial cystitis and hypothyroidism Father has diabetes mellitus and hypothyroidism in addition    Prior to Admission medications   Medication Sig Start Date End Date Taking? Authorizing Provider  abacavir-dolutegravir-lamiVUDine (TRIUMEQ) 600-50-300 MG tablet Take 1 tablet by mouth daily.   Yes [provider]  buPROPion (WELLBUTRIN XL) 300 MG 24 hr tablet Take 1 tablet (300 mg total) by mouth daily. 01/11/17  Yes Emi Belfast, FNP  glucagon 1 MG injection Inject 1 mg into the muscle once as needed. Patient taking differently: Inject 1 mg into the  muscle once as needed (for low blood sugar).  09/23/14  Yes Romero Belling, MD  Insulin NPH, Human,, Isophane, (HUMULIN N KWIKPEN) 100 UNIT/ML Kiwkpen Inject 55 Units into the skin every morning. And pen needles 1/day Patient taking differently: Inject 35 Units into the skin every morning. And pen needles 1/day 08/19/17  Yes Romero Belling, MD  levothyroxine (SYNTHROID, LEVOTHROID) 175 MCG  tablet Take 1 tablet (175 mcg total) by mouth daily before breakfast. 08/13/17  Yes Romero Belling, MD  atorvastatin (LIPITOR) 10 MG tablet Take 1 tablet (10 mg total) by mouth daily. Patient not taking: Reported on 10/06/2017 09/11/14   Elliot Cousin, MD  Continuous Blood Gluc Receiver (FREESTYLE LIBRE 14 DAY READER) DEVI 1 Units by Does not apply route continuous. Patient not taking: Reported on 10/06/2017 01/16/17   Emi Belfast, FNP  Continuous Blood Gluc Sensor (FREESTYLE LIBRE 14 DAY SENSOR) MISC 1 Units by Does not apply route every 14 (fourteen) days. Patient not taking: Reported on 10/06/2017 01/16/17   Emi Belfast, FNP  glucose blood (ONETOUCH VERIO) test strip 1 each by Other route 4 (four) times daily. And lancets 4/day Patient not taking: Reported on 10/06/2017 03/13/16   Romero Belling, MD  traZODone (DESYREL) 50 MG tablet Take 0.5-1 tablets (25-50 mg total) by mouth at bedtime as needed for sleep. Patient not taking: Reported on 10/06/2017 02/29/16   Emi Belfast, FNP    Physical Exam:  Vitals:   10/06/17 1015 10/06/17 1030  BP:  (!) 155/89  Pulse: (!) 132 (!) 133  Resp: (!) 22 (!) 21  Temp:    SpO2: 99% 98%     Alert oriented slightly jittery and uncomfortable appearing no icterus no pallor  Throat is very dry Mallampati 0 no JVD  Chest is clear without added sound  S1-S2 tachycardic  Abdomen is soft without rebound  Range of motion intact  Sensory is intact to cold touch lower extremities for endoscopy deferred at patient's request  I have personally reviewed following labs and imaging studies  Labs:   VBG 7.09/29  Potassium 5.1 BUN/creatinine up from 12.08-->20/1.3  AG = 27  Lactic acid 4.11-->2.4  Imaging studies:   CXR 2 view [-]  Medical tests:   EKG independently reviewed: sinus tach-Mild ST-t elev     Test discussed with performing physician:  y   Decision to obtain old records:   y   Review and summation of old  records:   y   Active Problems:   * No active hospital problems. *   Assessment/Plan mod-severe DKA with lactic acidosis and hyperkalemia secondary to the same History of HIV since 04/19/2017 on Triumeq Tachycardic Hypothyroidism Mild obesity Hyperkalemia  Patient will be admitted on the DKA protocol-although his potassium is mildly elevated I do not think he has significant T wave elevations-I have given him 15 units of Lantus to jumpstart his treatment for his acidosis-he received 1 dose of Dilaudid in addition to 1 dose of antiemetics in the ED and has stopped vomiting and is able to tolerate clear liquids I think he can continue on the same Last A1c was 07/12/2017 at 10.5 which indicates poor control-he is reportedly noncompliant with diabetic diet  -Nursing is to contact MD for DKA II when insulin below 250 + will switch to D5  Hyperkalemia-as above monitor and should come down  Sinus tachycardia from severe volume depletion received 3 boluses of fluids-as above--continue IV saline at high rate as per [facility]  HIV-outpatient  follow-up needed-would get viral loads as per protocol,?  MSM--has been screened recently for chlamydia and gonorrhea RPR and other STDs as an outpatient 03/2017  Occasional smoker-outpatient counseling  Mild obesity outpatient counseling      Severity of Illness: The appropriate patient status for this patient is INPATIENT. Inpatient status is judged to be reasonable and necessary in order to provide the required intensity of service to ensure the patient's safety. The patient's presenting symptoms, physical exam findings, and initial radiographic and laboratory data in the context of their chronic comorbidities is felt to place them at high risk for further clinical deterioration. Furthermore, it is not anticipated that the patient will be medically stable for discharge from the hospital within 2 midnights of admission. The following factors support the  patient status of inpatient.   " The patient's presenting symptoms include dka. " The worrisome physical exam findings include dka. " The initial radiographic and laboratory data are worrisome because of dka. " The chronic co-morbidities include msm.   * I certify that at the point of admission it is my clinical judgment that the patient will require inpatient hospital care spanning beyond 2 midnights from the point of admission due to high intensity of service, high risk for further deterioration and high frequency of surveillance required.*     DVT prophylaxis: Lovenox Code Status: Full Family Communication: Mother Consults called: Inpatient  Time spent: 21 minutes  Shiva Sahagian, MD  [facility] Direct contact: [phone] --Via amion app OR  --[email or web address]; password [email or web address]  7PM-7AM contact night coverage as above  10/06/2017, 11:15 AM

## 2017-10-06 NOTE — ED Notes (Signed)
ED provider Effie ShyWentz notified patient has a critical lactic acid value of 2.42

## 2017-10-06 NOTE — ED Notes (Signed)
ED provider contacted during physician shift change, patient has critical lactic acid value of 4.11. RN on duty made aware and will pass along too oncoming ED provider

## 2017-10-06 NOTE — ED Provider Notes (Signed)
[facility]-EMERGENCY DEPT Provider Note   CSN: [id number] Arrival date & time: 10/06/17  0531     History   Chief Complaint No chief complaint on file.   HPI David Leon is a 27 y.o. male.  HPI   He presents for evaluation of known high blood sugar.  He also complains of dry mouth, general weakness, cough productive of clear sputum.  He states his doctor changed him to a new type of insulin about a week ago.  He is trying to follow-up carbohydrate modified diet but states he has trouble because he "travels a lot."  No other recent illnesses.  There are no other known modifying factors.  Record review indicates that he was changed from insulin glargine, to NPH, on 08/19/2017.  Past Medical History:  Diagnosis Date  . Diabetes mellitus without complication (HCC)   . Diabetic ketoacidosis without coma associated with type 1 diabetes mellitus (HCC)   . Hyperlipidemia 09/11/2014  . Thrombocytopenia (HCC) 09/10/2014  . Thyroid disease     Patient Active Problem List   Diagnosis Date Noted  . DKA, type 1 (HCC) 10/06/2017  . Type 1 diabetes mellitus without complications (HCC) 04/08/2017  . Wellness examination 02/24/2016  . Diabetes (HCC) 09/23/2014  . Hyperlipidemia 09/11/2014  . Fluid volume depletion 09/10/2014  . Thrombocytopenia (HCC) 09/10/2014  . Hypothyroidism 09/10/2014  . Hyponatremia 09/10/2014  . Diabetic ketoacidosis without coma associated with type 1 diabetes mellitus (HCC)     History reviewed. No pertinent surgical history.      Home Medications    Prior to Admission medications   Medication Sig Start Date End Date Taking? Authorizing Provider  abacavir-dolutegravir-lamiVUDine (TRIUMEQ) 600-50-300 MG tablet Take 1 tablet by mouth daily.   Yes [provider]  buPROPion (WELLBUTRIN XL) 300 MG 24 hr tablet Take 1 tablet (300 mg total) by mouth daily. 01/11/17  Yes Emi Belfast, FNP  glucagon 1 MG injection Inject  1 mg into the muscle once as needed. Patient taking differently: Inject 1 mg into the muscle once as needed (for low blood sugar).  09/23/14  Yes Romero Belling, MD  Insulin NPH, Human,, Isophane, (HUMULIN N KWIKPEN) 100 UNIT/ML Kiwkpen Inject 55 Units into the skin every morning. And pen needles 1/day Patient taking differently: Inject 35 Units into the skin every morning. And pen needles 1/day 08/19/17  Yes Romero Belling, MD  levothyroxine (SYNTHROID, LEVOTHROID) 175 MCG tablet Take 1 tablet (175 mcg total) by mouth daily before breakfast. 08/13/17  Yes Romero Belling, MD  atorvastatin (LIPITOR) 10 MG tablet Take 1 tablet (10 mg total) by mouth daily. Patient not taking: Reported on 10/06/2017 09/11/14   Elliot Cousin, MD  Continuous Blood Gluc Receiver (FREESTYLE LIBRE 14 DAY READER) DEVI 1 Units by Does not apply route continuous. Patient not taking: Reported on 10/06/2017 01/16/17   Emi Belfast, FNP  Continuous Blood Gluc Sensor (FREESTYLE LIBRE 14 DAY SENSOR) MISC 1 Units by Does not apply route every 14 (fourteen) days. Patient not taking: Reported on 10/06/2017 01/16/17   Emi Belfast, FNP  glucose blood (ONETOUCH VERIO) test strip 1 each by Other route 4 (four) times daily. And lancets 4/day Patient not taking: Reported on 10/06/2017 03/13/16   Romero Belling, MD  traZODone (DESYREL) 50 MG tablet Take 0.5-1 tablets (25-50 mg total) by mouth at bedtime as needed for sleep. Patient not taking: Reported on 10/06/2017 02/29/16   Emi Belfast, FNP    Family History Family  History  Problem Relation Age of Onset  . Diabetes Father     Social History Social History   Tobacco Use  . Smoking status: Former Games developer  . Smokeless tobacco: Never Used  Substance Use Topics  . Alcohol use: No  . Drug use: No     Allergies   Penicillins   Review of Systems Review of Systems   Physical Exam Updated Vital Signs BP (!) 155/89   Pulse (!) 133   Temp 98.4 F (36.9 C) (Oral)    Resp (!) 21   Ht 5\' 11"  (1.803 m)   Wt 96.2 kg   SpO2 98%   BMI 29.57 kg/m   Physical Exam  Constitutional: He is oriented to person, place, and time. He appears well-developed and well-nourished. He appears distressed (He is uncomfortable).  HENT:  Head: Normocephalic and atraumatic.  Right Ear: External ear normal.  Left Ear: External ear normal.  Dry oral mucous membranes  Eyes: Pupils are equal, round, and reactive to light. Conjunctivae and EOM are normal.  Neck: Normal range of motion and phonation normal. Neck supple.  Cardiovascular: Normal rate, regular rhythm and normal heart sounds.  Pulmonary/Chest: Effort normal and breath sounds normal. No respiratory distress. He has no wheezes. He exhibits no bony tenderness.  Abdominal: Soft. There is no tenderness.  Musculoskeletal: Normal range of motion.  Neurological: He is alert and oriented to person, place, and time. No cranial nerve deficit or sensory deficit. He exhibits normal muscle tone. Coordination normal.  Skin: Skin is warm, dry and intact.  Psychiatric: He has a normal mood and affect. His behavior is normal. Judgment and thought content normal.  Nursing note and vitals reviewed.    ED Treatments / Results  Labs (all labs ordered are listed, but only abnormal results are displayed) Labs Reviewed  CBC WITH DIFFERENTIAL/PLATELET - Abnormal; Notable for the following components:      Result Value   WBC 18.6 (*)    Hemoglobin 17.2 (*)    MCHC 36.8 (*)    Neutro Abs 16.9 (*)    All other components within normal limits  URINALYSIS, ROUTINE W REFLEX MICROSCOPIC - Abnormal; Notable for the following components:   Glucose, UA >=500 (*)    Hgb urine dipstick SMALL (*)    Ketones, ur 80 (*)    Protein, ur 30 (*)    All other components within normal limits  BLOOD GAS, VENOUS - Abnormal; Notable for the following components:   pH, Ven 7.095 (*)    pCO2, Ven 29.9 (*)    pO2, Ven 31.4 (*)    Bicarbonate 8.8 (*)     Acid-base deficit 21.0 (*)    All other components within normal limits  COMPREHENSIVE METABOLIC PANEL - Abnormal; Notable for the following components:   CO2 7 (*)    Glucose, Bld 403 (*)    Creatinine, Ser 1.35 (*)    Total Bilirubin 2.1 (*)    Anion gap 27 (*)    All other components within normal limits  CBG MONITORING, ED - Abnormal; Notable for the following components:   Glucose-Capillary 512 (*)    All other components within normal limits  I-STAT CG4 LACTIC ACID, ED - Abnormal; Notable for the following components:   Lactic Acid, Venous 4.11 (*)    All other components within normal limits  CBG MONITORING, ED - Abnormal; Notable for the following components:   Glucose-Capillary 553 (*)    All other components within normal limits  I-STAT CG4 LACTIC ACID, ED - Abnormal; Notable for the following components:   Lactic Acid, Venous 2.42 (*)    All other components within normal limits  CBG MONITORING, ED - Abnormal; Notable for the following components:   Glucose-Capillary 428 (*)    All other components within normal limits  CBG MONITORING, ED - Abnormal; Notable for the following components:   Glucose-Capillary 320 (*)    All other components within normal limits  CULTURE, BLOOD (ROUTINE X 2)  CULTURE, BLOOD (ROUTINE X 2)  PROTIME-INR  CBG MONITORING, ED    EKG EKG Interpretation  Date/Time:  Sunday October 06 2017 06:23:38 EDT Ventricular Rate:  148 PR Interval:    QRS Duration: 96 QT Interval:  283 QTC Calculation: 444 R Axis:   105 Text Interpretation:  Sinus tachycardia Borderline right axis deviation Baseline wander in lead(s) V5 No old tracing to compare Confirmed by Devoria AlbeKnapp, Iva [phone]([id number]) on 10/06/2017 6:37:58 AM   Radiology Dg Chest 2 View  Result Date: 10/06/2017 CLINICAL DATA:  Suspected sepsis.  Nausea and vomiting. EXAM: CHEST - 2 VIEW COMPARISON:  None. FINDINGS: The cardiomediastinal contours are normal. The lungs are clear. Pulmonary vasculature is  normal. No consolidation, pleural effusion, or pneumothorax. No acute osseous abnormalities are seen. IMPRESSION: Negative radiographs of the chest. Electronically Signed   By: Narda RutherfordMelanie  Sanford M.D.   On: 10/06/2017 06:45    Procedures .Critical Care Performed by: Mancel BaleWentz, Sekou Zuckerman, MD Authorized by: Mancel BaleWentz, Madelin Weseman, MD   Critical care provider statement:    Critical care time (minutes):  45   Critical care start time:  10/06/2017 7:05 AM   Critical care time was exclusive of:  Separately billable procedures and treating other patients   Critical care was necessary to treat or prevent imminent or life-threatening deterioration of the following conditions:  Metabolic crisis   Critical care was time spent personally by me on the following activities:  Blood draw for specimens, development of treatment plan with patient or surrogate, discussions with consultants, evaluation of patient's response to treatment, examination of patient, obtaining history from patient or surrogate, ordering and performing treatments and interventions, ordering and review of laboratory studies, pulse oximetry, re-evaluation of patient's condition, review of old charts and ordering and review of radiographic studies   (including critical care time)  Medications Ordered in ED Medications  insulin regular (NOVOLIN R,HUMULIN R) 100 Units in sodium chloride 0.9 % 100 mL (1 Units/mL) infusion (2.6 Units/hr Intravenous Rate/Dose Change 10/06/17 1034)  fentaNYL (SUBLIMAZE) injection 100 mcg (100 mcg Intravenous Given 10/06/17 0902)  sodium chloride 0.9 % bolus 1,000 mL (1,000 mLs Intravenous New Bag/Given 10/06/17 1114)  insulin glargine (LANTUS) injection 15 Units (has no administration in time range)  sodium chloride 0.9 % bolus 1,000 mL (0 mLs Intravenous Stopped 10/06/17 0831)  sodium chloride 0.9 % bolus 1,000 mL (0 mLs Intravenous Stopped 10/06/17 0831)  ondansetron (ZOFRAN) injection 4 mg (4 mg Intravenous Given 10/06/17 0743)    prochlorperazine (COMPAZINE) injection 10 mg (10 mg Intravenous Given 10/06/17 0902)     Initial Impression / Assessment and Plan / ED Course  I have reviewed the triage vital signs and the nursing notes.  Pertinent labs & imaging results that were available during my care of the patient were reviewed by me and considered in my medical decision making (see chart for details).  Clinical Course as of Oct 06 1120  Sun Oct 06, 2017  1056 Normal except elevated glucose, ketones, protein and hemoglobin  Urinalysis, Routine w reflex microscopic(!) [EW]  1057 High, but improved.  POC CBG, ED(!) [EW]  1057 Elevated but improved.  I-Stat CG4 Lactic Acid, ED(!!) [EW]  1057 Normal except CO2 low, glucose high, creatinine high  Comprehensive metabolic panel(!) [EW]  1057 Abnormal, consistent with metabolic acidosis  Blood gas, venous(!!) [EW]  1057 Normal except white count elevated  CBC with Differential(!) [EW]  1057 No infiltrate, images reviewed by me  DG Chest 2 View [EW]  1059 Persistent tachycardia we will give additional IV fluids.   [EW]    Clinical Course User Index [EW] Mancel Bale, MD     Patient Vitals for the past 24 hrs:  BP Temp Temp src Pulse Resp SpO2 Height Weight  10/06/17 1030 (!) 155/89 - - (!) 133 (!) 21 98 % - -  10/06/17 1015 - - - (!) 132 (!) 22 99 % - -  10/06/17 1000 (!) 149/90 - - (!) 136 (!) 21 97 % - -  10/06/17 0930 (!) 153/103 - - (!) 137 (!) 23 99 % - -  10/06/17 0900 (!) 152/89 - - - (!) 27 - - -  10/06/17 0830 (!) 156/92 - - (!) 125 (!) 31 100 % - -  10/06/17 0800 134/84 - - (!) 141 (!) 27 100 % - -  10/06/17 0625 123/84 98.4 F (36.9 C) Oral (!) 145 (!) 23 100 % 5\' 11"  (1.803 m) 96.2 kg    10:58 AM Reevaluation with update and discussion. After initial assessment and treatment, an updated evaluation reveals he remains alert and conversant.  He states his pain and vomiting are better now and he would like to try some water to drink.  Findings  discussed with patient and family member, all questions were answered. Mancel Bale   Medical Decision Making: DKA following recent change from insulin glargine to NPH.  No clear cause for DKA.  Significant acidosis and dehydration accounting for abnormal vital signs.  She will require admission for stabilization with ongoing management of metabolic crisis.  Doubt serious bacterial infection, or impending vascular collapse.  CRITICAL CARE-yes Performed by: Mancel Bale   Nursing Notes Reviewed/ Care Coordinated Applicable Imaging Reviewed Interpretation of Laboratory Data incorporated into ED treatment   11:08 AM-Consult complete with hospitalist. Patient case explained and discussed.  He agrees to admit patient for further evaluation and treatment. Call ended at 11:21 AM   Plan: Admit    Final Clinical Impressions(s) / ED Diagnoses   Final diagnoses:  Diabetic ketoacidosis without coma associated with type 1 diabetes mellitus [facility])  Dehydration    ED Discharge Orders    None       Mancel Bale, MD 10/06/17 1123

## 2017-10-07 DIAGNOSIS — E860 Dehydration: Secondary | ICD-10-CM

## 2017-10-07 DIAGNOSIS — E1010 Type 1 diabetes mellitus with ketoacidosis without coma: Principal | ICD-10-CM

## 2017-10-07 LAB — GLUCOSE, CAPILLARY
GLUCOSE-CAPILLARY: 142 mg/dL — AB (ref 70–99)
GLUCOSE-CAPILLARY: 145 mg/dL — AB (ref 70–99)
GLUCOSE-CAPILLARY: 169 mg/dL — AB (ref 70–99)
GLUCOSE-CAPILLARY: 221 mg/dL — AB (ref 70–99)
GLUCOSE-CAPILLARY: 171 mg/dL — AB (ref 70–99)
Glucose-Capillary: 135 mg/dL — ABNORMAL HIGH (ref 70–99)
Glucose-Capillary: 167 mg/dL — ABNORMAL HIGH (ref 70–99)
Glucose-Capillary: 155 mg/dL — ABNORMAL HIGH (ref 70–99)
Glucose-Capillary: 163 mg/dL — ABNORMAL HIGH (ref 70–99)
Glucose-Capillary: 186 mg/dL — ABNORMAL HIGH (ref 70–99)
Glucose-Capillary: 230 mg/dL — ABNORMAL HIGH (ref 70–99)
Glucose-Capillary: 152 mg/dL — ABNORMAL HIGH (ref 70–99)
Glucose-Capillary: 269 mg/dL — ABNORMAL HIGH (ref 70–99)
Glucose-Capillary: 234 mg/dL — ABNORMAL HIGH (ref 70–99)

## 2017-10-07 LAB — BASIC METABOLIC PANEL
ANION GAP: 9 (ref 5–15)
Anion gap: 10 (ref 5–15)
Anion gap: 8 (ref 5–15)
BUN: 11 mg/dL (ref 6–20)
BUN: 10 mg/dL (ref 6–20)
BUN: 9 mg/dL (ref 6–20)
CALCIUM: 8.3 mg/dL — AB (ref 8.9–10.3)
CHLORIDE: 107 mmol/L (ref 98–111)
CO2: 18 mmol/L — ABNORMAL LOW (ref 22–32)
CO2: 21 mmol/L — ABNORMAL LOW (ref 22–32)
CO2: 22 mmol/L (ref 22–32)
CREATININE: 0.74 mg/dL (ref 0.61–1.24)
CREATININE: 0.76 mg/dL (ref 0.61–1.24)
Calcium: 8.5 mg/dL — ABNORMAL LOW (ref 8.9–10.3)
Calcium: 8.9 mg/dL (ref 8.9–10.3)
Chloride: 106 mmol/L (ref 98–111)
Chloride: 104 mmol/L (ref 98–111)
Creatinine, Ser: 0.66 mg/dL (ref 0.61–1.24)
GFR calc Af Amer: >60 mL/min (ref >60)
GFR calc Af Amer: >60 mL/min (ref >60)
GFR calc non Af Amer: >60 mL/min (ref >60)
GLUCOSE: 171 mg/dL — AB (ref 70–99)
GLUCOSE: 181 mg/dL — AB (ref 70–99)
Glucose, Bld: 140 mg/dL — ABNORMAL HIGH (ref 70–99)
POTASSIUM: 3.3 mmol/L — AB (ref 3.5–5.1)
POTASSIUM: 3.8 mmol/L (ref 3.5–5.1)
POTASSIUM: 3.5 mmol/L (ref 3.5–5.1)
SODIUM: 134 mmol/L — AB (ref 135–145)
SODIUM: 137 mmol/L (ref 135–145)
Sodium: 134 mmol/L — ABNORMAL LOW (ref 135–145)

## 2017-10-07 MED ORDER — INSULIN GLARGINE 100 UNIT/ML ~~LOC~~ SOLN
20.0000 [IU] | Freq: Every day | SUBCUTANEOUS | Status: DC
  Filled 2017-10-07: qty 0.2

## 2017-10-07 MED ORDER — MAGNESIUM SULFATE 2 GM/50ML IV SOLN
2.0000 g | Freq: Once | INTRAVENOUS | Status: AC
  Administered 2017-10-07: 2 g via INTRAVENOUS
  Filled 2017-10-07: qty 50

## 2017-10-07 MED ORDER — INSULIN ASPART 100 UNIT/ML ~~LOC~~ SOLN
0.0000 [IU] | Freq: Three times a day (TID) | SUBCUTANEOUS | Status: DC
  Administered 2017-10-07 – 2017-10-08 (×3): 11 [IU] via SUBCUTANEOUS

## 2017-10-07 MED ORDER — ACETAMINOPHEN 325 MG PO TABS
650.0000 mg | ORAL_TABLET | Freq: Four times a day (QID) | ORAL | Status: DC | PRN
  Administered 2017-10-07: 650 mg via ORAL
  Filled 2017-10-07: qty 2

## 2017-10-07 MED ORDER — INSULIN ASPART 100 UNIT/ML ~~LOC~~ SOLN
0.0000 [IU] | Freq: Every day | SUBCUTANEOUS | Status: DC
  Administered 2017-10-07: 2 [IU] via SUBCUTANEOUS

## 2017-10-07 MED ORDER — POTASSIUM CHLORIDE 10 MEQ/100ML IV SOLN
10.0000 meq | INTRAVENOUS | Status: AC
  Administered 2017-10-07 (×2): 10 meq via INTRAVENOUS
  Filled 2017-10-07 (×2): qty 100

## 2017-10-07 MED ORDER — INSULIN GLARGINE 100 UNIT/ML ~~LOC~~ SOLN
15.0000 [IU] | Freq: Once | SUBCUTANEOUS | Status: AC
  Administered 2017-10-07: 15 [IU] via SUBCUTANEOUS
  Filled 2017-10-07: qty 0.15

## 2017-10-07 NOTE — Progress Notes (Signed)
Patient transferred from stepdown. Alert and oriented x 4. BP and HR little elevated. Will recheck again in an hour. Patient in no acute distress. RN will continue to monitor.

## 2017-10-07 NOTE — Care Management Note (Signed)
Case Management Note  Patient Details  Name: David Leon MRN: [id number] Date of Birth: 12/27/1990  Subjective/Objective:                  dka/  Action/Plan: Iv insulin drip,D51/2ns at 125cc/hrs,iv ns at 100cc/hrs  Expected Discharge Date:                  Expected Discharge Plan:  Home/Self Care  In-House Referral:     Discharge planning Services  CM Consult  Post Acute Care Choice:    Choice offered to:     DME Arranged:    DME Agency:     HH Arranged:    HH Agency:     Status of Service:  In process, will continue to follow  If discussed at Long Length of Stay Meetings, dates discussed:    Additional Comments:  Golda AcreDavis, Lyndall Windt Lynn, RN 10/07/2017, 8:50 AM

## 2017-10-07 NOTE — Progress Notes (Deleted)
CRITICAL VALUE ALERT  Critical Value:  Lactic Acid  Date & Time Notfied:  10/07/2017 1315  Provider Notified: Ashley RoyaltyMatthews 10/07/2017   Orders Received/Actions taken: awaiting orders

## 2017-10-07 NOTE — Progress Notes (Signed)
Inpatient Diabetes Program Recommendations  AACE/ADA: New Consensus Statement on Inpatient Glycemic Control (2015)  Target Ranges:  Prepandial:   less than 140 mg/dL      Peak postprandial:   less than 180 mg/dL (1-2 hours)      Critically ill patients:  140 - 180 mg/dL   Lab Results  Component Value Date   GLUCAP 269 (H) 10/07/2017   HGBA1C 10.5 (A) 07/12/2017    Review of Glycemic Control  Diabetes history: DM1 (insulin since dx) Outpatient Diabetes medications: NPH 35 units QAM (MD ordered 55 units QAM) Current orders for Inpatient glycemic control: Lantus 20 units QHS, Novolog 0-20 units tidwc and hs  Inpatient Diabetes Program Recommendations:    Decrease Novolog to 0-9 units tidwc and hs (Type 1 and sensitive to insulin) Add Novolog 8 units tidwc for meal coverage insulin if pt eats > 50% meal. Lantus 40 units QHS (to start 9/17)  Spoke with pt and Mom regarding diabetes control at home. Pt states he was having lows in early am and saw endo on 08/19/2017. Previously on Basaglar 50 units QD and Novolog 15 units tidwc. Endo changed basal insulin to NPH 55 units QAM (pt mistakenly was taking 35 instead of 55 units.) Thus seen in ED with DKA. Pt interested in Hospitalist to refer to another endo at present. Very interested in CGM and insulin pump. Had great questions.   Will f/u in am.  Thank you. Ailene Ardshonda Siddiq Kaluzny, RD, LDN, CDE Inpatient Diabetes Coordinator [phone]

## 2017-10-07 NOTE — Progress Notes (Signed)
PROGRESS NOTE    David Leon  RUE:[id number] DOB: 02/27/1990 DOA: 10/06/2017 PCP: David Leon, David B, FNP  Brief Narrative: 27 year old male known history of diabetes mellitus type 2 followed by Dr. Everardo AllEllison of endocrinology-recent adjustment of meds by endocrinologist--IDDM, hypothyroidism Last admission to hospital 09/11/2014 at [facility] for DKA Tells me he was recently changed off of Lantus to NPH insulin-he was taking Lantus 50 units up until 1 week ago-had seen Dr. Everardo AllEllison and had been told that may be he should start NPH insulin 35 units[-it is written in Dr. George HughEllison's note however that this is supposed to be 55 units each morning apparently ]has a history of hypoglycemia into the 70s when he tried basal bolus regimen as an outpatient previously per his report-he has not been on an insulin pump in the past He is not been L He started to have yesterday evening 9/14 6-8 episodes of nonbloody emesis no chest pain no rash  He checked his sugar and it was around 470 He started feeling weaker and could not keep down any fluids  In addition endorses polyuria polydipsia + abdominal pain epigastric  He gets screened for retinopathy and nephropathy as well as it neuropathy at the outpatient and was last screened for retinopathy in December 2018  He is being admitted to the stepdown for DKA  Changed glargine to nph 1 wk ago--not appetite--eatign poorly--  ED Course: Given 3 units of saline, started on DKA protocol with insulin drip, I gave him 15 units of Lantus to jumpstart his transition in terms of his acidosis   Assessment & Plan:   Active Problems:   DKA, type 1 (HCC)  1]DKA -patient was admitted to the stepdown unit started on IV insulin drip.  Currently he is awake alert blood sugar stable gap closed.  Lantus 15 units nightly this morning drip will be DC'd today and diet is ordered.  He is afebrile no nausea vomiting diarrhea abdominal pain he is awake and alert.  He  reports insulin was changed recently and he was taking a lower dose and he was supposed to be taking.  Monitor blood sugar.  Transfer him to regular floor.    2] hypokalemia replete.  3] sinus tach resolved with IV fluids.  4] history of HIV outpatient follow-up   DVT prophylaxis: Lovenox Code Status: Full code  family Communication:DIS Cussed with mother  disposition Plan: Discharge in a.m. if remains stable Consultants:  None  Procedures: None Antimicrobials: None  Subjective: Feels well feeling hungry asking for food denies any nausea vomiting   Objective: Vitals:   10/07/17 1000 10/07/17 1100 10/07/17 1200 10/07/17 1300  BP: 135/87 132/81 131/64 132/86  Pulse:      Resp: (!) 22 18 16  (!) 22  Temp:   97.9 F (36.6 C)   TempSrc:   Oral   SpO2: 100% 99% 100% 100%  Weight:      Height:        Intake/Output Summary (Last 24 hours) at 10/07/2017 1410 Last data filed at 10/07/2017 0925 Gross per 24 hour  Intake 1590.52 ml  Output 700 ml  Net 890.52 ml   Filed Weights   10/06/17 0625 10/06/17 1200  Weight: 96.2 kg 88.9 kg    Examination:  General exam: Appears calm and comfortable  Respiratory system: Clear to auscultation. Respiratory effort normal. Cardiovascular system: S1 & S2 heard, RRR. No JVD, murmurs, rubs, gallops or clicks. No pedal edema. Gastrointestinal system: Abdomen is nondistended, soft and nontender.  No organomegaly or masses felt. Normal bowel sounds heard. Central nervous system: Alert and oriented. No focal neurological deficits. Extremities: Symmetric 5 x 5 power. Skin: No rashes, lesions or ulcers Psychiatry: Judgement and insight appear normal. Mood & affect appropriate.     Data Reviewed: I have personally reviewed following labs and imaging studies  CBC: Recent Labs  Lab 10/06/17 0709  WBC 18.6*  NEUTROABS 16.9*  HGB 17.2*  HCT 46.8  MCV 91.2  PLT 330   Basic Metabolic Panel: Recent Labs  Lab 10/06/17 1817 10/06/17 2239  10/07/17 0320 10/07/17 0759 10/07/17 1257  NA 136 139 134* 134* 137  K 4.4 4.2 3.3* 3.8 3.5  CL 106 109 106 104 107  CO2 17* 19* 18* 21* 22  GLUCOSE 156* 109* 140* 171* 181*  BUN 13 13 11 10 9   CREATININE 0.89 0.82 0.74 0.76 0.66  CALCIUM 9.0 9.0 8.3* 8.5* 8.9   GFR: Estimated Creatinine Clearance: 147.7 mL/min (by C-G formula based on SCr of 0.66 mg/dL). Liver Function Tests: Recent Labs  Lab 10/06/17 0855  AST 15  ALT 17  ALKPHOS 69  BILITOT 2.1*  PROT 8.1  ALBUMIN 4.9   No results for input(s): LIPASE, AMYLASE in the last 168 hours. No results for input(s): AMMONIA in the last 168 hours. Coagulation Profile: Recent Labs  Lab 10/06/17 0855  INR 1.09   Cardiac Enzymes: No results for input(s): CKTOTAL, CKMB, CKMBINDEX, TROPONINI in the last 168 hours. BNP (last 3 results) No results for input(s): PROBNP in the last 8760 hours. HbA1C: No results for input(s): HGBA1C in the last 72 hours. CBG: Recent Labs  Lab 10/07/17 0947 10/07/17 1103 10/07/17 1201 10/07/17 1255 10/07/17 1350  GLUCAP 186* 230* 221* 171* 152*   Lipid Profile: No results for input(s): CHOL, HDL, LDLCALC, TRIG, CHOLHDL, LDLDIRECT in the last 72 hours. Thyroid Function Tests: No results for input(s): TSH, T4TOTAL, FREET4, T3FREE, THYROIDAB in the last 72 hours. Anemia Panel: No results for input(s): VITAMINB12, FOLATE, FERRITIN, TIBC, IRON, RETICCTPCT in the last 72 hours. Sepsis Labs: Recent Labs  Lab 10/06/17 0718 10/06/17 0900  LATICACIDVEN 4.11* 2.42*    Recent Results (from the past 240 hour(s))  Culture, blood (Routine x 2)     Status: None (Preliminary result)   Collection Time: 10/06/17  7:09 AM  Result Value Ref Range Status   Specimen Description   Final    BLOOD LEFT ARM Performed at [facility], [address], [place], [place] [address]    Special Requests   Final    BOTTLES DRAWN AEROBIC AND ANAEROBIC Blood Culture adequate volume Performed at  [facility], [address] [place], [place] [address]    Culture   Final    NO GROWTH < 24 HOURS Performed at [facility], [address] [place], [place] [address]    Report Status PENDING  Incomplete  Culture, blood (Routine x 2)     Status: None (Preliminary result)   Collection Time: 10/06/17  7:20 AM  Result Value Ref Range Status   Specimen Description   Final    BLOOD RIGHT HAND Performed at [facility], [address], [place], [place] [address]    Special Requests   Final    BOTTLES DRAWN AEROBIC AND ANAEROBIC Blood Culture adequate volume Performed at [facility], [address], [place], [place] [address]    Culture   Final    NO GROWTH <  24 HOURS Performed at [facility], [address] [place], [place] [address]    Report Status PENDING  Incomplete  MRSA PCR Screening     Status: Abnormal   Collection Time: 10/06/17 12:44 PM  Result Value Ref Range Status   MRSA by PCR POSITIVE (A) NEGATIVE Final    Comment:        The GeneXpert MRSA Assay (FDA approved for NASAL specimens only), is one component of a comprehensive MRSA colonization surveillance program. It is not intended to diagnose MRSA infection nor to guide or monitor treatment for MRSA infections. RESULT CALLED TO, READ BACK BY AND VERIFIED WITH: Ma Hillock 10/06/17 1612 RHOLMES Performed at [facility], [address] [place], [place] [address]          Radiology Studies: Dg Chest 2 View  Result Date: 10/06/2017 CLINICAL DATA:  Suspected sepsis.  Nausea and vomiting. EXAM: CHEST - 2 VIEW COMPARISON:  None. FINDINGS: The cardiomediastinal contours are normal. The lungs are clear. Pulmonary vasculature is normal. No consolidation, pleural effusion, or pneumothorax. No acute osseous abnormalities are seen. IMPRESSION: Negative radiographs of the chest. Electronically Signed   By: Narda Rutherford M.D.    On: 10/06/2017 06:45        Scheduled Meds: . abacavir-dolutegravir-lamiVUDine  1 tablet Oral Daily  . buPROPion  300 mg Oral Daily  . Chlorhexidine Gluconate Cloth  6 each Topical Q0600  . enoxaparin (LOVENOX) injection  40 mg Subcutaneous Q24H  . levothyroxine  175 mcg Oral QAC breakfast  . mupirocin ointment  1 application Nasal BID   Continuous Infusions: . sodium chloride    . dextrose 5 % and 0.45% NaCl 125 mL/hr at 10/07/17 0557  . insulin (NOVOLIN-R) infusion 4.4 Units/hr (10/07/17 1351)     LOS: 1 day    Alwyn Ren, MD [facility]  If 7PM-7AM, please contact night-coverage [email or web address] Password [email or web address] 10/07/2017, 2:10 PM

## 2017-10-08 LAB — COMPREHENSIVE METABOLIC PANEL
ALBUMIN: 3.8 g/dL (ref 3.5–5.0)
ALT: 12 U/L (ref 0–44)
ANION GAP: 11 (ref 5–15)
AST: 11 U/L — AB (ref 15–41)
Alkaline Phosphatase: 49 U/L (ref 38–126)
BUN: 8 mg/dL (ref 6–20)
CHLORIDE: 102 mmol/L (ref 98–111)
CO2: 24 mmol/L (ref 22–32)
Calcium: 8.8 mg/dL — ABNORMAL LOW (ref 8.9–10.3)
Creatinine, Ser: 0.69 mg/dL (ref 0.61–1.24)
GFR calc Af Amer: >60 mL/min (ref >60)
GFR calc non Af Amer: >60 mL/min (ref >60)
GLUCOSE: 315 mg/dL — AB (ref 70–99)
POTASSIUM: 3.9 mmol/L (ref 3.5–5.1)
SODIUM: 137 mmol/L (ref 135–145)
Total Bilirubin: 1.1 mg/dL (ref 0.3–1.2)
Total Protein: 6.7 g/dL (ref 6.5–8.1)

## 2017-10-08 LAB — CBC
HEMATOCRIT: 36.9 % — AB (ref 39.0–52.0)
Hemoglobin: 13.3 g/dL (ref 13.0–17.0)
MCH: 32.4 pg (ref 26.0–34.0)
MCHC: 36 g/dL (ref 30.0–36.0)
MCV: 90 fL (ref 78.0–100.0)
Platelets: 189 10*3/uL (ref 150–400)
RBC: 4.1 MIL/uL — ABNORMAL LOW (ref 4.22–5.81)
RDW: 11.9 % (ref 11.5–15.5)
WBC: 3.9 10*3/uL — AB (ref 4.0–10.5)

## 2017-10-08 LAB — GLUCOSE, CAPILLARY
GLUCOSE-CAPILLARY: 289 mg/dL — AB (ref 70–99)
Glucose-Capillary: 275 mg/dL — ABNORMAL HIGH (ref 70–99)

## 2017-10-08 LAB — MAGNESIUM: Magnesium: 2.1 mg/dL (ref 1.7–2.4)

## 2017-10-08 MED ORDER — INSULIN PEN NEEDLE 31G X 5 MM MISC: 40.0000 [IU] | Freq: Every day | 2 refills | Status: DC

## 2017-10-08 MED ORDER — INSULIN ASPART 100 UNIT/ML ~~LOC~~ SOLN: SUBCUTANEOUS | 3 refills | Status: DC

## 2017-10-08 MED ORDER — INSULIN ASPART 100 UNIT/ML FLEXPEN: 10.0000 [IU] | PEN_INJECTOR | Freq: Three times a day (TID) | SUBCUTANEOUS | 11 refills | Status: DC

## 2017-10-08 MED ORDER — INSULIN GLARGINE 100 UNIT/ML ~~LOC~~ SOLN
10.0000 [IU] | Freq: Every morning | SUBCUTANEOUS | Status: DC
  Administered 2017-10-08: 10 [IU] via SUBCUTANEOUS
  Filled 2017-10-08: qty 0.1

## 2017-10-08 MED ORDER — INSULIN ASPART 100 UNIT/ML CARTRIDGE (PENFILL): 10.0000 [IU] | Freq: Three times a day (TID) | SUBCUTANEOUS | 11 refills | Status: DC

## 2017-10-08 MED ORDER — INSULIN GLARGINE 100 UNIT/ML SOLOSTAR PEN: PEN_INJECTOR | SUBCUTANEOUS | 11 refills | Status: DC

## 2017-10-08 MED ORDER — INSULIN GLARGINE 100 UNIT/ML ~~LOC~~ SOLN: SUBCUTANEOUS | 3 refills | Status: DC

## 2017-10-08 NOTE — Care Management Note (Signed)
Case Management Note  Patient Details  Name: David Leon MRN: [id number] Date of Birth: 10/26/1990  Subjective/Objective:                  discharged to home   Action/Plan: No cm needs or hhc orders present.  Expected Discharge Date:  10/08/17               Expected Discharge Plan:  Home/Self Care  In-House Referral:     Discharge planning Services  CM Consult  Post Acute Care Choice:    Choice offered to:     DME Arranged:    DME Agency:     HH Arranged:    HH Agency:     Status of Service:  Completed, signed off  If discussed at [employer] Length of Stay Meetings, dates discussed:    Additional Comments:  Golda AcreDavis, Rhonda Lynn, RN 10/08/2017, 12:06 PM

## 2017-10-08 NOTE — Discharge Summary (Signed)
Physician Discharge Summary  David Leon ZOX:[id number] DOB: [birth year] DOA: 10/06/2017  PCP: Emi Belfast, FNP  Admit date: 10/06/2017 Discharge date: 10/08/2017  Admitted From:home Disposition:home Recommendations for Outpatient Follow-up:  1. Follow up with PCP in 1-2 weeks 2. Please obtain BMP/CBC in one week  Home Health:none Equipment/Devices:none  Discharge Condition stable CODE STATUS full Diet recommendation: carb modified  Brief/Interim Summary:27 year old male known history of diabetes mellitus type 2 followed by Dr. Everardo All of endocrinology-recent adjustment of meds by endocrinologist--IDDM, hypothyroidism Last admission to hospital 09/11/2014 at [facility] for DKA Tells me he was recently changed off of Lantus to NPH insulin-he was taking Lantus 50 units up until 1 week ago-had seen Dr. Everardo All and had been told that may be he should start NPH insulin 35 units[-it is written in Dr. George Hugh note however that this is supposed to be 55 units each morning apparently]has a history of hypoglycemia into the 70s when he tried basal bolus regimen as an outpatient previously per his report-he has not been on an insulin pump in the past He is not been L He started to have yesterday evening 9/14 6-8 episodes of nonbloody emesis no chest pain no rash  He checked his sugar and it was around 470 He started feeling weaker and could not keep down any fluids  In addition endorses polyuria polydipsia + abdominal pain epigastric  He gets screened for retinopathy and nephropathy as well as it neuropathy at the outpatient and was last screened for retinopathy in December 2018  He is being admitted to the stepdown for DKA  Changed glargine to nph 1 wk ago--not appetite--eatign poorly--  ED Course:Given 3 units of saline, started on DKA protocol with insulin drip, I gave him 15 units of Lantus to jumpstart his transition in terms of his acidosis Discharge Diagnoses:   Active Problems:   DKA, type 1 (HCC)   Dehydration  1]DKA -patient was admitted to the stepdown unit started on IV insulin drip.  Currently he is awake alert blood sugar stable gap closed. WILL dc on  Lantus.  He is afebrile no nausea vomiting diarrhea abdominal pain he is awake and alert.  He reports insulin was changed recently and he was taking a lower dose and he was supposed to be taking.  We will discharge him on Lantus 40 units at night.  Patient will follow-up with endocrinologist and PCP.  He already has an appoint with his PCP 10/11/2017.  2] hypokalemia repleted.  3] sinus tach resolved with IV fluids.  4] history of HIV outpatient follow-up  Discharge Instructions  Discharge Instructions    Call MD for:  difficulty breathing, headache or visual disturbances   Complete by:  As directed    Call MD for:  persistant nausea and vomiting   Complete by:  As directed    Call MD for:  redness, tenderness, or signs of infection (pain, swelling, redness, odor or green/yellow discharge around incision site)   Complete by:  As directed    Call MD for:  severe uncontrolled pain   Complete by:  As directed    Diet - low sodium heart healthy   Complete by:  As directed    Increase activity slowly   Complete by:  As directed      Allergies as of 10/08/2017      Reactions   Penicillins Other (See Comments)   Has not had a reaction, just family history of same Has patient had a PCN reaction  causing immediate rash, facial/tongue/throat swelling, SOB or lightheadedness with hypotension: No Has patient had a PCN reaction causing severe rash involving mucus membranes or skin necrosis: No Has patient had a PCN reaction that required hospitalization: No Has patient had a PCN reaction occurring within the last 10 years: No If all of the above answers are "NO", then may proceed with Cephalosporin use.      Medication List    STOP taking these medications   atorvastatin 10 MG  tablet Commonly known as:  LIPITOR   buPROPion 300 MG 24 hr tablet Commonly known as:  WELLBUTRIN XL   FREESTYLE LIBRE 14 DAY SENSOR Misc   glucose blood test strip   Insulin NPH (Human) (Isophane) 100 UNIT/ML Kiwkpen Commonly known as:  HUMULIN N   traZODone 50 MG tablet Commonly known as:  DESYREL     TAKE these medications   FREESTYLE LIBRE 14 DAY READER Devi 1 Units by Does not apply route continuous.   glucagon 1 MG injection Inject 1 mg into the muscle once as needed. What changed:  reasons to take this   insulin aspart 100 UNIT/ML injection Commonly known as:  novoLOG Take 6 units 3 times a day before meals.   insulin glargine 100 UNIT/ML injection Commonly known as:  LANTUS Lantus 30 units 10/08/2017 night.  Then increase Lantus to 40 units starting 10/09/2017 night.   levothyroxine 175 MCG tablet Commonly known as:  SYNTHROID, LEVOTHROID Take 1 tablet (175 mcg total) by mouth daily before breakfast.   TRIUMEQ 600-50-300 MG tablet Generic drug:  abacavir-dolutegravir-lamiVUDine Take 1 tablet by mouth daily.       Allergies  Allergen Reactions  . Penicillins Other (See Comments)    Has not had a reaction, just family history of same Has patient had a PCN reaction causing immediate rash, facial/tongue/throat swelling, SOB or lightheadedness with hypotension: No Has patient had a PCN reaction causing severe rash involving mucus membranes or skin necrosis: No Has patient had a PCN reaction that required hospitalization: No Has patient had a PCN reaction occurring within the last 10 years: No If all of the above answers are "NO", then may proceed with Cephalosporin use.     Consultations:none  Procedures/Studies: Dg Chest 2 View  Result Date: 10/06/2017 CLINICAL DATA:  Suspected sepsis.  Nausea and vomiting. EXAM: CHEST - 2 VIEW COMPARISON:  None. FINDINGS: The cardiomediastinal contours are normal. The lungs are clear. Pulmonary vasculature is normal. No  consolidation, pleural effusion, or pneumothorax. No acute osseous abnormalities are seen. IMPRESSION: Negative radiographs of the chest. Electronically Signed   By: Narda RutherfordMelanie  Sanford M.D.   On: 10/06/2017 06:45    (Echo, Carotid, EGD, Colonoscopy, ERCP)    Subjective:   Discharge Exam: Vitals:   10/08/17 0012 10/08/17 0508  BP: 129/86 128/85  Pulse: 87 85  Resp: 17 16  Temp: 98.4 F (36.9 C) (!) 97.4 F (36.3 C)  SpO2: 100% 99%   Vitals:   10/07/17 1704 10/07/17 1952 10/08/17 0012 10/08/17 0508  BP: 127/87 123/82 129/86 128/85  Pulse: 100 100 87 85  Resp:  16 17 16   Temp:  97.7 F (36.5 C) 98.4 F (36.9 C) (!) 97.4 F (36.3 C)  TempSrc:      SpO2: 100% 99% 100% 99%  Weight:      Height:        General: Pt is alert, awake, not in acute distress Cardiovascular: RRR, S1/S2 +, no rubs, no gallops Respiratory: CTA bilaterally, no wheezing,  no rhonchi Abdominal: Soft, NT, ND, bowel sounds + Extremities: no edema, no cyanosis    The results of significant diagnostics from this hospitalization (including imaging, microbiology, ancillary and laboratory) are listed below for reference.     Microbiology: Recent Results (from the past 240 hour(s))  Culture, blood (Routine x 2)     Status: None (Preliminary result)   Collection Time: 10/06/17  7:09 AM  Result Value Ref Range Status   Specimen Description   Final    BLOOD LEFT ARM Performed at [facility], [address], [place], [place] [address]    Special Requests   Final    BOTTLES DRAWN AEROBIC AND ANAEROBIC Blood Culture adequate volume Performed at [facility], [address], [place], [place] [address]    Culture   Final    NO GROWTH < 24 HOURS Performed at [facility], [address], [place], [place] [address]    Report Status PENDING  Incomplete  Culture, blood (Routine x 2)     Status: None (Preliminary result)   Collection Time: 10/06/17  7:20 AM  Result  Value Ref Range Status   Specimen Description   Final    BLOOD RIGHT HAND Performed at [facility], [address] [place], [place] [address]    Special Requests   Final    BOTTLES DRAWN AEROBIC AND ANAEROBIC Blood Culture adequate volume Performed at [facility], [address], [place], [place] [address]    Culture   Final    NO GROWTH < 24 HOURS Performed at [facility], [address] [place], [place] [address]    Report Status PENDING  Incomplete  MRSA PCR Screening     Status: Abnormal   Collection Time: 10/06/17 12:44 PM  Result Value Ref Range Status   MRSA by PCR POSITIVE (A) NEGATIVE Final    Comment:        The GeneXpert MRSA Assay (FDA approved for NASAL specimens only), is one component of a comprehensive MRSA colonization surveillance program. It is not intended to diagnose MRSA infection nor to guide or monitor treatment for MRSA infections. RESULT CALLED TO, READ BACK BY AND VERIFIED WITH: Ma Hillock 10/06/17 1612 RHOLMES Performed at [facility], [address] [place], [place] [address]      Labs: BNP (last 3 results) No results for input(s): BNP in the last 8760 hours. Basic Metabolic Panel: Recent Labs  Lab 10/07/17 0320 10/07/17 0759 10/07/17 1257 10/08/17 0544 10/08/17 0819  NA 134* 134* 137 QUESTIONABLE RESULTS, RECOMMEND RECOLLECT TO VERIFY 137  K 3.3* 3.8 3.5 QUESTIONABLE RESULTS, RECOMMEND RECOLLECT TO VERIFY 3.9  CL 106 104 107 QUESTIONABLE RESULTS, RECOMMEND RECOLLECT TO VERIFY 102  CO2 18* 21* 22 QUESTIONABLE RESULTS, RECOMMEND RECOLLECT TO VERIFY 24  GLUCOSE 140* 171* 181* QUESTIONABLE RESULTS, RECOMMEND RECOLLECT TO VERIFY 315*  BUN 11 10 9  QUESTIONABLE RESULTS, RECOMMEND RECOLLECT TO VERIFY 8  CREATININE 0.74 0.76 0.66 QUESTIONABLE RESULTS, RECOMMEND RECOLLECT TO VERIFY 0.69  CALCIUM 8.3* 8.5* 8.9 QUESTIONABLE RESULTS, RECOMMEND RECOLLECT TO VERIFY 8.8*  MG  --    --   --  QUESTIONABLE RESULTS, RECOMMEND RECOLLECT TO VERIFY 2.1   Liver Function Tests: Recent Labs  Lab 10/06/17 0855 10/08/17 0544 10/08/17 0819  AST 15 QUESTIONABLE RESULTS, RECOMMEND RECOLLECT TO VERIFY 11*  ALT 17 QUESTIONABLE RESULTS, RECOMMEND RECOLLECT TO VERIFY 12  ALKPHOS 69 QUESTIONABLE RESULTS, RECOMMEND RECOLLECT TO VERIFY 49  BILITOT 2.1* QUESTIONABLE RESULTS, RECOMMEND RECOLLECT TO VERIFY 1.1  PROT 8.1 QUESTIONABLE RESULTS, RECOMMEND RECOLLECT TO VERIFY 6.7  ALBUMIN 4.9 QUESTIONABLE RESULTS, RECOMMEND RECOLLECT TO VERIFY 3.8   No results for input(s): LIPASE, AMYLASE in the last 168 hours. No results for input(s): AMMONIA in the last 168 hours. CBC: Recent Labs  Lab 10/06/17 0709 10/08/17 0819  WBC 18.6* 3.9*  NEUTROABS 16.9*  --   HGB 17.2* 13.3  HCT 46.8 36.9*  MCV 91.2 90.0  PLT 330 189   Cardiac Enzymes: No results for input(s): CKTOTAL, CKMB, CKMBINDEX, TROPONINI in the last 168 hours. BNP: Invalid input(s): POCBNP CBG: Recent Labs  Lab 10/07/17 1255 10/07/17 1350 10/07/17 1652 10/07/17 1953 10/08/17 0730  GLUCAP 171* 152* 269* 234* 275*   D-Dimer No results for input(s): DDIMER in the last 72 hours. Hgb A1c No results for input(s): HGBA1C in the last 72 hours. Lipid Profile No results for input(s): CHOL, HDL, LDLCALC, TRIG, CHOLHDL, LDLDIRECT in the last 72 hours. Thyroid function studies No results for input(s): TSH, T4TOTAL, T3FREE, THYROIDAB in the last 72 hours.  Invalid input(s): FREET3 Anemia work up No results for input(s): VITAMINB12, FOLATE, FERRITIN, TIBC, IRON, RETICCTPCT in the last 72 hours. Urinalysis    Component Value Date/Time   COLORURINE YELLOW 10/06/2017 0921   APPEARANCEUR CLEAR 10/06/2017 0921   LABSPEC 1.022 10/06/2017 0921   PHURINE 5.0 10/06/2017 0921   GLUCOSEU >=500 (A) 10/06/2017 0921   GLUCOSEU >=1000 (A) 02/24/2016 0930   HGBUR SMALL (A) 10/06/2017 0921   BILIRUBINUR NEGATIVE 10/06/2017 0921    KETONESUR 80 (A) 10/06/2017 0921   PROTEINUR 30 (A) 10/06/2017 0921   UROBILINOGEN 0.2 02/24/2016 0930   NITRITE NEGATIVE 10/06/2017 0921   LEUKOCYTESUR NEGATIVE 10/06/2017 0921   Sepsis Labs Invalid input(s): PROCALCITONIN,  WBC,  LACTICIDVEN Microbiology Recent Results (from the past 240 hour(s))  Culture, blood (Routine x 2)     Status: None (Preliminary result)   Collection Time: 10/06/17  7:09 AM  Result Value Ref Range Status   Specimen Description   Final    BLOOD LEFT ARM Performed at [facility], [address], [place], [place] [address]    Special Requests   Final    BOTTLES DRAWN AEROBIC AND ANAEROBIC Blood Culture adequate volume Performed at [facility], [address], [place], [place] [address]    Culture   Final    NO GROWTH < 24 HOURS Performed at [facility], [address], [place], [place] [address]    Report Status PENDING  Incomplete  Culture, blood (Routine x 2)     Status: None (Preliminary result)   Collection Time: 10/06/17  7:20 AM  Result Value Ref Range Status   Specimen Description   Final    BLOOD RIGHT HAND Performed at [facility], [address], [place], [place] [address]    Special Requests   Final    BOTTLES DRAWN AEROBIC AND ANAEROBIC Blood Culture adequate volume Performed at [facility], [address], [place], [place] [address]    Culture   Final    NO GROWTH < 24 HOURS Performed at [facility], [address] [place], [place] [address]    Report Status PENDING  Incomplete  MRSA PCR Screening     Status: Abnormal   Collection Time: 10/06/17 12:44 PM  Result Value Ref Range Status   MRSA by PCR POSITIVE (A) NEGATIVE Final  Comment:        The GeneXpert MRSA Assay (FDA approved for NASAL specimens only), is one component of a comprehensive MRSA colonization surveillance program. It is not intended to diagnose MRSA infection nor  to guide or monitor treatment for MRSA infections. RESULT CALLED TO, READ BACK BY AND VERIFIED WITH: Ma Hillock 10/06/17 1612 RHOLMES Performed at [facility], [address] [place], [place] [address]      Time coordinating discharge: Over 30 minutes  SIGNED:   Alwyn Ren, MD  [facility] 10/08/2017, 10:15 AM Pager   If 7PM-7AM, please contact night-coverage [email or web address] Password [email or web address]

## 2017-10-09 ENCOUNTER — Encounter: Payer: Self-pay | Admitting: Family Medicine

## 2017-10-09 ENCOUNTER — Telehealth: Payer: Self-pay | Admitting: *Deleted

## 2017-10-09 NOTE — Telephone Encounter (Signed)
Lm requesting return call to complete TCM and confirm hosp f/u appt  

## 2017-10-10 NOTE — Telephone Encounter (Signed)
Lm requesting return call to complete TCM and confirm hosp f/u appt  

## 2017-10-11 ENCOUNTER — Encounter: Payer: Self-pay | Admitting: Family Medicine

## 2017-10-11 ENCOUNTER — Ambulatory Visit: Payer: Managed Care, Other (non HMO) | Admitting: Family Medicine

## 2017-10-11 VITALS — BP 138/80 | HR 120 | Temp 97.5°F | Resp 16 | Ht 71.0 in | Wt 206.0 lb

## 2017-10-11 DIAGNOSIS — Z09 Encounter for follow-up examination after completed treatment for conditions other than malignant neoplasm: Secondary | ICD-10-CM

## 2017-10-11 DIAGNOSIS — D696 Thrombocytopenia, unspecified: Secondary | ICD-10-CM | POA: Diagnosis not present

## 2017-10-11 DIAGNOSIS — E1065 Type 1 diabetes mellitus with hyperglycemia: Secondary | ICD-10-CM | POA: Diagnosis not present

## 2017-10-11 DIAGNOSIS — E039 Hypothyroidism, unspecified: Secondary | ICD-10-CM | POA: Diagnosis not present

## 2017-10-11 DIAGNOSIS — Z21 Asymptomatic human immunodeficiency virus [HIV] infection status: Secondary | ICD-10-CM

## 2017-10-11 DIAGNOSIS — L0211 Cutaneous abscess of neck: Secondary | ICD-10-CM

## 2017-10-11 LAB — CBC WITH DIFFERENTIAL/PLATELET
Basophils Absolute: 0 10*3/uL (ref 0.0–0.1)
Basophils Relative: 0.4 % (ref 0.0–3.0)
EOS ABS: 0 10*3/uL (ref 0.0–0.7)
Eosinophils Relative: 0.7 % (ref 0.0–5.0)
HEMATOCRIT: 42 % (ref 39.0–52.0)
Hemoglobin: 14.6 g/dL (ref 13.0–17.0)
LYMPHS ABS: 1.2 10*3/uL (ref 0.7–4.0)
Lymphocytes Relative: 20.1 % (ref 12.0–46.0)
MCHC: 34.8 g/dL (ref 30.0–36.0)
MCV: 93.1 fl (ref 78.0–100.0)
MONOS PCT: 5 % (ref 3.0–12.0)
Monocytes Absolute: 0.3 10*3/uL (ref 0.1–1.0)
NEUTROS ABS: 4.5 10*3/uL (ref 1.4–7.7)
NEUTROS PCT: 73.8 % (ref 43.0–77.0)
PLATELETS: 212 10*3/uL (ref 150.0–400.0)
RBC: 4.51 Mil/uL (ref 4.22–5.81)
RDW: 12 % (ref 11.5–15.5)
WBC: 6.1 10*3/uL (ref 4.0–10.5)

## 2017-10-11 LAB — BASIC METABOLIC PANEL
BUN: 11 mg/dL (ref 6–23)
CHLORIDE: 103 meq/L (ref 96–112)
CO2: 33 mEq/L — ABNORMAL HIGH (ref 19–32)
Calcium: 9.3 mg/dL (ref 8.4–10.5)
Creatinine, Ser: 0.72 mg/dL (ref 0.40–1.50)
GFR: 138.28 mL/min (ref >60.00)
Glucose, Bld: 150 mg/dL — ABNORMAL HIGH (ref 70–99)
POTASSIUM: 3.8 meq/L (ref 3.5–5.1)
Sodium: 144 mEq/L (ref 135–145)

## 2017-10-11 LAB — CULTURE, BLOOD (ROUTINE X 2)
CULTURE: NO GROWTH
Culture: NO GROWTH
Special Requests: ADEQUATE
Special Requests: ADEQUATE

## 2017-10-11 LAB — TSH: TSH: 2.93 u[IU]/mL (ref 0.35–4.50)

## 2017-10-11 MED ORDER — FREESTYLE LIBRE 14 DAY SENSOR MISC: 1.0000 [IU] | Freq: Every day | 3 refills | Status: DC | PRN

## 2017-10-11 MED ORDER — DOXYCYCLINE HYCLATE 100 MG PO CAPS: 100.0000 mg | ORAL_CAPSULE | Freq: Two times a day (BID) | ORAL | 0 refills | Status: DC

## 2017-10-11 MED ORDER — FREESTYLE LIBRE 14 DAY READER DEVI: 1.0000 [IU] | Freq: Every day | 0 refills | Status: DC | PRN

## 2017-10-11 NOTE — Progress Notes (Signed)
Subjective:    Patient ID: David Leon, male    DOB: Sep 20, 1990, 27 y.o.   MRN: [id number]  HPI This is a 27 yo male who presents today for follow up of recent hospitalization for DKA. Was admitted 10/06/17-10/08/17 following increased blood sugar, polydipsia, polyuria, nausea and vomiting. Had change in insulin therapy prior. Since discharge, he has been regaining his strength and has gone back to work.   Currently taking insulin glargine 40 units, novolog 15 units prn. Blood sugars have been 100s.   Has had boil come up on right side of neck 2 days ago. No fever, chills or myalgias. Had an area on his left inner thigh that has improved with warm compresses.   HIV- continued follow up with ID at [facility]. Will get flu vaccine at his upcoming follow up visit.   Current Meds  Medication Sig  . abacavir-dolutegravir-lamiVUDine (TRIUMEQ) 600-50-300 MG tablet Take 1 tablet by mouth daily.  Marland Kitchen glucagon 1 MG injection Inject 1 mg into the muscle once as needed. (Patient taking differently: Inject 1 mg into the muscle once as needed (for low blood sugar). )  . insulin aspart (NOVOLOG) 100 UNIT/ML FlexPen Inject 10 Units into the skin 3 (three) times daily with meals.  . Insulin Glargine (LANTUS) 100 UNIT/ML Solostar Pen Lantus 40 units subcu nightly  . Insulin Pen Needle 31G X 5 MM MISC 40 Units by Does not apply route at bedtime.  Marland Kitchen levothyroxine (SYNTHROID, LEVOTHROID) 175 MCG tablet Take 1 tablet (175 mcg total) by mouth daily before breakfast.     Past Medical History:  Diagnosis Date  . Diabetes mellitus without complication (HCC)   . Diabetic ketoacidosis without coma associated with type 1 diabetes mellitus (HCC)   . Hyperlipidemia 09/11/2014  . Thrombocytopenia (HCC) 09/10/2014  . Thyroid disease    No past surgical history on file. Family History  Problem Relation Age of Onset  . Diabetes Father    Social History   Tobacco Use  . Smoking status: Former Games developer  . Smokeless  tobacco: Never Used  Substance Use Topics  . Alcohol use: No  . Drug use: No     Review of Systems Per HPI    Objective:   Physical Exam  Constitutional: He is oriented to person, place, and time. He appears well-developed and well-nourished.  HENT:  Head: Normocephalic and atraumatic.  Eyes: Conjunctivae are normal.  Neck:    Pulmonary/Chest: Effort normal.  Musculoskeletal: Normal range of motion. He exhibits no edema.  Neurological: He is alert and oriented to person, place, and time.  Skin: Skin is warm and dry.  Psychiatric: He has a normal mood and affect. His behavior is normal. Judgment and thought content normal.  Vitals reviewed.        BP 138/80 (BP Location: Left Arm, Patient Position: Sitting, Cuff Size: Normal)   Pulse (!) 120   Temp (!) 97.5 F (36.4 C) (Oral)   Resp 16   Ht 5\' 11"  (1.803 m)   Wt 206 lb (93.4 kg)   SpO2 98%   BMI 28.73 kg/m  Wt Readings from Last 3 Encounters:  10/11/17 206 lb (93.4 kg)  10/06/17 195 lb 15.8 oz (88.9 kg)  08/19/17 209 lb 12.8 oz (95.2 kg)    Assessment & Plan:  1. Hospital discharge follow-up - reviewed medications, pmh/psh/soch - continue current meds  2. Uncontrolled type 1 diabetes mellitus with hyperglycemia (HCC) - discussed using Freestyle Libre and encouraged him to start  using to more closely monitor blood glucose readings - Basic Metabolic Panel - CBC with Differential - HgB A1c - Continuous Blood Gluc Receiver (FREESTYLE LIBRE 14 DAY READER) DEVI; 1 Units by Does not apply route daily as needed.  Dispense: 1 Device; Refill: 0 - Continuous Blood Gluc Sensor (FREESTYLE LIBRE 14 DAY SENSOR) MISC; 1 Units by Does not apply route daily as needed.  Dispense: 6 each; Refill: 3 - follow up as scheduled with endocrinology - follow up in 3 months  3. Thrombocytopenia (HCC) - CBC with Differential  4. Acquired hypothyroidism - TSH  5. Abscess of neck - previously with MRSA of face, will cover this with  doxycycline. Discussed potential side effects and need for protection against sun - RTC/ER precautions reviewed - doxycycline (VIBRAMYCIN) 100 MG capsule; Take 1 capsule (100 mg total) by mouth 2 (two) times daily.  Dispense: 14 capsule; Refill: 0 - Wound culture  6. Asymptomatic HIV infection (HCC) - [facility]   Deborah Gessner, FNP-BC  [facility], [facility]  10/11/2017 8:46 PM

## 2017-10-11 NOTE — Patient Instructions (Addendum)
Can take over the counter- ranitidine 150 - 300 mg twice a day for acid reflux  Warm compresses to neck 4 or more times a day

## 2017-10-14 ENCOUNTER — Other Ambulatory Visit (INDEPENDENT_AMBULATORY_CARE_PROVIDER_SITE_OTHER): Payer: Managed Care, Other (non HMO)

## 2017-10-14 DIAGNOSIS — E119 Type 2 diabetes mellitus without complications: Secondary | ICD-10-CM | POA: Diagnosis not present

## 2017-10-14 LAB — WOUND CULTURE
MICRO NUMBER: 91132470
SPECIMEN QUALITY: ADEQUATE

## 2017-10-14 LAB — HEMOGLOBIN A1C: Hgb A1c MFr Bld: 10.9 % — ABNORMAL HIGH (ref 4.6–6.5)

## 2017-10-21 ENCOUNTER — Ambulatory Visit: Payer: Managed Care, Other (non HMO) | Admitting: Endocrinology

## 2017-11-25 ENCOUNTER — Telehealth: Payer: Self-pay | Admitting: Endocrinology

## 2017-11-25 ENCOUNTER — Other Ambulatory Visit: Payer: Self-pay | Admitting: Family Medicine

## 2017-11-25 ENCOUNTER — Other Ambulatory Visit: Payer: Self-pay | Admitting: Endocrinology

## 2017-11-25 NOTE — Telephone Encounter (Signed)
levothyroxine (SYNTHROID, LEVOTHROID) 175 MCG tablet   Patient stated that refills were going to be sent in at patients last visit with Dr Everardo All. Patient has contacted pharmacy and they have stated that this was never received     [facility] [address], [place] - [address]

## 2017-11-25 NOTE — Telephone Encounter (Signed)
E-Prescribing Status: Receipt confirmed by pharmacy (11/25/2017 11:38 AM EST) Called pt to make him aware to call his pharmacy for a status update. Pt further added that his pharmacy called to inform him that his Rx is available for pick up.

## 2017-12-06 ENCOUNTER — Emergency Department (HOSPITAL_COMMUNITY)
Admission: EM | Admit: 2017-12-06 | Discharge: 2017-12-07 | Disposition: A | Discharge status: Home / Self Care | Payer: Managed Care, Other (non HMO) | Attending: Emergency Medicine | Admitting: Emergency Medicine

## 2017-12-06 ENCOUNTER — Emergency Department (HOSPITAL_COMMUNITY): Payer: Managed Care, Other (non HMO)

## 2017-12-06 ENCOUNTER — Encounter (HOSPITAL_COMMUNITY): Payer: Self-pay | Admitting: Emergency Medicine

## 2017-12-06 DIAGNOSIS — E109 Type 1 diabetes mellitus without complications: Secondary | ICD-10-CM | POA: Diagnosis not present

## 2017-12-06 DIAGNOSIS — E039 Hypothyroidism, unspecified: Secondary | ICD-10-CM | POA: Diagnosis not present

## 2017-12-06 DIAGNOSIS — R519 Headache, unspecified: Secondary | ICD-10-CM

## 2017-12-06 DIAGNOSIS — R109 Unspecified abdominal pain: Secondary | ICD-10-CM | POA: Diagnosis not present

## 2017-12-06 DIAGNOSIS — Z87891 Personal history of nicotine dependence: Secondary | ICD-10-CM | POA: Diagnosis not present

## 2017-12-06 DIAGNOSIS — Z794 Long term (current) use of insulin: Secondary | ICD-10-CM | POA: Diagnosis not present

## 2017-12-06 DIAGNOSIS — R509 Fever, unspecified: Secondary | ICD-10-CM

## 2017-12-06 DIAGNOSIS — Z79899 Other long term (current) drug therapy: Secondary | ICD-10-CM | POA: Insufficient documentation

## 2017-12-06 DIAGNOSIS — Z21 Asymptomatic human immunodeficiency virus [HIV] infection status: Secondary | ICD-10-CM | POA: Diagnosis not present

## 2017-12-06 DIAGNOSIS — R51 Headache: Secondary | ICD-10-CM | POA: Diagnosis not present

## 2017-12-06 LAB — COMPREHENSIVE METABOLIC PANEL
ALT: 11 U/L (ref 0–44)
AST: 13 U/L — ABNORMAL LOW (ref 15–41)
Albumin: 4.7 g/dL (ref 3.5–5.0)
Alkaline Phosphatase: 47 U/L (ref 38–126)
Anion gap: 7 (ref 5–15)
BUN: 7 mg/dL (ref 6–20)
CHLORIDE: 105 mmol/L (ref 98–111)
CO2: 26 mmol/L (ref 22–32)
Calcium: 9.5 mg/dL (ref 8.9–10.3)
Creatinine, Ser: 0.86 mg/dL (ref 0.61–1.24)
Glucose, Bld: 260 mg/dL — ABNORMAL HIGH (ref 70–99)
POTASSIUM: 3.9 mmol/L (ref 3.5–5.1)
Sodium: 138 mmol/L (ref 135–145)
Total Bilirubin: 0.5 mg/dL (ref 0.3–1.2)
Total Protein: 7.6 g/dL (ref 6.5–8.1)

## 2017-12-06 LAB — URINALYSIS, ROUTINE W REFLEX MICROSCOPIC
Bacteria, UA: NONE SEEN
Bilirubin Urine: NEGATIVE
Glucose, UA: >=500 mg/dL — AB
HGB URINE DIPSTICK: NEGATIVE
KETONES UR: 20 mg/dL — AB
Leukocytes, UA: NEGATIVE
NITRITE: NEGATIVE
PROTEIN: NEGATIVE mg/dL
Specific Gravity, Urine: 1.041 — ABNORMAL HIGH (ref 1.005–1.030)
pH: 6 (ref 5.0–8.0)

## 2017-12-06 LAB — CBC
HCT: 45.7 % (ref 39.0–52.0)
Hemoglobin: 15.8 g/dL (ref 13.0–17.0)
MCH: 32.2 pg (ref 26.0–34.0)
MCHC: 34.6 g/dL (ref 30.0–36.0)
MCV: 93.3 fL (ref 80.0–100.0)
NRBC: 0 % (ref 0.0–0.2)
PLATELETS: 205 10*3/uL (ref 150–400)
RBC: 4.9 MIL/uL (ref 4.22–5.81)
RDW: 11.2 % — AB (ref 11.5–15.5)
WBC: 6.7 10*3/uL (ref 4.0–10.5)

## 2017-12-06 MED ORDER — IOPAMIDOL (ISOVUE-300) INJECTION 61%
INTRAVENOUS | Status: AC
  Filled 2017-12-06: qty 100

## 2017-12-06 MED ORDER — IOPAMIDOL (ISOVUE-300) INJECTION 61%
100.0000 mL | Freq: Once | INTRAVENOUS | Status: AC | PRN
  Administered 2017-12-06: 100 mL via INTRAVENOUS

## 2017-12-06 MED ORDER — SODIUM CHLORIDE (PF) 0.9 % IJ SOLN
INTRAMUSCULAR | Status: AC
  Filled 2017-12-06: qty 50

## 2017-12-06 MED ORDER — SODIUM CHLORIDE 0.9 % IV BOLUS
1000.0000 mL | Freq: Once | INTRAVENOUS | Status: AC
  Administered 2017-12-06: 1000 mL via INTRAVENOUS

## 2017-12-06 NOTE — ED Triage Notes (Addendum)
Patient here from home with complaints of lower back pain and fever x2 days. Reports that he was seen by PCP and they checked his urine. States that he should have come yesterday but went to sleep. Type 1 diabetic.

## 2017-12-06 NOTE — ED Notes (Signed)
Pt reports generalized fatigue the last 2 days also

## 2017-12-06 NOTE — ED Provider Notes (Signed)
[facility]-EMERGENCY DEPT Provider Note   CSN: [id number] Arrival date & time: 12/06/17  2036     History   Chief Complaint Chief Complaint  Patient presents with  . Fever  . Back Pain    HPI David Leon is a 27 y.o. male.  Patient c/o fever to 103 and mid to lower abd pain in the past day. Symptoms acute onset, persistent, constant, moderate, non radiating. Pt denies dysuria or hematuria. Having normal bms, no diarrhea. No vomiting. Denies cough or uri symptoms. No chest pain or sob. Denies extremity pain or swelling. No skin lesions or rash. Hx iddm, states compliant w meds. No polyuria or polydipsia.   The history is provided by the patient.    Past Medical History:  Diagnosis Date  . Diabetes mellitus without complication (HCC)   . Diabetic ketoacidosis without coma associated with type 1 diabetes mellitus (HCC)   . Hyperlipidemia 09/11/2014  . Thrombocytopenia (HCC) 09/10/2014  . Thyroid disease     Patient Active Problem List   Diagnosis Date Noted  . Asymptomatic HIV infection (HCC) 10/11/2017  . Dehydration   . DKA, type 1 (HCC) 10/06/2017  . Type 1 diabetes mellitus without complications (HCC) 04/08/2017  . Wellness examination 02/24/2016  . Diabetes (HCC) 09/23/2014  . Hyperlipidemia 09/11/2014  . Fluid volume depletion 09/10/2014  . Thrombocytopenia (HCC) 09/10/2014  . Hypothyroidism 09/10/2014  . Hyponatremia 09/10/2014  . Diabetic ketoacidosis without coma associated with type 1 diabetes mellitus (HCC)     History reviewed. No pertinent surgical history.      Home Medications    Prior to Admission medications   Medication Sig Start Date End Date Taking? Authorizing Provider  abacavir-dolutegravir-lamiVUDine (TRIUMEQ) 600-50-300 MG tablet Take 1 tablet by mouth daily.    [provider]  buPROPion (WELLBUTRIN XL) 300 MG 24 hr tablet Take 1 tablet (300 mg total) by mouth daily. 11/25/17   [name], [name], FNP   Continuous Blood Gluc Receiver (FREESTYLE LIBRE 14 DAY READER) DEVI 1 Units by Does not apply route daily as needed. 10/11/17   [name], [name], FNP  Continuous Blood Gluc Sensor (FREESTYLE LIBRE 14 DAY SENSOR) MISC 1 Units by Does not apply route daily as needed. 10/11/17   [name], [name], FNP  doxycycline (VIBRAMYCIN) 100 MG capsule Take 1 capsule (100 mg total) by mouth 2 (two) times daily. 10/11/17   [name], [name], FNP  glucagon 1 MG injection Inject 1 mg into the muscle once as needed. Patient taking differently: Inject 1 mg into the muscle once as needed (for low blood sugar).  09/23/14   Romero BellingEllison, Sean, MD  insulin aspart (NOVOLOG) 100 UNIT/ML FlexPen Inject 10 Units into the skin 3 (three) times daily with meals. 10/08/17   Alwyn RenMathews, Elizabeth G, MD  Insulin Glargine (LANTUS) 100 UNIT/ML Solostar Pen Lantus 40 units subcu nightly 10/08/17   Alwyn RenMathews, Elizabeth G, MD  Insulin Pen Needle 31G X 5 MM MISC 40 Units by Does not apply route at bedtime. 10/08/17   Alwyn RenMathews, Elizabeth G, MD  levothyroxine (SYNTHROID, LEVOTHROID) 175 MCG tablet TAKE 1 TABLET BY MOUTH ONCE DAILY BEFORE BREAKFAST 11/25/17   Romero BellingEllison, Sean, MD    Family History Family History  Problem Relation Age of Onset  . Diabetes Father     Social History Social History   Tobacco Use  . Smoking status: Former Games developermoker  . Smokeless tobacco: Never Used  Substance Use Topics  . Alcohol use: No  .  Drug use: No     Allergies   Penicillins   Review of Systems Review of Systems  Constitutional: Negative for fever.  HENT: Negative for sore throat.   Eyes: Negative for redness.  Respiratory: Negative for cough and shortness of breath.   Cardiovascular: Negative for chest pain.  Gastrointestinal: Positive for abdominal pain. Negative for vomiting.  Endocrine: Negative for polyuria.  Genitourinary: Negative for flank pain.  Musculoskeletal: Negative for neck pain and neck stiffness.  Skin: Negative for rash.    Neurological: Negative for headaches.  Hematological: Does not bruise/bleed easily.  Psychiatric/Behavioral: Negative for confusion.     Physical Exam Updated Vital Signs BP (!) 157/110 (BP Location: Right Arm)   Pulse 89   Temp 99.1 F (37.3 C) (Oral)   Resp 19   SpO2 100%   Physical Exam  Constitutional: He appears well-developed and well-nourished.  HENT:  Mouth/Throat: Oropharynx is clear and moist.  Eyes: Pupils are equal, round, and reactive to light. Conjunctivae are normal.  Neck: Normal range of motion. Neck supple. No tracheal deviation present.  No stiffness or rigidity.  Cardiovascular: Normal rate, regular rhythm, normal heart sounds and intact distal pulses. Exam reveals no gallop and no friction rub.  No murmur heard. Pulmonary/Chest: Effort normal and breath sounds normal. No accessory muscle usage. No respiratory distress.  Abdominal: Soft. Bowel sounds are normal. He exhibits no distension. There is no tenderness.  Genitourinary:  Genitourinary Comments: No cva tenderness. No scrotal/testicular pain or tenderness.   Musculoskeletal: He exhibits no edema or tenderness.  Lymphadenopathy:    He has no cervical adenopathy.  Neurological: He is alert.  Speech clear/fluent, steady gait.   Skin: Skin is warm and dry. No rash noted.  No skin lesions, no finger/nail lesions.   Psychiatric: He has a normal mood and affect.  Nursing note and vitals reviewed.    ED Treatments / Results  Labs (all labs ordered are listed, but only abnormal results are displayed) Results for orders placed or performed during the hospital encounter of 12/06/17  CBC  Result Value Ref Range   WBC 6.7 4.0 - 10.5 K/uL   RBC 4.90 4.22 - 5.81 MIL/uL   Hemoglobin 15.8 13.0 - 17.0 g/dL   HCT 16.1 09.6 - 04.5 %   MCV 93.3 80.0 - 100.0 fL   MCH 32.2 26.0 - 34.0 pg   MCHC 34.6 30.0 - 36.0 g/dL   RDW 40.9 (L) 81.1 - 91.4 %   Platelets 205 150 - 400 K/uL   nRBC 0.0 0.0 - 0.2 %   Comprehensive metabolic panel  Result Value Ref Range   Sodium 138 135 - 145 mmol/L   Potassium 3.9 3.5 - 5.1 mmol/L   Chloride 105 98 - 111 mmol/L   CO2 26 22 - 32 mmol/L   Glucose, Bld 260 (H) 70 - 99 mg/dL   BUN 7 6 - 20 mg/dL   Creatinine, Ser 7.82 0.61 - 1.24 mg/dL   Calcium 9.5 8.9 - 95.6 mg/dL   Total Protein 7.6 6.5 - 8.1 g/dL   Albumin 4.7 3.5 - 5.0 g/dL   AST 13 (L) 15 - 41 U/L   ALT 11 0 - 44 U/L   Alkaline Phosphatase 47 38 - 126 U/L   Total Bilirubin 0.5 0.3 - 1.2 mg/dL   GFR calc non Af Amer >60 >60 mL/min   GFR calc Af Amer >60 >60 mL/min   Anion gap 7 5 - 15    EKG  None  Radiology No results found.  Procedures Procedures (including critical care time)  Medications Ordered in ED Medications  sodium chloride 0.9 % bolus 1,000 mL (has no administration in time range)     Initial Impression / Assessment and Plan / ED Course  I have reviewed the triage vital signs and the nursing notes.  Pertinent labs & imaging results that were available during my care of the patient were reviewed by me and considered in my medical decision making (see chart for details).  Iv ns bolus. Labs.   Reviewed nursing notes and prior charts for additional history.   Labs reviewed - chem w glucose 260, hco3 normal.  UA pending.  Signed out to Dr Erin Hearing to check UA, CT, and dispo appropriately.     Final Clinical Impressions(s) / ED Diagnoses   Final diagnoses:  None    ED Discharge Orders    None       Cathren Laine, MD 12/06/17 2249

## 2017-12-06 NOTE — ED Provider Notes (Signed)
7:18 AM Assumed care from Dr. Denton LankSteinl, please see their note for full history, physical and decision making until this point. In brief this is a [age] y.o. year old male who presented to the ED tonight with Fever and Back Pain     Fever at urgent care with abdominal tenderness here.  Pending repeat urinalysis and CT scan to evaluate for intra-abdominal causes.  Likely discharge.  CT ok. Urine ok. Labs ok. Slightly high glucose but not DKA.   Reevaluation, now with slightly higher HR. Headache. No neck stiffness. Back pain bilaterally. Discussed possibility of meningitis but patient is hesitant about LP at this time. Discussed that bacterial meningitis can be deadly, but he wants to try tylenol and oral fluids for time being. Will add on influenza and strep as well. Tylenol for headache and likely fever. reeval for disposition.   Tylenol helped headache considerably and HR improved to <100 as well. Patient doesn't want to go through with lumbar puncture at this time and I feel it that is reasonable decision.  Discussed with him the signs and symptoms of bacterial meningitis and reasons to return to the emergency department for which she will do.  Discharge instructions, including strict return precautions for new or worsening symptoms, given. Patient and/or family verbalized understanding and agreement with the plan as described.   Labs, studies and imaging reviewed by myself and considered in medical decision making if ordered. Imaging interpreted by radiology.  Labs Reviewed  CBC - Abnormal; Notable for the following components:      Result Value   RDW 11.2 (*)    All other components within normal limits  COMPREHENSIVE METABOLIC PANEL - Abnormal; Notable for the following components:   Glucose, Bld 260 (*)    AST 13 (*)    All other components within normal limits  URINALYSIS, ROUTINE W REFLEX MICROSCOPIC - Abnormal; Notable for the following components:   Specific Gravity, Urine 1.041 (*)    Glucose, UA >=500 (*)    Ketones, ur 20 (*)    All other components within normal limits  GROUP A STREP BY PCR  INFLUENZA PANEL BY PCR (TYPE A & B)    CT Abdomen Pelvis W Contrast  Final Result      No follow-ups on file.    David Leon, David Zuckerman, MD 12/07/17 [phone]

## 2017-12-07 LAB — INFLUENZA PANEL BY PCR (TYPE A & B)
Influenza A By PCR: NEGATIVE
Influenza B By PCR: NEGATIVE

## 2017-12-07 LAB — GROUP A STREP BY PCR: GROUP A STREP BY PCR: NOT DETECTED

## 2017-12-07 MED ORDER — ACETAMINOPHEN 500 MG PO TABS
1000.0000 mg | ORAL_TABLET | Freq: Once | ORAL | Status: AC
  Administered 2017-12-07: 1000 mg via ORAL
  Filled 2017-12-07: qty 2

## 2017-12-16 ENCOUNTER — Other Ambulatory Visit: Payer: Self-pay | Admitting: Family Medicine

## 2017-12-17 ENCOUNTER — Other Ambulatory Visit: Payer: Self-pay

## 2017-12-17 ENCOUNTER — Telehealth: Payer: Self-pay | Admitting: Endocrinology

## 2017-12-17 MED ORDER — BASAGLAR KWIKPEN 100 UNIT/ML ~~LOC~~ SOPN: PEN_INJECTOR | SUBCUTANEOUS | 4 refills | Status: DC

## 2017-12-17 NOTE — Telephone Encounter (Signed)
This has been done.

## 2017-12-17 NOTE — Telephone Encounter (Signed)
Medication was Dc'd by Dr. Everardo AllEllison 08/19/17

## 2017-12-17 NOTE — Telephone Encounter (Signed)
Patient stated he has contacted pharmacy and he has ran out of refills on his Basaglar. Please Advise, thanks [facility] [address] - [place], [place]

## 2018-01-10 ENCOUNTER — Ambulatory Visit: Payer: Managed Care, Other (non HMO) | Admitting: Family Medicine

## 2018-01-10 ENCOUNTER — Encounter: Payer: Self-pay | Admitting: Family Medicine

## 2018-01-10 DIAGNOSIS — Z0289 Encounter for other administrative examinations: Secondary | ICD-10-CM

## 2018-01-24 ENCOUNTER — Telehealth: Payer: Self-pay | Admitting: Family Medicine

## 2018-01-24 ENCOUNTER — Ambulatory Visit: Payer: Managed Care, Other (non HMO) | Admitting: Family Medicine

## 2018-01-24 DIAGNOSIS — Z0289 Encounter for other administrative examinations: Secondary | ICD-10-CM

## 2018-01-24 NOTE — Telephone Encounter (Signed)
Pt called at 3:25 pm for a 3pm appt and said he was stuck in traffic but should be here in a few minutes b/c he was on the exit ramp. I explained it would be up to Debbie if he was seen b/c she had a full schedule. When he arrived [place] spoke with Eunice Blase and she agreed to see him but to let him know it might be a wait b/c her rooms were full. He was told this and said he did not have time to wait b/c he had an appt in [place]. He called back to speak with me and asked if he would be billed a no show charge. He said the traffic was not his fault and he did not feel he should be responsible for paying it. I told him I would send a msg to [facility] on his behalf. He asked for my name and said he would send the msg.

## 2018-03-03 ENCOUNTER — Other Ambulatory Visit: Payer: Self-pay | Admitting: General Practice

## 2018-03-03 MED ORDER — LEVOTHYROXINE SODIUM 175 MCG PO TABS: ORAL_TABLET | ORAL | 0 refills | Status: DC

## 2018-04-30 ENCOUNTER — Other Ambulatory Visit: Payer: Self-pay | Admitting: Family Medicine

## 2018-04-30 DIAGNOSIS — E109 Type 1 diabetes mellitus without complications: Secondary | ICD-10-CM

## 2018-04-30 DIAGNOSIS — E039 Hypothyroidism, unspecified: Secondary | ICD-10-CM

## 2018-05-07 ENCOUNTER — Ambulatory Visit: Payer: Managed Care, Other (non HMO) | Admitting: Family Medicine

## 2018-05-15 ENCOUNTER — Other Ambulatory Visit: Payer: Self-pay | Admitting: Endocrinology

## 2018-05-15 NOTE — Telephone Encounter (Signed)
Please advise, pt has not ben seen since 07/2017 and has no upcoming appts

## 2018-05-16 MED ORDER — INSULIN ASPART 100 UNIT/ML FLEXPEN: 10.0000 [IU] | PEN_INJECTOR | Freq: Three times a day (TID) | SUBCUTANEOUS | 0 refills | Status: DC

## 2018-05-16 NOTE — Telephone Encounter (Signed)
Best number [phone]  Pt returned your call

## 2018-05-16 NOTE — Telephone Encounter (Signed)
Spoke with David Leon. Appointments scheduled for 05/21/2018 at 10:30 am with [name] with fasting labs 05/19/2018 at 9:45 am.  Medication list updated and refills on his insulin sent to his pharmacy.

## 2018-05-16 NOTE — Telephone Encounter (Signed)
Please clarify insulin type and dose with patient, there are multiple insulins on his medication list. He must have labs and a virtual visit prior to additional refills.

## 2018-05-16 NOTE — Telephone Encounter (Signed)
Left message for Ulice Brilliant to return my call.

## 2018-05-19 ENCOUNTER — Other Ambulatory Visit: Payer: Self-pay

## 2018-05-21 ENCOUNTER — Ambulatory Visit: Payer: Managed Care, Other (non HMO) | Admitting: Family Medicine

## 2018-05-26 ENCOUNTER — Other Ambulatory Visit (INDEPENDENT_AMBULATORY_CARE_PROVIDER_SITE_OTHER): Payer: PRIVATE HEALTH INSURANCE

## 2018-05-26 ENCOUNTER — Other Ambulatory Visit: Payer: Self-pay

## 2018-05-26 DIAGNOSIS — E109 Type 1 diabetes mellitus without complications: Secondary | ICD-10-CM

## 2018-05-26 DIAGNOSIS — E039 Hypothyroidism, unspecified: Secondary | ICD-10-CM | POA: Diagnosis not present

## 2018-05-26 LAB — LIPID PANEL
Cholesterol: 212 mg/dL — ABNORMAL HIGH (ref 0–200)
HDL: 41 mg/dL (ref >39.00)
LDL Cholesterol: 141 mg/dL — ABNORMAL HIGH (ref 0–99)
NonHDL: 170.68
Total CHOL/HDL Ratio: 5
Triglycerides: 150 mg/dL — ABNORMAL HIGH (ref 0.0–149.0)
VLDL: 30 mg/dL (ref 0.0–40.0)

## 2018-05-26 LAB — COMPREHENSIVE METABOLIC PANEL
ALT: 15 U/L (ref 0–53)
AST: 11 U/L (ref 0–37)
Albumin: 4.6 g/dL (ref 3.5–5.2)
Alkaline Phosphatase: 52 U/L (ref 39–117)
BUN: 14 mg/dL (ref 6–23)
CO2: 28 mEq/L (ref 19–32)
Calcium: 9.5 mg/dL (ref 8.4–10.5)
Chloride: 99 mEq/L (ref 96–112)
Creatinine, Ser: 0.76 mg/dL (ref 0.40–1.50)
GFR: 121.69 mL/min (ref >60.00)
Glucose, Bld: 169 mg/dL — ABNORMAL HIGH (ref 70–99)
Potassium: 3.5 mEq/L (ref 3.5–5.1)
Sodium: 138 mEq/L (ref 135–145)
Total Bilirubin: 0.4 mg/dL (ref 0.2–1.2)
Total Protein: 7.2 g/dL (ref 6.0–8.3)

## 2018-05-26 LAB — HEMOGLOBIN A1C: Hgb A1c MFr Bld: 11.4 % — ABNORMAL HIGH (ref 4.6–6.5)

## 2018-05-26 LAB — TSH: TSH: 14.4 u[IU]/mL — ABNORMAL HIGH (ref 0.35–4.50)

## 2018-05-28 ENCOUNTER — Encounter: Payer: Self-pay | Admitting: Family Medicine

## 2018-05-28 ENCOUNTER — Ambulatory Visit: Payer: PRIVATE HEALTH INSURANCE | Admitting: Family Medicine

## 2018-05-28 ENCOUNTER — Other Ambulatory Visit: Payer: Self-pay

## 2018-05-30 ENCOUNTER — Encounter: Payer: Self-pay | Admitting: Family Medicine

## 2018-05-30 ENCOUNTER — Ambulatory Visit (INDEPENDENT_AMBULATORY_CARE_PROVIDER_SITE_OTHER): Payer: PRIVATE HEALTH INSURANCE | Admitting: Family Medicine

## 2018-05-30 DIAGNOSIS — Z21 Asymptomatic human immunodeficiency virus [HIV] infection status: Secondary | ICD-10-CM

## 2018-05-30 DIAGNOSIS — E1065 Type 1 diabetes mellitus with hyperglycemia: Secondary | ICD-10-CM | POA: Diagnosis not present

## 2018-05-30 DIAGNOSIS — E039 Hypothyroidism, unspecified: Secondary | ICD-10-CM

## 2018-05-30 DIAGNOSIS — R41840 Attention and concentration deficit: Secondary | ICD-10-CM | POA: Diagnosis not present

## 2018-05-30 MED ORDER — LEVOTHYROXINE SODIUM 200 MCG PO TABS: ORAL_TABLET | ORAL | 1 refills | Status: DC

## 2018-05-30 MED ORDER — FREESTYLE LIBRE 14 DAY SENSOR MISC: 1.0000 [IU] | Freq: Every day | 3 refills | Status: DC | PRN

## 2018-05-30 MED ORDER — FREESTYLE LIBRE 14 DAY READER DEVI: 1.0000 [IU] | Freq: Every day | 0 refills | Status: DC | PRN

## 2018-05-30 NOTE — Progress Notes (Signed)
Virtual Visit via Video Note  I connected with David Leon on 05/30/18 at 11:20 PM EDT by a video enabled telemedicine application and verified that I am speaking with the correct person using two identifiers.  Location: Patient: In his home Provider: In my home   I discussed the limitations of evaluation and management by telemedicine and the availability of in person appointments. The patient expressed understanding and agreed to proceed.  History of Present Illness: This is a 28 year old male who requests virtual visit for follow-up of chronic medical conditions.  Diabetes mellitus type 1- is chronically under poor control.  He had his labs drawn last week and his hemoglobin A1c was 11.4 which was up from 10.9 previously.  He has not been very consistent in following up with either me or endocrinology.  He tells me today that in the last several months he lost his job and was without insurance for a while and was only taking his Basaglar insulin.  He has recently gotten new insurance which has good coverage of his insulin and he is currently taking Basaglar 50 units in the morning and NovoLog 10 units before meals.  He reports that he only checks his blood sugar about once a day and it runs "good" around 130 fasting.  His new insurance will cover a continuous glucose monitor and he is interested in this.  He has not been exercising regularly.  He says that while he was taking just his Basaglar insulin he was having some increased urination and dry mouth and weight loss but feels that the symptoms have stabilized since he is gotten back on his before meal insulin.  He is open to going back to endocrinology and would like a referral to a new provider.  Hypothyroidism- TSH last week was 14, up from 2.9 last year.  He is currently taking levothyroxine 175 mcg daily.  He reports that he does not miss doses and has been taking continuously.  He states that every year it seems he has had to go up  on his dose with endocrine.  HIV-[facility].  He has undetectable viral level.  He denies any side effects of medication.  ADHD-he reports that this was diagnosed in college and he was on medication, thinks it was Vyvanse.  He has tried to get in touch with student health there but has had difficulty due to coronavirus pandemic.  He thinks that some of his work issues might be related to difficulty concentrating and he is intereted in retesting and resuming medication if indicated.   Past Medical History:  Diagnosis Date  . Diabetes mellitus without complication (HCC)   . Diabetic ketoacidosis without coma associated with type 1 diabetes mellitus (HCC)   . Hyperlipidemia 09/11/2014  . Thrombocytopenia (HCC) 09/10/2014  . Thyroid disease    No past surgical history on file. Family History  Problem Relation Age of Onset  . Diabetes Father    Social History   Tobacco Use  . Smoking status: Former Games developermoker  . Smokeless tobacco: Never Used  Substance Use Topics  . Alcohol use: No  . Drug use: No    Observations/Objective: The patient was alert and answers questions appropriately.  He was in no acute distress.  Visible skin was unremarkable.  He was normally conversive without any shortness of breath.  His mood and affect were appropriate. There were no vitals taken for this visit. Wt Readings from Last 3 Encounters:  10/11/17  206 lb (93.4 kg)  10/06/17 195 lb 15.8 oz (88.9 kg)  08/19/17 209 lb 12.8 oz (95.2 kg)    Assessment and Plan: 1. Uncontrolled type 1 diabetes mellitus with hyperglycemia (HCC) -Patient hemoglobin A1c in the setting of patient not taking both his long-acting and short acting insulin for a month.  He reports now that his home blood sugars are improved on both Basaglar and aspart insulin regimen.  I am reluctant to change his insulin until we get more information about his home glucose readings.  He is interested in continuous  glucose monitoring and I have placed an order for this.  I will follow-up with him closely over the next several weeks and see how his blood sugars are doing and if insulin needs to be adjusted. -I expressed my concern to him that he will have long-term complications due to poor glycemic control and he agrees with endocrine referral. - Continuous Blood Gluc Sensor (FREESTYLE LIBRE 14 DAY SENSOR) MISC; 1 Units by Does not apply route daily as needed.  Dispense: 6 each; Refill: 3 - Continuous Blood Gluc Receiver (FREESTYLE LIBRE 14 DAY READER) DEVI; 1 Units by Does not apply route daily as needed.  Dispense: 1 Device; Refill: 0 - Ambulatory referral to Endocrinology  2. Acquired hypothyroidism - levothyroxine (SYNTHROID) 200 MCG tablet; TAKE 1 TABLET BY MOUTH ONCE DAILY BEFORE BREAKFAST  Dispense: 90 tablet; Refill: 1 - Ambulatory referral to Endocrinology  3. Attention and concentration deficit -He is interested in testing and I will send him resources via MyChart for him to self schedule  4. Asymptomatic HIV infection (HCC) -He plans to continue following up at [facility]  -Follow-up in 3 months   Olean Ree, FNP-BC  [facility], [facility]  05/30/2018 11:51 AM   Follow Up Instructions: Recap of visit as well as recommendations were sent to patient via my chart.   I discussed the assessment and treatment plan with the patient. The patient was provided an opportunity to ask questions and all were answered. The patient agreed with the plan and demonstrated an understanding of the instructions.   The patient was advised to call back or seek an in-person evaluation if the symptoms worsen or if the condition fails to improve as anticipated.   Emi Belfast, FNP

## 2018-06-09 ENCOUNTER — Other Ambulatory Visit: Payer: Self-pay | Admitting: Family Medicine

## 2018-06-24 ENCOUNTER — Telehealth (HOSPITAL_COMMUNITY): Payer: Self-pay | Admitting: Psychiatry

## 2018-06-25 ENCOUNTER — Encounter (HOSPITAL_COMMUNITY): Payer: Self-pay | Admitting: Psychiatry

## 2018-06-25 ENCOUNTER — Other Ambulatory Visit (HOSPITAL_COMMUNITY): Payer: Self-pay

## 2018-06-25 ENCOUNTER — Other Ambulatory Visit: Payer: Self-pay

## 2018-06-25 ENCOUNTER — Ambulatory Visit (INDEPENDENT_AMBULATORY_CARE_PROVIDER_SITE_OTHER): Payer: PRIVATE HEALTH INSURANCE | Admitting: Psychiatry

## 2018-06-25 DIAGNOSIS — R41840 Attention and concentration deficit: Secondary | ICD-10-CM

## 2018-06-25 MED ORDER — LISDEXAMFETAMINE DIMESYLATE 20 MG PO CAPS: 20.0000 mg | ORAL_CAPSULE | Freq: Every day | ORAL | 0 refills | Status: DC

## 2018-06-25 NOTE — Addendum Note (Signed)
Addended by: Kathryne Sharper T on: 06/25/2018 04:56 PM   Modules accepted: Orders

## 2018-06-25 NOTE — Progress Notes (Signed)
Virtual Visit via Video Note  I connected with David Leon on 06/25/18 at  1:00 PM EDT by a video enabled telemedicine application and verified that I am speaking with the correct person using two identifiers.   I discussed the limitations of evaluation and management by telemedicine and the availability of in person appointments. The patient expressed understanding and agreed to proceed.  History of Present Illness: Patient is 28 year old Caucasian, employed single man who is referred from his primary care physician Dr. [name] for psychiatric evaluation.  Patient told that he struggled with poor attention, concentration, multitasking for a while but lately it is affecting his life.  He was working as a Scientist, water qualityrealty regional manager but lately he had a lot of anxiety that meeting and he was told that he is distant while working.  He admitted not able to pay attention and not able to complete the task on time and he decided to step down and now working as a Investment banker, corporateproperty manager.  He like to go back to his previous job and wondering if something can help him.  Patient told he was told ADD when he was in school and recommended Ritalin but his parents were old-fashioned and did not put him on any medication.  Patient also struggled with anxiety and feels nervous around crowded area and public places.  He admitted being shy and uncomfortable around strangers.  Patient admitted that he does not talk about his personal feelings and issues to many people.  He has a close social network.  He is attached and close to his sister and sometimes talks to his brother.  Patient has HIV diagnosed years ago which was unexpected for him.  In the beginning he was very stressed and disturbed but now be overcome with diagnosis.  Currently he is not in any relationship.  Patient admitted he was feeling sad and depressed but now he try to keep himself busy at work.  Patient denies any hallucination, paranoia, suicidal  thoughts.  He denies any anger, mood swing, aggressive behavior.  He denies any history of abuse and denies any nightmares flashback.  He admitted racing thoughts poor sleep and sometimes irritability.  His biggest concern is lack of focus, attention, lack of multitasking and not able to complete the task on time.  He is getting Wellbutrin from his primary care physician 2 years ago to address his anxiety but he has not feel a significant improvement but when he tried to come off he admitted anxiety get worse.  He admitted Wellbutrin helped his cigarette smoking and he is cut down cigarette smoking a lot.  He admitted occasional drinking but denies any history of binge, intoxication, blackouts or DUI.  He denies any illegal substance use.  He reported his appetite and weight is stable.  He has diabetes, hypothyroidism.  His last hemoglobin A1c was 11.4.  [facility].  Patient denies any headaches, palpitation, panic attacks, history of seizures or any TBI.  He is open to try medication to help his ADD symptoms.  Past psychiatric history; History of possible ADD in the school and recommended to try Ritalin but never filled the prescription.  No history of psychiatric inpatient treatment, suicidal attempt, anger, mania, psychosis or PTSD.  History of anxiety all lifelong.  Prescribed Wellbutrin by primary care physician.  Psychosocial history; Patient born [place]  He has a brother and sister who had anxiety disorder.  Patient never married and he has  no children.  He was in a relationship in the past but after he was diagnosed HIV he is not engaged in any relationship.  He has limited social network.  He lives by himself.  Patient finished high school with average grades but dropped out in the college as he was unable to focus at school and work.  Alcohol and substance use history; History of drinking in his early age but denies any history of blackouts, withdrawals,  shakes, DUI or any binge.  Denies any history of illegal drug use.  Medical history; History of diabetes, hypothyroidism and see Dr. [name].  Family history of psychiatric illness; Patient reported mother, sister and brother has anxiety disorder.    Recent Results (from the past 2160 hour(s))  Lipid panel     Status: Abnormal   Collection Time: 05/26/18  8:56 AM  Result Value Ref Range   Cholesterol 212 (H) 0 - 200 mg/dL    Comment: ATP III Classification       Desirable:  < 200 mg/dL               Borderline High:  200 - 239 mg/dL          High:  > = 456 mg/dL   Triglycerides 256.3 (H) 0.0 - 149.0 mg/dL    Comment: Normal:  <893 mg/dLBorderline High:  150 - 199 mg/dL   HDL 73.42 >87.68 mg/dL   VLDL 11.5 0.0 - 72.6 mg/dL   LDL Cholesterol 203 (H) 0 - 99 mg/dL   Total CHOL/HDL Ratio 5     Comment:                Men          Women1/2 Average Risk     3.4          3.3Average Risk          5.0          4.42X Average Risk          9.6          7.13X Average Risk          15.0          11.0                       NonHDL 170.68     Comment: NOTE:  Non-HDL goal should be 30 mg/dL higher than patient's LDL goal (i.e. LDL goal of < 70 mg/dL, would have non-HDL goal of < 100 mg/dL)  Comprehensive metabolic panel     Status: Abnormal   Collection Time: 05/26/18  8:56 AM  Result Value Ref Range   Sodium 138 135 - 145 mEq/L   Potassium 3.5 3.5 - 5.1 mEq/L   Chloride 99 96 - 112 mEq/L   CO2 28 19 - 32 mEq/L   Glucose, Bld 169 (H) 70 - 99 mg/dL   BUN 14 6 - 23 mg/dL   Creatinine, Ser 5.59 0.40 - 1.50 mg/dL   Total Bilirubin 0.4 0.2 - 1.2 mg/dL   Alkaline Phosphatase 52 39 - 117 U/L   AST 11 0 - 37 U/L   ALT 15 0 - 53 U/L   Total Protein 7.2 6.0 - 8.3 g/dL   Albumin 4.6 3.5 - 5.2 g/dL   Calcium 9.5 8.4 - 74.1 mg/dL   GFR 638.45 >36.46 mL/min  TSH     Status: Abnormal   Collection Time: 05/26/18  8:56  AM  Result Value Ref Range   TSH 14.40 (H) 0.35 - 4.50 uIU/mL  Hemoglobin A1c      Status: Abnormal   Collection Time: 05/26/18  8:56 AM  Result Value Ref Range   Hgb A1c MFr Bld 11.4 (H) 4.6 - 6.5 %    Comment: Glycemic Control Guidelines for People with Diabetes:Non Diabetic:  <6%Goal of Therapy: <7%Additional Action Suggested:  >8%       Psychiatric Specialty Exam: Physical Exam  ROS  There were no vitals taken for this visit.There is no height or weight on file to calculate BMI.  General Appearance: Casual and shy  Eye Contact:  Good  Speech:  Clear and Coherent  Volume:  Normal  Mood:  Anxious  Affect:  Appropriate  Thought Process:  Goal Directed  Orientation:  Full (Time, Place, and Person)  Thought Content:  Logical  Suicidal Thoughts:  No  Homicidal Thoughts:  No  Memory:  Immediate;   Good Recent;   Good Remote;   Good  Judgement:  Good  Insight:  Good  Psychomotor Activity:  Normal  Concentration:  Concentration: Fair and Attention Span: Fair  Recall:  Good  Fund of Knowledge:  Good  Language:  Good  Akathisia:  No  Handed:  Right  AIMS (if indicated):     Assets:  Communication Skills Desire for Improvement Housing Resilience Social Support Talents/Skills Transportation  ADL's:  Intact  Cognition:  WNL  Sleep:   fair      Assessment and Plan: Wilbern is a 28 year old Caucasian man with a history of anxiety disorder present with a symptoms of poor attention, focus, difficulty multitasking and unable to complete his assignment on time.  We discussed about possible ADD, inattentive type.  He like to try medication and I explained most of the ADD medicine can cause worsening of anxiety and insomnia.  Patient like to try and promised if something did not work out or having any side effects and he will call us back.  I also recommend that he should do a psychological testing to establish the diagnosis.  He will contact his primary care physician to get referral for psychological testing and we will also help him to get a referral however due  to COVID-19 most of the places are not taking new patient.  I will forward my note to his primary care physician.  He will continue Wellbutrin XL 300 mg daily prescribed by PCP.  We will start low-dose Vyvanse 20 mg to help his focus and ADD symptoms.  We discussed stimulant abuse, tolerance, withdrawal and dependency.  Recommend not to drink alcohol with the medication due to potential interaction.  Recommend if he had any side effects including insomnia, palpitation, worsening of anxiety than he should call us immediately.  Follow-up in 3 weeks.  Discussed safety concern that anytime having active suicidal thoughts or homicidal thoughts and he need to call 911 or go to local emergency room.  Follow Up Instructions:    I discussed the assessment and treatment plan with the patient. The patient was provided an opportunity to ask questions and all were answered. The patient agreed with the plan and demonstrated an understanding of the instructions.   The patient was advised to call back or seek an in-person evaluation if the symptoms worsen or if the condition fails to improve as anticipated.  I provided 55 minutes of non-face-to-face time during this encounter.   Cleotis Nipper, MD

## 2018-06-30 ENCOUNTER — Telehealth: Payer: Self-pay

## 2018-06-30 NOTE — Telephone Encounter (Signed)
Pt left v/m; pt spoke with ins earlier today and pt said needs prior auth form faxed to [phone] fpr freestyle libre. Pt request cb when completed.

## 2018-07-01 NOTE — Telephone Encounter (Signed)
PA completed on CoverMyMeds.  Sent for review.  Awaiting decision.  Can take up to 72 hours for a decision.

## 2018-07-02 NOTE — Telephone Encounter (Signed)
PA was denied through prescription coverage.  PA states it may be approved through his medical insurance.

## 2018-07-02 NOTE — Telephone Encounter (Signed)
Placing fax from insurance on [place] desk. Patient advised that this is not been covered. I tried to call insurance and see of medical benefit plan would cover but after speaking with 2 people and waiting for a while did not get any further information. Patient said Freestyle Elenor Legato was expensive and can not pay that. Wondering if there are any other options. It sounds like insurance does not cover any medical supplies and devices under pharmacy benefit coverage.

## 2018-07-04 ENCOUNTER — Encounter: Payer: Self-pay | Admitting: Family Medicine

## 2018-07-04 NOTE — Telephone Encounter (Signed)
Message sent to patient via Mychart regarding denial.

## 2018-07-15 ENCOUNTER — Telehealth (HOSPITAL_COMMUNITY): Payer: Self-pay

## 2018-07-15 DIAGNOSIS — R41840 Attention and concentration deficit: Secondary | ICD-10-CM

## 2018-07-15 MED ORDER — AMPHETAMINE-DEXTROAMPHET ER 10 MG PO CP24: 10.0000 mg | ORAL_CAPSULE | Freq: Every day | ORAL | 0 refills | Status: DC

## 2018-07-15 NOTE — Telephone Encounter (Signed)
Patient called regarding his Vyvanse 20mg . He stated that his insurance does not cover the medication but was told by his insurance that Adderall extended release would be covered. Patient stated he would like to try that. Please review and advise. Thank you.

## 2018-07-15 NOTE — Telephone Encounter (Signed)
Send to [facility] at [place]. Discontinue Vyvanse.

## 2018-07-16 ENCOUNTER — Encounter (HOSPITAL_COMMUNITY): Payer: Self-pay | Admitting: Psychiatry

## 2018-07-16 ENCOUNTER — Ambulatory Visit (INDEPENDENT_AMBULATORY_CARE_PROVIDER_SITE_OTHER): Payer: PRIVATE HEALTH INSURANCE | Admitting: Psychiatry

## 2018-07-16 ENCOUNTER — Other Ambulatory Visit: Payer: Self-pay

## 2018-07-16 DIAGNOSIS — R41840 Attention and concentration deficit: Secondary | ICD-10-CM

## 2018-07-16 NOTE — Progress Notes (Signed)
Virtual Visit via Telephone Note  I connected with David Leon on 07/16/18 at  9:00 AM EDT by telephone and verified that I am speaking with the correct person using two identifiers.   I discussed the limitations, risks, security and privacy concerns of performing an evaluation and management service by telephone and the availability of in person appointments. I also discussed with the patient that there may be a patient responsible charge related to this service. The patient expressed understanding and agreed to proceed.   History of Present Illness: Patient was evaluated by phone session.  He is a 28 year old man who was initially evaluated 4 weeks ago.  He had struggled with poor attention, concentration and multitasking.  He is working as a English as a second language teacher in [employer] and he struggle to finish his work on time.  He had to step down his position due to his lack of functionality and unable to finish his task on time.  He like to go back to his previous job and wanted to get some help to help his attention and focus.  He had diagnosed ADD when he was in the school and recommended Ritalin but his parents never agreed.  He also had anxiety and feels nervous around crowded area and public places.  Currently he is not in any relationship.  He has HIV.  He is on Wellbutrin prescribed by primary care physician 2 years ago.  We have started him on Vyvanse however his insurance does not cover and he is unable to afford the medication.  Yesterday he called and requested to switch his medication to generic.  We started him Adderall XR however he has not picked up the medicine.  Patient told he has been traveling to [facility] every day and is been very busy and has not had a time to pick up the medicine.  He reported his symptoms remain the same.  However he denies any hallucination, paranoia or any suicidal thoughts.  His energy level is fair.  His appetite and weight is stable.  Patient has  diabetes, HIV and hypothyroidism.  His last hemoglobin A1c was 11.4.  He reported no side effects from Wellbutrin.  Past psychiatric history; History of possible ADD in the school and recommended to try Ritalin but never filled the prescription.  No history of psychiatric inpatient treatment, suicidal attempt, anger, mania, psychosis or PTSD.  History of anxiety all lifelong.  Prescribed Wellbutrin by primary care physician.   Psychiatric Specialty Exam: Physical Exam  ROS  There were no vitals taken for this visit.There is no height or weight on file to calculate BMI.  General Appearance: NA  Eye Contact:  NA  Speech:  Clear and Coherent  Volume:  Normal  Mood:  Anxious  Affect:  NA  Thought Process:  Goal Directed  Orientation:  Full (Time, Place, and Person)  Thought Content:  Logical  Suicidal Thoughts:  No  Homicidal Thoughts:  No  Memory:  Immediate;   Good Recent;   Good Remote;   Good  Judgement:  Good  Insight:  Fair  Psychomotor Activity:  Normal  Concentration:  Concentration: Fair and Attention Span: Fair  Recall:  Good  Fund of Knowledge:  Good  Language:  Good  Akathisia:  No  Handed:  Right  AIMS (if indicated):     Assets:  Communication Skills Desire for Improvement Housing Resilience Talents/Skills Transportation  ADL's:  Intact  Cognition:  WNL  Sleep:   fair  Assessment and Plan: ADD, inattentive type.  Generalized anxiety.  Patient has not started Adderall XR yet.  He is taking Wellbutrin XL 300 mg prescribed by PCP to help his anxiety.  We have discussed if stimulant did not help him then we may need psychological testing.  I discussed stimulant abuse, tolerance and withdrawal. He understand side effects of medication.  Discussed not to take with alcohol due to interaction.  We will schedule appointment in 4 to 5 weeks.  I recommend to call us if he has any question or any concern.  Follow Up Instructions:    I discussed the assessment  and treatment plan with the patient. The patient was provided an opportunity to ask questions and all were answered. The patient agreed with the plan and demonstrated an understanding of the instructions.   The patient was advised to call back or seek an in-person evaluation if the symptoms worsen or if the condition fails to improve as anticipated.  I provided 10 minutes of non-face-to-face time during this encounter.   Cleotis NipperSyed T Kru Allman, MD

## 2018-08-05 ENCOUNTER — Other Ambulatory Visit: Payer: Self-pay | Admitting: Family Medicine

## 2018-08-15 ENCOUNTER — Telehealth (HOSPITAL_COMMUNITY): Payer: Self-pay

## 2018-08-15 DIAGNOSIS — R41840 Attention and concentration deficit: Secondary | ICD-10-CM

## 2018-08-15 NOTE — Telephone Encounter (Signed)
Patient called for a refill on Adderall, he has a follow up on 8/4, but will be out tomorrow.

## 2018-08-18 MED ORDER — AMPHETAMINE-DEXTROAMPHET ER 10 MG PO CP24: 10.0000 mg | ORAL_CAPSULE | Freq: Every day | ORAL | 0 refills | Status: DC

## 2018-08-18 NOTE — Telephone Encounter (Signed)
Send to [facility], [address]

## 2018-08-26 ENCOUNTER — Other Ambulatory Visit: Payer: Self-pay

## 2018-08-26 ENCOUNTER — Ambulatory Visit (INDEPENDENT_AMBULATORY_CARE_PROVIDER_SITE_OTHER): Payer: PRIVATE HEALTH INSURANCE | Admitting: Psychiatry

## 2018-08-26 ENCOUNTER — Encounter (HOSPITAL_COMMUNITY): Payer: Self-pay | Admitting: Psychiatry

## 2018-08-26 DIAGNOSIS — F411 Generalized anxiety disorder: Secondary | ICD-10-CM

## 2018-08-26 DIAGNOSIS — F900 Attention-deficit hyperactivity disorder, predominantly inattentive type: Secondary | ICD-10-CM

## 2018-08-26 DIAGNOSIS — R41840 Attention and concentration deficit: Secondary | ICD-10-CM

## 2018-08-26 MED ORDER — AMPHETAMINE-DEXTROAMPHET ER 15 MG PO CP24: 15.0000 mg | ORAL_CAPSULE | Freq: Every day | ORAL | 0 refills | Status: DC

## 2018-08-26 NOTE — Progress Notes (Signed)
Virtual Visit via Telephone Note  I connected with David Leon on 08/26/18 at  9:20 AM EDT by telephone and verified that I am speaking with the correct person using two identifiers.   I discussed the limitations, risks, security and privacy concerns of performing an evaluation and management service by telephone and the availability of in person appointments. I also discussed with the patient that there may be a patient responsible charge related to this service. The patient expressed understanding and agreed to proceed.   History of Present Illness: Patient was evaluated by phone session.  On his last visit we started him on Adderall 10 mg.  He is doing much better his attention and concentration is improved.  He is able to do multitasking but he feels sometimes it wears off at 2 PM.  He also mentioned that his attention concentration is better.  He reported no side effects from Adderall.  He is [place].  He is more confidence at work since Adderall helping his attention and concentration.  He is also taking Wellbutrin from his other physician.  He reported no tremors, shakes, palpitation or any nervousness.  His appetite is okay.  He reported his weight is stable.  He had appointment upcoming with endocrinologist in few weeks.  Patient has diabetes, HIV and hypothyroidism.   Past psychiatric history; History of ADD in the school and recommended to try Ritalin but never filled the prescription. No history of psychiatric inpatient treatment, suicidal attempt, anger, mania, psychosis or PTSD. History of anxiety all lifelong. Prescribed Wellbutrin by primary care physician.  We try Vyvanse but he could not afford.   Psychiatric Specialty Exam: Physical Exam  ROS  There were no vitals taken for this visit.There is no height or weight on file to calculate BMI.  General Appearance: NA  Eye Contact:  NA  Speech:  Clear and Coherent and Normal Rate   Volume:  Normal  Mood:  Euthymic  Affect:  NA  Thought Process:  Goal Directed  Orientation:  Full (Time, Place, and Person)  Thought Content:  Logical  Suicidal Thoughts:  No  Homicidal Thoughts:  No  Memory:  Immediate;   Good Recent;   Good Remote;   Good  Judgement:  Good  Insight:  NA  Psychomotor Activity:  NA  Concentration:  Concentration: Good and Attention Span: Good  Recall:  Good  Fund of Knowledge:  Good  Language:  Good  Akathisia:  No  Handed:  Right  AIMS (if indicated):     Assets:  Communication Skills Desire for Improvement Housing Physical Health Resilience Social Support  ADL's:  Intact  Cognition:  WNL  Sleep:        Assessment and Plan: ADHD, inattentive type.  Generalized anxiety disorder.  Patient doing better on Adderall however he does feel that it started to wear off at 2 PM in the afternoon.  He is wondering if dose can further increase.  He is tolerating his medication and reported no tremors, shakes, anxiety, palpitation or insomnia.  We will try Adderall 15 mg to address his residual symptoms of attention and focus.  He is getting Wellbutrin XL 300 mg from his PCP.  He has upcoming appointment to see his endocrinologist and recommend to have his blood work faxed to us.  Discussed medication side effects and benefits specially stimulant abuse, dependence, tolerance and withdrawal.  Recommended to call us back if is any question or any concern.  Follow-up in 6 weeks.  Follow Up Instructions:    I discussed the assessment and treatment plan with the patient. The patient was provided an opportunity to ask questions and all were answered. The patient agreed with the plan and demonstrated an understanding of the instructions.   The patient was advised to call back or seek an in-person evaluation if the symptoms worsen or if the condition fails to improve as anticipated.  I provided 20 minutes of non-face-to-face time during this  encounter.   Kathlee Nations, MD

## 2018-09-16 ENCOUNTER — Other Ambulatory Visit: Payer: Self-pay | Admitting: Internal Medicine

## 2018-09-19 ENCOUNTER — Other Ambulatory Visit: Payer: Self-pay | Admitting: Internal Medicine

## 2018-09-19 ENCOUNTER — Other Ambulatory Visit: Payer: Self-pay | Admitting: Family Medicine

## 2018-09-19 MED ORDER — BASAGLAR KWIKPEN 100 UNIT/ML ~~LOC~~ SOPN: 55.0000 [IU] | PEN_INJECTOR | Freq: Every day | SUBCUTANEOUS | 0 refills | Status: DC

## 2018-09-19 NOTE — Telephone Encounter (Signed)
David Leon, looks like a refilled this rx through Dr. Verlene Mayer name during one of those days when I was helping you. He needs a refill. He is one of your patients. Thank you.

## 2018-10-02 DIAGNOSIS — E1069 Type 1 diabetes mellitus with other specified complication: Secondary | ICD-10-CM | POA: Insufficient documentation

## 2018-10-06 ENCOUNTER — Telehealth (HOSPITAL_COMMUNITY): Payer: Self-pay

## 2018-10-06 DIAGNOSIS — R41840 Attention and concentration deficit: Secondary | ICD-10-CM

## 2018-10-06 NOTE — Telephone Encounter (Signed)
Patient is calling for a refill on his Adderall, [facility] on [address]

## 2018-10-07 MED ORDER — AMPHETAMINE-DEXTROAMPHET ER 15 MG PO CP24: 15.0000 mg | ORAL_CAPSULE | Freq: Every day | ORAL | 0 refills | Status: DC

## 2018-10-07 NOTE — Telephone Encounter (Signed)
done

## 2018-10-26 ENCOUNTER — Other Ambulatory Visit: Payer: Self-pay | Admitting: Family Medicine

## 2018-11-03 ENCOUNTER — Encounter: Payer: Self-pay | Admitting: Dietician

## 2018-11-03 ENCOUNTER — Encounter: Payer: PRIVATE HEALTH INSURANCE | Attending: "Endocrinology | Admitting: Dietician

## 2018-11-03 ENCOUNTER — Other Ambulatory Visit: Payer: Self-pay

## 2018-11-03 VITALS — BP 130/88 | Ht 71.0 in | Wt 244.5 lb

## 2018-11-03 DIAGNOSIS — E109 Type 1 diabetes mellitus without complications: Secondary | ICD-10-CM

## 2018-11-03 DIAGNOSIS — E1065 Type 1 diabetes mellitus with hyperglycemia: Secondary | ICD-10-CM | POA: Insufficient documentation

## 2018-11-03 NOTE — Patient Instructions (Addendum)
   Check BG with meter before meals if Libre reading is changing up or down  Exercise:  Continue walking 30-60 min 3-4x/wk  Eat 3 balanced meals/day-include 3-4 carbohydrate servings/meal + protein  Eat 1-2 snacks/day-include 1 carbohydrate serving/snack + protein  Space meals 4-5 hours apart  Avoid sugar sweetened drinks (soda, tea, coffee, sports drinks, juices)  Limit intake of sweets, snack foods and fried foods  Drink lots of water  Get urine ketone strips-check urine ketones if BG>300  Complete 3 Day Food Record and bring to next appt-estimate carb grams  Make eye and dental appointments  Carry fast acting glucose and a snack at all times  Rotate injection sites  Carry medical alert ID at all times  Return for appointment/classes on:  Wed., 11-19-18 at 1:15p

## 2018-11-03 NOTE — Progress Notes (Signed)
Diabetes Self-Management Education  Visit Type: First/Initial  Appt. Start Time: 1545 Appt. End Time: 1700  11/03/2018  [name], identified by name and date of birth, is a 28 y.o. male with a diagnosis of Diabetes: Type 1.   ASSESSMENT  Blood pressure 130/88, height 5\' 11"  (1.803 m), weight 244 lb 8 oz (110.9 kg). Body mass index is 34.1 kg/m.  Diabetes Self-Management Education - 11/03/18 1708      Visit Information   Visit Type  First/Initial      Initial Visit   Diabetes Type  Type 1      Health Coping   How would you rate your overall health?  Poor      Psychosocial Assessment   Patient Belief/Attitude about Diabetes  Motivated to manage diabetes    Self-care barriers  None    Self-management support  Doctor's office;Family    Other persons present  Patient    Patient Concerns  Healthy Lifestyle;Glycemic Control;Weight Control    Special Needs  None    Preferred Learning Style  Hands on;Visual;Auditory;No preference indicated    Learning Readiness  Ready    What is the last grade level you completed in school?  masters      Pre-Education Assessment   Patient understands the diabetes disease and treatment process.  Needs Review    Patient understands incorporating nutritional management into lifestyle.  Needs Review    Patient undertands incorporating physical activity into lifestyle.  Needs Review    Patient understands using medications safely.  Needs Review    Patient understands monitoring blood glucose, interpreting and using results  Needs Review    Patient understands prevention, detection, and treatment of acute complications.  Needs Review    Patient understands prevention, detection, and treatment of chronic complications.  Needs Review    Patient understands how to develop strategies to address psychosocial issues.  Needs Review    Patient understands how to develop strategies to promote health/change behavior.  Needs Review      Complications    Last HgB A1C per patient/outside source  11.4 %   05-26-18   How often do you check your blood sugar?  --   uses Libre glucose sensor-results fluctuating-has highs (200's-300's) and lows   Have you had a dilated eye exam in the past 12 months?  Yes   1 year ago   Have you had a dental exam in the past 12 months?  Yes   6 months ago   Are you checking your feet?  No      Dietary Intake   Breakfast  eats breakfast at 10a=protein shake and banana    Snack (morning)  none    Lunch  eats lunch 12-2p=meat with vegetables, pasta; eats fried foods 4-5x/wk    Snack (afternoon)  3-4p=eats almonds or cashews, chips    Dinner  eats supper 6-8p=grilled chicken or steak, vegtables, pasta; eats sweets 2-3x/wk    Snack (evening)  eats almonds, cashews, chips at 9p    Beverage(s)  drinks water 2-3x/day and sugar free drinks 2-3x/day; milk 2x/day; occasionally drinks fruit juice for low BG's      Exercise   Exercise Type  Light (walking / raking leaves)    How many days per week to you exercise?  2.5    How many minutes per day do you exercise?  60    Total minutes per week of exercise  150      Patient Education  Previous Diabetes Education  No    Disease state   Definition of diabetes, type 1 and 2, and the diagnosis of diabetes    Nutrition management   Role of diet in the treatment of diabetes and the relationship between the three main macronutrients and blood glucose level;Food label reading, portion sizes and measuring food.;Carbohydrate counting    Physical activity and exercise   Role of exercise on diabetes management, blood pressure control and cardiac health.    Medications  Taught/reviewed insulin injection, site rotation, insulin storage and needle disposal.;Reviewed patients medication for diabetes, action, purpose, timing of dose and side effects.    Monitoring  Ketone testing, when, how.;Yearly dilated eye exam    Acute complications  Discussed and identified patients' treatment of  hyperglycemia.;Taught treatment of hypoglycemia - the 15 rule.    Chronic complications  Relationship between chronic complications and blood glucose control;Dental care;Retinopathy and reason for yearly dilated eye exams    Personal strategies to promote health  Lifestyle issues that need to be addressed for better diabetes care;Helped patient develop diabetes management plan for (enter comment)      Outcomes   Expected Outcomes  Demonstrated interest in learning. Expect positive outcomes       Individualized Plan for Diabetes Self-Management Training:   Learning Objective:  Patient will have a greater understanding of diabetes self-management. Patient education plan is to attend individual and/or group sessions per assessed needs and concerns.   Plan:   Patient Instructions    Check BG with meter before meals if Libre reading is changing up or down  Exercise:  Continue walking 30-60 min 3-4x/wk  Eat 3 balanced meals/day-include 3-4 carbohydrate servings/meal + protein  Eat 1-2 snacks/day-include 1 carbohydrate serving/snack + protein  Space meals 4-5 hours apart  Avoid sugar sweetened drinks (soda, tea, coffee, sports drinks, juices)  Limit intake of sweets, snack foods and fried foods  Drink lots of water  Get urine ketone strips-check urine ketones if BG>300  Complete 3 Day Food Record and bring to next appt-estimate carb grams  Make eye and dental appointments  Carry fast acting glucose and a snack at all times  Rotate injection sites  Carry medical alert ID at all times  Return for appointment/classes on:  Wed., 11-19-18 at 1:15p   Expected Outcomes:  Demonstrated interest in learning. Expect positive outcomes  Education material provided: General meal planning guidelines, Food Group handout, medical alert ID card and coupon, ADA Diabetes booklet, high and low BG handouts  If problems or questions, patient to contact team via:  [phone]  Future DSME  appointment:  11-19-18

## 2018-11-09 ENCOUNTER — Other Ambulatory Visit: Payer: Self-pay | Admitting: Family Medicine

## 2018-11-11 NOTE — Telephone Encounter (Signed)
Appointment made for follow up. Patient states his insulin RX went to his endocrinologist who is following him for that. Declining refill request that was sent to [place].

## 2018-11-12 ENCOUNTER — Telehealth (HOSPITAL_COMMUNITY): Payer: Self-pay

## 2018-11-12 DIAGNOSIS — R41840 Attention and concentration deficit: Secondary | ICD-10-CM

## 2018-11-12 NOTE — Telephone Encounter (Signed)
Patient is calling for a refill on Adderall. 

## 2018-11-13 MED ORDER — AMPHETAMINE-DEXTROAMPHET ER 15 MG PO CP24: 15.0000 mg | ORAL_CAPSULE | Freq: Every day | ORAL | 0 refills | Status: DC

## 2018-11-13 NOTE — Telephone Encounter (Signed)
Done

## 2018-11-19 ENCOUNTER — Other Ambulatory Visit: Payer: Self-pay

## 2018-11-19 ENCOUNTER — Telehealth: Payer: Self-pay | Admitting: Dietician

## 2018-11-19 ENCOUNTER — Encounter: Payer: Self-pay | Admitting: Family Medicine

## 2018-11-19 ENCOUNTER — Ambulatory Visit (INDEPENDENT_AMBULATORY_CARE_PROVIDER_SITE_OTHER): Payer: PRIVATE HEALTH INSURANCE | Admitting: Family Medicine

## 2018-11-19 ENCOUNTER — Ambulatory Visit: Payer: Self-pay | Admitting: Dietician

## 2018-11-19 VITALS — BP 138/90 | HR 86 | Temp 98.1°F | Ht 72.0 in | Wt 242.8 lb

## 2018-11-19 DIAGNOSIS — E039 Hypothyroidism, unspecified: Secondary | ICD-10-CM

## 2018-11-19 DIAGNOSIS — Z23 Encounter for immunization: Secondary | ICD-10-CM | POA: Diagnosis not present

## 2018-11-19 DIAGNOSIS — E1065 Type 1 diabetes mellitus with hyperglycemia: Secondary | ICD-10-CM | POA: Diagnosis not present

## 2018-11-19 DIAGNOSIS — G479 Sleep disorder, unspecified: Secondary | ICD-10-CM | POA: Diagnosis not present

## 2018-11-19 NOTE — Progress Notes (Signed)
Subjective:    Patient ID: David Leon, male    DOB: [birth year], 29 y.o.   MRN: [id number]  HPI This is a 28 yo male who presents today for follow up of chronic medical conditions. Is currently working in [place] which he enjoys. Less travel. Feels like he will be able to be more structured in his eating and exercising routines.   DM type 1- saw new endocrinologist, adjustments to insulin. Given sample of [place] monitor and it likes it. BS improved. Has been keeping net carbs to about 10 a meal. Doing [place] to help with meal prep. Some walking. Fewer low episodes. Has upcoming follow up to check hgba1c and TSH per note.   Sleep- difficulty going to sleep. Takes adderall first thing in am. Feels like it wears off by mid afternoon. No caffeine after mid morning. Trazadone made him very groggy the next day. Mind races at bedtime. Anxiety has recently improved. Feels like his sleep is worse. He has upcoming appointment with psych.   ROS- no chest pain, no cough, no abdominal pain/diarrhea/constipation.     Past Medical History:  Diagnosis Date  . Diabetes mellitus without complication ([place])   . Diabetic ketoacidosis without coma associated with type 1 diabetes mellitus ([place])   . HIV infection ([place])   . Hyperlipidemia 09/11/2014  . Thrombocytopenia ([place]) 09/10/2014  . Thyroid disease    No past surgical history on file. Family History  Problem Relation Age of Onset  . Diabetes Father   . Anxiety disorder Mother   . Anxiety disorder Sister   . Anxiety disorder Brother    Social History   Tobacco Use  . Smoking status: Former Research scientist (life sciences)  . Smokeless tobacco: Never Used  Substance Use Topics  . Alcohol use: Yes    Alcohol/week: 6.0 standard drinks    Types: 6 Shots of liquor per week  . Drug use: No      Review of Systems Per HPI    Objective:   Physical Exam Vitals signs reviewed.  Constitutional:      General: He is not in acute distress.  Appearance: Normal appearance. He is obese. He is not ill-appearing, toxic-appearing or diaphoretic.  HENT:     Head: Normocephalic and atraumatic.  Eyes:     Conjunctiva/sclera: Conjunctivae normal.  Cardiovascular:     Rate and Rhythm: Normal rate.  Pulmonary:     Effort: Pulmonary effort is normal.  Neurological:     Mental Status: He is alert and oriented to person, place, and time.  Psychiatric:        Mood and Affect: Mood normal.        Behavior: Behavior normal.        Thought Content: Thought content normal.        Judgment: Judgment normal.       BP 138/90   Pulse 86   Temp 98.1 F (36.7 C)   Ht 6' (1.829 m)   Wt 242 lb 12.8 oz (110.1 kg)   SpO2 99%   BMI 32.93 kg/m  Wt Readings from Last 3 Encounters:  11/19/18 242 lb 12.8 oz (110.1 kg)  11/03/18 244 lb 8 oz (110.9 kg)  10/11/17 206 lb (93.4 kg)       Assessment & Plan:  1. Uncontrolled type 1 diabetes mellitus with hyperglycemia (HCC) - improved control with new insulin regimen, carb counting, nutrition input.  - encouraged his efforts, discussed changes he can make to meals -  follow up as scheduled next month with endocrine  2. Acquired hypothyroidism - per endocrine, will check TSH at next visit  3. Sleep disturbance - discussed sleep hygiene, long acting melatonin, meditation - follow up with psych as planned  4. Need for immunization against influenza - Flu Vaccine QUAD 36+ mos IM  - follow up in 6 months for CPE Olean Ree, FNP-BC  [facility], [facility]  11/21/2018 7:34 AM

## 2018-11-19 NOTE — Telephone Encounter (Signed)
Patient cancelled his appointment for today via voicemail; call was returned with a message for him to call back to reschedule.

## 2018-11-19 NOTE — Patient Instructions (Addendum)
Look at apps- Calm, Headspace- free introductory available  Ask Dr. Adele Schilder about sleep  Follow up in 6- 8 month for your CPE

## 2018-11-26 ENCOUNTER — Other Ambulatory Visit: Payer: Self-pay

## 2018-11-26 ENCOUNTER — Encounter (HOSPITAL_COMMUNITY): Payer: Self-pay | Admitting: Psychiatry

## 2018-11-26 ENCOUNTER — Ambulatory Visit (INDEPENDENT_AMBULATORY_CARE_PROVIDER_SITE_OTHER): Payer: PRIVATE HEALTH INSURANCE | Admitting: Psychiatry

## 2018-11-26 DIAGNOSIS — R41840 Attention and concentration deficit: Secondary | ICD-10-CM | POA: Diagnosis not present

## 2018-11-26 DIAGNOSIS — F411 Generalized anxiety disorder: Secondary | ICD-10-CM

## 2018-11-26 MED ORDER — AMPHETAMINE-DEXTROAMPHET ER 15 MG PO CP24: 15.0000 mg | ORAL_CAPSULE | Freq: Every day | ORAL | 0 refills | Status: DC

## 2018-11-26 MED ORDER — BUPROPION HCL ER (XL) 150 MG PO TB24: 150.0000 mg | ORAL_TABLET | Freq: Every day | ORAL | 1 refills | Status: DC

## 2018-11-26 NOTE — Progress Notes (Signed)
Virtual Visit via Telephone Note  I connected with David Leon on 11/26/18 at  9:20 AM EST by telephone and verified that I am speaking with the correct person using two identifiers.   I discussed the limitations, risks, security and privacy concerns of performing an evaluation and management service by telephone and the availability of in person appointments. I also discussed with the patient that there may be a patient responsible charge related to this service. The patient expressed understanding and agreed to proceed.   History of Present Illness: Patient was evaluated by phone session.  On the last visit we increase the Adderall to 50 mg as he was complaining about medicine wearing off in the afternoon.  Now he feels that his attention concentration is much better but he is complaining of poor sleep and racing thoughts.  He admitted feeling overwhelmed especially at night.  He is getting Wellbutrin XL 300 mg from his PCP.  Recently he change his career and he does not have to travel to [facility].  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  He feels it helps his anxiety.  Denies any tremors, shakes or any EPS.  He denies any heart palpitation.  He feels the Adderall helping and he is able to do multitasking and his thoughts are organized.  Energy level is good.  His appetite is okay.  He reported that he used to get trazodone from his primary care physician to help with insomnia but lately he feels that he need trazodone every night because of persistent insomnia.  His physician recommended to discuss with psychiatrist.  Patient has diabetes, HIV and hypothyroidism.   Past psychiatric history; H/O ADD in the school and recommended to try Ritalin but never took. No h/o inpatient treatment, suicidal attempt, anger, mania, psychosis or PTSD. H/O anxiety all lifelong. Given Wellbutrin by PCP. We tried Vyvanse but he could not afford.    Psychiatric Specialty Exam: Physical  Exam  ROS  There were no vitals taken for this visit.There is no height or weight on file to calculate BMI.  General Appearance: NA  Eye Contact:  NA  Speech:  Clear and Coherent and Normal Rate  Volume:  Normal  Mood:  Euthymic  Affect:  NA  Thought Process:  Goal Directed  Orientation:  Full (Time, Place, and Person)  Thought Content:  WDL and Logical  Suicidal Thoughts:  No  Homicidal Thoughts:  No  Memory:  Immediate;   Good Recent;   Good Remote;   Good  Judgement:  Good  Insight:  Good  Psychomotor Activity:  NA  Concentration:  Concentration: Good and Attention Span: Good  Recall:  Good  Fund of Knowledge:  Good  Language:  Good  Akathisia:  No  Handed:  Right  AIMS (if indicated):     Assets:  Communication Skills Desire for Improvement Housing Resilience Talents/Skills Transportation  ADL's:  Intact  Cognition:  WNL  Sleep:   poor      Assessment and Plan: Attention deficit disorder, inattentive type.  Generalized anxiety disorder.  I had a long discussion with the patient about his medication which may cause insomnia.  We recommended to try Vyvanse in the past but he could not afford.  Now he is changing his carrier and his insurance.  But he feels that the Adderall helps him a lot and he is able to finish his task on time.  I recommended rather adding another medication trazodone at night  we can try reducing the Wellbutrin to a lower dose to see if he can sleep better.  He agreed with the plan.  We will reduce the Wellbutrin 250 mg in the morning and continue at present Adderall XR 15 mg in the morning.  He would also look into his insurance if he can afford Vyvanse in December.  I recommended to call us back if he feels worsening of the symptom.  Follow-up in 6 weeks.  Follow Up Instructions:    I discussed the assessment and treatment plan with the patient. The patient was provided an opportunity to ask questions and all were answered. The patient agreed  with the plan and demonstrated an understanding of the instructions.   The patient was advised to call back or seek an in-person evaluation if the symptoms worsen or if the condition fails to improve as anticipated.  I provided 20 minutes of non-face-to-face time during this encounter.   Cleotis Nipper, MD

## 2018-12-03 ENCOUNTER — Other Ambulatory Visit: Payer: Self-pay | Admitting: Family Medicine

## 2018-12-03 DIAGNOSIS — E039 Hypothyroidism, unspecified: Secondary | ICD-10-CM

## 2018-12-04 NOTE — Telephone Encounter (Signed)
Does this refill need to go to endocrinologist?

## 2018-12-05 NOTE — Telephone Encounter (Signed)
Please check with patient and see when he sees endocrinology next. They were going to check his labs. If he needs enough to get him through to his appointment, that is fine. I just don't want him to get 90 days worth if there is a chance his dose will be changed.

## 2018-12-05 NOTE — Telephone Encounter (Signed)
Pt is due to see Endo next month and states that he does not have enough to last him to that appt.  I advised that I would send enough to get to December and then once he sees Endo and gets labs we will refill further.   30 day sent to [facility]  Nothing further needed.

## 2018-12-15 ENCOUNTER — Other Ambulatory Visit (HOSPITAL_COMMUNITY): Payer: Self-pay | Admitting: *Deleted

## 2018-12-15 ENCOUNTER — Telehealth (HOSPITAL_COMMUNITY): Payer: Self-pay | Admitting: *Deleted

## 2018-12-15 DIAGNOSIS — R41840 Attention and concentration deficit: Secondary | ICD-10-CM

## 2018-12-15 NOTE — Telephone Encounter (Signed)
Pt called requesting refill on his adderall 15mg . Pt has an appointment in December.

## 2018-12-26 ENCOUNTER — Encounter: Payer: Self-pay | Admitting: Dietician

## 2018-12-26 NOTE — Progress Notes (Signed)
Have not heard back from patient to reschedule his cancelled appointment from 11/19/18. Sent notification to referring provider.

## 2018-12-30 ENCOUNTER — Ambulatory Visit (INDEPENDENT_AMBULATORY_CARE_PROVIDER_SITE_OTHER): Payer: PRIVATE HEALTH INSURANCE | Admitting: Psychiatry

## 2018-12-30 ENCOUNTER — Encounter (HOSPITAL_COMMUNITY): Payer: Self-pay | Admitting: Psychiatry

## 2018-12-30 ENCOUNTER — Other Ambulatory Visit: Payer: Self-pay

## 2018-12-30 DIAGNOSIS — F411 Generalized anxiety disorder: Secondary | ICD-10-CM | POA: Diagnosis not present

## 2018-12-30 DIAGNOSIS — R41840 Attention and concentration deficit: Secondary | ICD-10-CM

## 2018-12-30 MED ORDER — BUPROPION HCL ER (XL) 150 MG PO TB24: 150.0000 mg | ORAL_TABLET | Freq: Every day | ORAL | 1 refills | Status: DC

## 2018-12-30 MED ORDER — AMPHETAMINE-DEXTROAMPHET ER 15 MG PO CP24: 15.0000 mg | ORAL_CAPSULE | Freq: Every day | ORAL | 0 refills | Status: DC

## 2018-12-30 NOTE — Progress Notes (Signed)
Virtual Visit via Telephone Note  I connected with David Leon on 12/30/18 at  9:00 AM EST by telephone and verified that I am speaking with the correct person using two identifiers.   I discussed the limitations, risks, security and privacy concerns of performing an evaluation and management service by telephone and the availability of in person appointments. I also discussed with the patient that there may be a patient responsible charge related to this service. The patient expressed understanding and agreed to proceed.   History of Present Illness: Patient was evaluated by phone session.  He was driving with his coworker and forgot his appointment but he kept the conversation and reported that he is doing much better since medicine dose were adjusted.  He is sleeping good.  He is able to do his task on time.  We have reduce Wellbutrin dose 150 mg.  He is taking Adderall 15 mg it is helping his multitasking, attention, focus and he is able to do his job on time.  So far he has not needed the trazodone since he is sleeping better.  We talked about switching from Adderall to Vyvanse and he is in the process of changing his insurance from next year and until then he like to keep his Adderall as he cannot afford Vyvanse.  He reported his appetite is okay.  Patient has diabetes, HIV and hypothyroidism.   Past psychiatric history; H/O ADD in the school and recommended to try Ritalin but never took. No h/o inpatient treatment, suicidal attempt, anger, mania, psychosis or PTSD. H/O anxiety all lifelong. Given Wellbutrin by PCP. We tried Vyvanse but he could not afford.    Psychiatric Specialty Exam: Physical Exam  ROS  There were no vitals taken for this visit.There is no height or weight on file to calculate BMI.  General Appearance: NA  Eye Contact:  NA  Speech:  Clear and Coherent and Normal Rate  Volume:  Normal  Mood:  Euthymic  Affect:  NA  Thought Process:  Goal Directed   Orientation:  Full (Time, Place, and Person)  Thought Content:  Logical  Suicidal Thoughts:  No  Homicidal Thoughts:  No  Memory:  Immediate;   Good Recent;   Good Remote;   Good  Judgement:  Good  Insight:  Present  Psychomotor Activity:  NA  Concentration:  Concentration: Good and Attention Span: Good  Recall:  Good  Fund of Knowledge:  Good  Language:  Good  Akathisia:  No  Handed:  Right  AIMS (if indicated):     Assets:  Communication Skills Desire for Improvement Housing Resilience Social Support Transportation  ADL's:  Intact  Cognition:  WNL  Sleep:   improved      Assessment and Plan: Attention deficit disorder, inattentive type.  Generalized anxiety disorder.  Patient doing better since Wellbutrin dose decreased.  He is taking Wellbutrin XL 150 mg in the morning and Adderall XR 15 mg daily.  Discussed medication side effects and benefits.  We will consider switching to Vyvanse on his next appointment once he had a new insurance and he cannot afford the medication.  I recommend to call us back if is any question or any concern.  Follow-up in 2 months.  Follow Up Instructions:    I discussed the assessment and treatment plan with the patient. The patient was provided an opportunity to ask questions and all were answered. The patient agreed with the plan and demonstrated an understanding of the instructions.  The patient was advised to call back or seek an in-person evaluation if the symptoms worsen or if the condition fails to improve as anticipated.  I provided 15 minutes of non-face-to-face time during this encounter.   Kathlee Nations, MD

## 2019-01-07 ENCOUNTER — Encounter: Payer: Self-pay | Admitting: Family Medicine

## 2019-01-28 ENCOUNTER — Telehealth (HOSPITAL_COMMUNITY): Payer: Self-pay | Admitting: *Deleted

## 2019-01-28 DIAGNOSIS — R41840 Attention and concentration deficit: Secondary | ICD-10-CM

## 2019-01-28 NOTE — Telephone Encounter (Signed)
Received call from pt requesting a refill of Adderall Xr 15mg . Last filled 12/30/18. Pt has an upcoming appointment on 02/23/19.

## 2019-01-28 NOTE — Telephone Encounter (Signed)
That is how it appears definitely too early. I'll let him know.

## 2019-01-28 NOTE — Telephone Encounter (Signed)
It was ordered on 12/30/18. He supposed to filled on 01/13/19. Is he taking more than he prescribed?

## 2019-02-02 ENCOUNTER — Other Ambulatory Visit (HOSPITAL_COMMUNITY): Payer: Self-pay | Admitting: Psychiatry

## 2019-02-02 ENCOUNTER — Encounter: Payer: Self-pay | Admitting: Family Medicine

## 2019-02-02 DIAGNOSIS — R41840 Attention and concentration deficit: Secondary | ICD-10-CM

## 2019-02-02 NOTE — Telephone Encounter (Signed)
Patient called front desk and upset that he did not receive his medication Adderall XR 15 mg daily.  I returned phone call and spoke to patient that his medicine is already called in on December 8 to be filled on December 22.  Patient told that he never received phone call from the pharmacy.  I explained that he need to call the pharmacy since I spoke to the pharmacist 5 minutes ago who has his prescription on hold.  Patient apparently not happy with the clarification and requested to be discharged from our office.  We will refer him out with list of providers in the community.

## 2019-02-02 NOTE — Progress Notes (Signed)
Patient called front desk and upset that he did not receive his medication Adderall XR 15 mg daily.  I returned phone call and spoke to patient that his medicine is already called in on December 8 to be filled on December 22.  Patient told that he never received phone call from the pharmacy.  I explained that he need to call the pharmacy since I spoke to the pharmacist 5 minutes ago who has his prescription on hold.  Patient apparently not happy with the clarification and requested to be discharged from our office.  We will refer him out with list of providers in the community. 

## 2019-02-03 ENCOUNTER — Telehealth (HOSPITAL_COMMUNITY): Payer: Self-pay | Admitting: *Deleted

## 2019-02-03 ENCOUNTER — Encounter (HOSPITAL_COMMUNITY): Payer: Self-pay | Admitting: Psychiatry

## 2019-02-03 NOTE — Telephone Encounter (Signed)
Pt called front desk requesting refill of Adderall 15mg  XR. Pt was irritable with this when Clinical research associate told pt that he has a refill to be filled 01/13/19. Writer explained that she had spoken with his pharmacy and they confirmed that. Pt asked to cx his next appointment and that he would be leaving this practice. Front desk had already spoken with him and cx any future appointments.

## 2019-02-23 ENCOUNTER — Ambulatory Visit (HOSPITAL_COMMUNITY): Payer: PRIVATE HEALTH INSURANCE | Admitting: Psychiatry

## 2019-03-02 ENCOUNTER — Ambulatory Visit: Payer: PRIVATE HEALTH INSURANCE | Admitting: Family Medicine

## 2019-03-02 ENCOUNTER — Other Ambulatory Visit: Payer: Self-pay

## 2019-03-02 ENCOUNTER — Encounter: Payer: Self-pay | Admitting: Family Medicine

## 2019-03-02 ENCOUNTER — Ambulatory Visit (INDEPENDENT_AMBULATORY_CARE_PROVIDER_SITE_OTHER): Payer: 59 | Admitting: Family Medicine

## 2019-03-02 VITALS — BP 112/78 | HR 107 | Temp 98.1°F | Ht 72.0 in | Wt 249.0 lb

## 2019-03-02 DIAGNOSIS — G2581 Restless legs syndrome: Secondary | ICD-10-CM

## 2019-03-02 DIAGNOSIS — E1065 Type 1 diabetes mellitus with hyperglycemia: Secondary | ICD-10-CM | POA: Diagnosis not present

## 2019-03-02 DIAGNOSIS — R41840 Attention and concentration deficit: Secondary | ICD-10-CM

## 2019-03-02 DIAGNOSIS — F411 Generalized anxiety disorder: Secondary | ICD-10-CM | POA: Diagnosis not present

## 2019-03-02 DIAGNOSIS — E039 Hypothyroidism, unspecified: Secondary | ICD-10-CM

## 2019-03-02 MED ORDER — AMPHETAMINE-DEXTROAMPHET ER 15 MG PO CP24: 15.0000 mg | ORAL_CAPSULE | ORAL | 0 refills | Status: DC

## 2019-03-02 MED ORDER — AMPHETAMINE-DEXTROAMPHET ER 15 MG PO CP24: 15.0000 mg | ORAL_CAPSULE | Freq: Every day | ORAL | 0 refills | Status: DC

## 2019-03-02 MED ORDER — BUPROPION HCL ER (XL) 300 MG PO TB24: 300.0000 mg | ORAL_TABLET | Freq: Every day | ORAL | 3 refills | Status: DC

## 2019-03-02 NOTE — Patient Instructions (Addendum)
[email or web address]  Good to see you today  Please reschedule your complete physical to 08/2019  Send me picture of med you are taking for your restless legs. I will ask your endocrinologist to add some labs

## 2019-03-02 NOTE — Progress Notes (Signed)
Subjective:    Patient ID: David Leon, male    DOB: [birth year], 29 y.o.   MRN: [id number]  HPI This is a 29 yo male who presents today for follow up of ADHD. Has seen Dr. Adele Schilder in past and is currently maintained on Adderall XR 15 mg daily. He would like his ADHD to be managed by PCP. No chest pain or palpitations. No increased headaches. Has some restless legs. Night. Worse with elevating legs. Taking an over the counter supplement, not sure what is in it. Sleeping 7-8 hours a night. Sleeping better, less nocturia.   Anxiety- seems to have increased when bupropion was decreased to 150, has been back up to 300 with improvement.   Blood sugars- daytime high is 210. Running higher at night. Went to diabetes education. Will be discussing insulin pump at upcoming visit. Follow up appointment on file.    Review of Systems    per HPI Objective:   Physical Exam Vitals reviewed.  Constitutional:      Appearance: Normal appearance. He is obese.  HENT:     Head: Normocephalic and atraumatic.  Cardiovascular:     Rate and Rhythm: Normal rate.  Pulmonary:     Effort: Pulmonary effort is normal.  Musculoskeletal:     Cervical back: Normal range of motion and neck supple.  Neurological:     Mental Status: He is alert and oriented to person, place, and time.  Psychiatric:        Mood and Affect: Mood normal.        Behavior: Behavior normal.        Thought Content: Thought content normal.        Judgment: Judgment normal.       BP 112/78 (BP Location: Left Arm, Patient Position: Sitting, Cuff Size: Normal)   Pulse (!) 107   Temp 98.1 F (36.7 C) (Temporal)   Ht 6' (1.829 m)   Wt 249 lb (112.9 kg)   SpO2 99%   BMI 33.77 kg/m  Wt Readings from Last 3 Encounters:  03/02/19 249 lb (112.9 kg)  11/19/18 242 lb 12.8 oz (110.1 kg)  11/03/18 244 lb 8 oz (110.9 kg)       Assessment & Plan:  1. GAD (generalized anxiety disorder) - discussed importance of non pharmacologic  interventions including regular exercise, adequate sleep - buPROPion (WELLBUTRIN XL) 300 MG 24 hr tablet; Take 1 tablet (300 mg total) by mouth daily.  Dispense: 90 tablet; Refill: 3  2. Attention and concentration deficit - Controlled substance contract reviewed and signed by patient - amphetamine-dextroamphetamine (ADDERALL XR) 15 MG 24 hr capsule; Take 1 capsule by mouth daily.  Dispense: 30 capsule; Refill: 0 - amphetamine-dextroamphetamine (ADDERALL XR) 15 MG 24 hr capsule; Take 1 capsule by mouth every morning.  Dispense: 30 capsule; Refill: 0 - amphetamine-dextroamphetamine (ADDERALL XR) 15 MG 24 hr capsule; Take 1 capsule by mouth every morning.  Dispense: 30 capsule; Refill: 0  3. Uncontrolled type 1 diabetes mellitus with hyperglycemia ([place]) - has upcoming appointment with endocrine, blood sugars have improved. Discussed barriers to making good food choices (time) and strategies for improvement.   4. Acquired hypothyroidism - followed by endocrine  5. Restless legs - will ask endocrine to get magnesium and ferritin at upcoming lab visit  - follow up in 6 months for CPE  This visit occurred during the SARS-CoV-2 public health emergency.  Safety protocols were in place, including screening questions prior to the visit, additional usage  of staff PPE, and extensive cleaning of exam room while observing appropriate contact time as indicated for disinfecting solutions.      Olean Ree, FNP-BC  [facility] at [facility], [facility]  03/05/2019 9:03 AM

## 2019-03-05 ENCOUNTER — Encounter: Payer: Self-pay | Admitting: Family Medicine

## 2019-03-31 ENCOUNTER — Encounter: Payer: Self-pay | Admitting: Family Medicine

## 2019-04-01 NOTE — Telephone Encounter (Signed)
Please call patient's pharmacist and ask them about filling generic Adderall XR (dextroamphetamine/amphetamine). The order was sent in for generic initially, ask if they need something different to fill generic which is covered better under patient's insurance.

## 2019-04-02 NOTE — Telephone Encounter (Signed)
Spoke with the pharmacy, states that the patient just picked up Rx #2 of the 3 Rxs for Adderall.  They are unable to run the Rx through their system at this time to see if there is a cheaper substitute that pops in as an alternative because he just filled the Rx on 03/30/2019.  The patient either contact his insurance to see what they say about the tier level and cost of this medication and similar Rxs OR he can wait until he's close to being due for refill in April and request they run the generic form through his insurance. The pharmacy stated that it may be that the generic form is more expensive than the one he is receiving - his Rx is marked as "auto substitute" so they will automatically substitute with the cheaper option. Marland Kitchen  Pt aware of all this.

## 2019-05-07 ENCOUNTER — Encounter: Payer: Self-pay | Admitting: Family Medicine

## 2019-05-08 ENCOUNTER — Other Ambulatory Visit: Payer: Self-pay | Admitting: Family Medicine

## 2019-05-08 DIAGNOSIS — R41840 Attention and concentration deficit: Secondary | ICD-10-CM

## 2019-05-08 MED ORDER — AMPHETAMINE-DEXTROAMPHET ER 15 MG PO CP24: 15.0000 mg | ORAL_CAPSULE | ORAL | 0 refills | Status: DC

## 2019-05-20 ENCOUNTER — Encounter: Payer: PRIVATE HEALTH INSURANCE | Admitting: Family Medicine

## 2019-06-02 ENCOUNTER — Telehealth: Payer: Self-pay

## 2019-06-02 NOTE — Telephone Encounter (Signed)
Patient advised and will come on 06/03/19 as scheduled

## 2019-06-02 NOTE — Telephone Encounter (Signed)
Patient called this morning stating he received a mychart message from [place] asking him to call us in regards to his appointment scheduled with Debbie for tomorrow 06/03/19. I do not see a mychart message from anyone but I went ahead and spoke to him about his symptoms.  Patient states he has a history of having b/p issues and has noticed the past 2 weeks that his readings are higher than they have been. Highest b/p reading has been 140ish/85-87 and the lowest it has been around 131-83. He has been getting headaches, especially in the afternoon and flushed face feeling when his b/p would spike. Has had some dizziness off and on the past 2 weeks also. No syncope, no chest pain or SOB and no other COVID related symptoms.  Patient is going out of town tomorrow after 11 am and will be gone for 2 weeks and that is why he made his appointment for early tomorrow morning. Please review. Is it ok for patient to still coming tomorrow morning?  ER precautions given to the patient.

## 2019-06-02 NOTE — Telephone Encounter (Signed)
That is fine.   Thank you

## 2019-06-03 ENCOUNTER — Other Ambulatory Visit: Payer: Self-pay | Admitting: Family Medicine

## 2019-06-03 ENCOUNTER — Other Ambulatory Visit: Payer: Self-pay

## 2019-06-03 ENCOUNTER — Encounter: Payer: Self-pay | Admitting: Family Medicine

## 2019-06-03 ENCOUNTER — Ambulatory Visit: Payer: 59 | Admitting: Family Medicine

## 2019-06-03 VITALS — BP 118/78 | HR 108 | Temp 98.6°F | Ht 72.0 in | Wt 250.0 lb

## 2019-06-03 DIAGNOSIS — G44229 Chronic tension-type headache, not intractable: Secondary | ICD-10-CM | POA: Diagnosis not present

## 2019-06-03 DIAGNOSIS — R41840 Attention and concentration deficit: Secondary | ICD-10-CM

## 2019-06-03 DIAGNOSIS — E6609 Other obesity due to excess calories: Secondary | ICD-10-CM

## 2019-06-03 DIAGNOSIS — R030 Elevated blood-pressure reading, without diagnosis of hypertension: Secondary | ICD-10-CM

## 2019-06-03 DIAGNOSIS — Z6833 Body mass index (BMI) 33.0-33.9, adult: Secondary | ICD-10-CM | POA: Diagnosis not present

## 2019-06-03 MED ORDER — AMPHETAMINE-DEXTROAMPHET ER 15 MG PO CP24: 15.0000 mg | ORAL_CAPSULE | ORAL | 0 refills | Status: DC

## 2019-06-03 NOTE — Telephone Encounter (Signed)
Prescription sent as requested.

## 2019-06-03 NOTE — Progress Notes (Signed)
Patient's local pharmacy out of Adderall XR. He is going out of town to [place] and wishes to get it filled there. A 30 day supply sent to requested pharmacy.

## 2019-06-03 NOTE — Telephone Encounter (Signed)
Pt called back and request adderal xr sent to [facility] on [address] in [place]. Information in pts pharmacy hx and pt [place] with pharmacy [place].

## 2019-06-03 NOTE — Telephone Encounter (Signed)
Noted. I am happy to send his prescription to another pharmacy and will await call back with more information.

## 2019-06-03 NOTE — Telephone Encounter (Signed)
Pt left v/m that local pharmacy does not have adderal in stock and pt wants to know if can send refill adderal to [facility] [address]. [place] is [place] for beach today. Pt was  Seen [facility] earlier today. Pt request cb. I left v/m requesting pt to cb; do not have [facility] [address] [address]. Will send FYI to Harlin Heys FNP and Ashtyn CMA & Tiphani Mells LPN.

## 2019-06-03 NOTE — Progress Notes (Signed)
Subjective:    Patient ID: David Leon, male    DOB: November 18, 1990, 29 y.o.   MRN: [id number]  HPI Chief Complaint  Patient presents with  . Hypertension    average 130s/80s, most recent 135/87. Pt also reports headaches and dizziness x 1 month - worsening, mostly in the afternoon. Some blurred vision at times.    This is a 29 yo male with above cc. Has been checking his blood pressure with automatic cuff at home.  Readings have been in the 120s to 130s over 80s to 90s.  Headaches over eyes. For two months. Has been taking ibuprofen 2 tablets most afternoons. Does not always make them go away.  Occasional lightheadedness. Poor water intake. Drinks caffeinated coffee 4 cups a day and 2 servings of Pepsi Zero. No nasal drainage, itchy/ watery eyes.  Is recently started wearing contacts again.  Does not seem to correlate with headaches. Has started working out. Blood sugars improved, using CGM. Has met with dietician.  Has been following up regularly with endocrinologist.  Sleep is not great, trouble with staying asleep. Restless legs occasionally, 2x/ week.  Tried trazodone in the past but did not like the way he felt in the mornings.  Is recently gotten some extended release melatonin to try.  Review of Systems Per HPI    Objective:   Physical Exam Vitals reviewed.  Constitutional:      General: He is not in acute distress.    Appearance: Normal appearance. He is obese. He is not ill-appearing, toxic-appearing or diaphoretic.  HENT:     Head: Normocephalic and atraumatic.  Eyes:     Conjunctiva/sclera: Conjunctivae normal.  Cardiovascular:     Rate and Rhythm: Tachycardia present.  Pulmonary:     Effort: Pulmonary effort is normal.  Musculoskeletal:     Cervical back: Normal range of motion and neck supple.  Neurological:     Mental Status: He is alert and oriented to person, place, and time.  Psychiatric:        Mood and Affect: Mood normal.        Behavior: Behavior normal.         Thought Content: Thought content normal.        Judgment: Judgment normal.       BP 118/78 (BP Location: Left Arm, Patient Position: Sitting, Cuff Size: Normal)   Pulse (!) 108   Temp 98.6 F (37 C) (Temporal)   Ht 6' (1.829 m)   Wt 250 lb (113.4 kg)   SpO2 98%   BMI 33.91 kg/m  Wt Readings from Last 3 Encounters:  06/03/19 250 lb (113.4 kg)  03/02/19 249 lb (112.9 kg)  11/19/18 242 lb 12.8 oz (110.1 kg)       Assessment & Plan:  1. Chronic tension-type headache, not intractable -Possible causes/triggers.  Patient will keep a headache log as directed and has follow-up appointment scheduled next month.  Discussed avoiding overuse of over-the-counter analgesics.  Encouraged him to significantly but gradually decrease his caffeine intake and increase his water intake -He will let me know if any worsening or increased frequency of headaches  2. Class 1 obesity due to excess calories with serious comorbidity and body mass index (BMI) of 33.0 to 33.9 in adult -He continues to struggle with some of his food choices and I provided him some written information as well as resources for him to look out prior to his appointment with a nutritionist upcoming.  3. Elevated blood pressure  reading -Normal in the office and per patient, slightly elevated with home readings.  Not sure if this is related to headaches but doubt that mildly elevated readings are causing his headaches. -He will continue to monitor and send me the readings  This visit occurred during the SARS-CoV-2 public health emergency.  Safety protocols were in place, including screening questions prior to the visit, additional usage of staff PPE, and extensive cleaning of exam room while observing appropriate contact time as indicated for disinfecting solutions.    Clarene Reamer, FNP-BC  [facility] at [facility], [facility]  06/03/2019 10:18 AM

## 2019-06-03 NOTE — Patient Instructions (Addendum)
For headaches, decrease your caffeine intake to one serving a day. Increase your water  Keep a log of headaches, frequency, any triggers? , how long they last and what makes them go away. Send me how you are doing in 1- 2 weeks, sooner if any worsening.     [email or web address] Shanda Howells Wylene Simmer  "No Sugar, No Starch" Diet Overview   Introduction  This diet, to put it quite simply, is a diet low in sugary and starchy foods. The diet consists of "real" food and includes meat, fish, cheese, eggs, salads, and vegetables. This eating plan will provide your body with the nutrition that it needs, while limiting food that your body does not need, especially nutritionally-empty carbohydrates. Your carbohydrate intake will be around 20 grams or less per day. This means that you will need to avoid sugar, bread, fruit, flour, pasta, or any other sugary/starchy foods that have a high carbohydrate content. We will provide you with a list of foods to assist you in changing your eating patterns. One of the advantages of following a low-carbohydrate diet is that it tends to have a natural appetite reduction effect; therefore, you do not have to be hungry while losing weight.   Medical supervision is recommended for any weight loss program, especially if you are currently taking any medications. As your weight decreases and your health improves, medications will be adjusted accordingly. We will also monitor blood work to make sure that your numbers are within the normal range. In addition, research has shown that there is a positive relationship between how often you see your doctor and how much weight you lose. Many of our patients have reported that their regularly-scheduled appointments help to ensure that they follow the diet.   Side Effects  The "no sugar, no starch" diet does have some potential side effects, as does any effective weight loss program. Although they will not seriously impact your health  and will disappear quickly, they can be frustrating and bothersome. These side effects include bad breath, constipation, sugar cravings, and flu-like symptoms. The following recommendations can help you avoid or minimize these side effects.   Drink Up: Your body's cells need fluids to function properly. It is important to drink an adequate amount of fluid per day - preferably water or another non-caffeinated beverage. Not only is water good for your cells, water also helps prevent constipation and bad breath.   Constipation: If you are experiencing constipation, there are a number of ways that you may address the issue. Dr. Danny Lawless will help you choose the approach that is right for you.  ? use 1 teaspoon of milk of magnesia at bedtime daily  ? add  cup of fiber-rich vegetables to your diet per day  ? have 1 to 2 servings per day of sugar-free gum or sugar-free candy that contains sorbitol or another sugar alcohol  ? use sugar-free Metamucil twice a day   Breath: Some people experience bad breath in the initial stages of a "no sugar, no starch" diet. This can usually be avoided by drinking plenty of water and performing good oral hygiene. This includes seeing a dentist, brushing your teeth twice a day (including your tongue), and flossing daily. If the problem persists, try sugar-free gum or mints.   Sugar cravings: As your body adjusts to your new way of eating, you may initially experience cravings for the high-carbohydrate foods that are not on your diet. Remember that the fewer carbohydrates you have in your diet,  the sooner the cravings will subside. In the meantime, eat plenty of protein and do not try to reduce fat intake as you eliminate carbohydrates; focus on the many delicious foods that you are allowed to have on your diet. A sugar-free beverage, such as diet soda or Crystal Lite, or some sugar-free jello may help with these cravings as well. If cravings become distracting and unmanageable,  contact Dr. Leeanne Rio. He will have additional suggestions to get you through this initial period of adjustment.   Bouillon: Occasionally, patients can experience fatigue, headaches, body aches, difficulty concentrating, or other flu-like symptoms when they begin the diet. These symptoms are usually fairly mild and pass quickly; they are a sign that your body is going through a transitional period from burning sugars and starches to burning fat for fuel. To help prevent these symptoms, we recommend drinking beef or chicken broth up to three times a day (DO NOT USE LOW SODIUM BOUILLON unless instructed to do so by Dr. Leeanne Rio). This will replenish electrolytes while your body is going through this transition. To make the broth, simply drop a cube of bouillon into a cup of hot water and drink it. Although your energy levels will soon return to normal, many patients have reported they enjoy the broth and continue drinking it beyond the first week. If you have lingering symptoms of this type after the first few weeks, you may have another health concern that should be monitored. Please alert Dr. Leeanne Rio if this should happen.   Ketosis  Ketosis is a state in which your body produces ketones. Ketones are the direct result of strictly following a "no sugar, no starch" diet and are a sign that your body is burning fat for fuel. You may measure your urine ketones at home if you wish with urinary ketone test strips from your local pharmacy. If there are no ketones in your urine, we will try to determine what, if anything, in your diet is preventing you from entering ketosis. In some instances, people are unable to go into ketosis. However, these people can still lose weight and enjoy better blood-sugar control while following the "no sugar, no starch" diet.   What happens if I "slip"?  Once you begin this diet it is important that you follow the dietary guidelines we provide. If you decide to "slip," even a little bit,  you may effectively stop the weight loss process for up to three days. This means you will come out of ketosis, and you may gain back several pounds of water weight. You can also expect blood sugar control to briefly worsen. The most important thing to do if you "slip" is to get right back on track. Don't wait until the next day; make sure your next meal is a low-carbohydrate meal. If you find that you are struggling with the diet, call Dr. Leeanne Rio for advice.   Vitamins and Supplements  Although the "no sugar, no starch" diet is very nutritious, we recommend that you take an iron-free multivitamin to be sure that you are getting all of the vitamins and minerals that you need. You do not have to spend a lot of money on vitamins. A once-a-day vitamin like Centrum Silver or its generic equivalent will meet your needs and is affordable.   In addition to a multivitamin, we recommend that you take 1000 mg per day of fish oil with a meal. You can take this at one time or in two doses of 500 mg. Choose  a good quality fish oil to avoid any stomach upset and always take this supplement with food. We will let you know if we believe that you need additional supplementation.   Cholesterol  Many patients ask how the "no sugar, no starch" diet will affect cholesterol levels since it isn't a low-fat diet. Quite simply, if you follow the diet as we prescribe, your cholesterol levels will improve. To ensure this is the case and for your own information, we will periodically check your cholesterol levels.   Your weight is not the only thing that will improve.  If you adhere to your new way of eating, you can expect to lose pounds and inches. As you move towards your weight loss goals, remember that there is more to a healthier body than numbers on the scale. You may experience improved energy levels, better appetite control, and, in general, a reduction in the frequency and severity of the symptoms of a number of health  concerns you may have experienced before beginning the diet.   If you are diabetic, you can expect better blood sugar control and a reduction in your diabetes medications. Some patients are able to stop taking their diabetes medications completely! However, if you are taking diabetes medications, including insulin, do not change the dosage or stop taking them unless instructed to do so by Dr. Danny Lawless.   Increasing Activity and Reducing Stress  In addition to changing your eating habits, you may wish to change some other aspects of your lifestyle in order to improve your overall health. Stress and inactivity can negatively impact your health and even make it more difficult for you to lose weight. Reducing stress may improve your ability to handle dietary temptations, sugar cravings, and emotional eating patterns. Increasing your activity level may help reduce stress, decrease appetite, build muscle, and improve bone density.   Support for Lifestyle Change  We have formed a support group for patients and others in the community who are following a "no sugar, no starch" diet. This group meets on the first Tuesday of each month at 6:30 PM at the [facility] (across the street from the clinic). To register for the support group meeting, you can call [phone] or 888-ASK-DUKE and ask for the "Low Glycemic Diet Support Group." or register on-line at [email or web address]. The program is free and open to the public.   Contact Information  [facility] Office: [phone]  Email: [email or web address] Fax: [phone]   "No Sugar, No Starch" Diet: Getting Started   LIST OF PERMITTED FOODS  For most effective weight loss, you will need to keep the total number of carbohydrate grams to less than 20 grams per day. Your diet is to be made up exclusively of foods and beverages from this handout. All food may be cooked in a microwave oven, baked, boiled, stir-fried, sauted, roasted, fried (with no  flour, breading, or corn meal), or grilled.   When you are hungry, EAT YOUR CHOICE OF THE FOLLOWING FOODS:  Meat: Beef (hamburger, steak, etc.), pork, ham (unglazed), bacon, lamb, veal, or other meats. With processed meats (sausage, pepperoni, hot dogs, etc.), check the label-carb count should be about 1 gram per serving.  Poultry: Chicken, [place], duck, or other fowl  Fish & Shellfish: Any fish including tuna, salmon, catfish, bass, trout, shrimp, scallops, crab, and lobster.  Eggs: Whole eggs are permitted without restrictions.  You do not have to avoid the fat that comes with these foods.  You do not have  to deliberately limit quantities, but you should stop eating when you feel full.   FOODS THAT MUST BE EATEN EVERY DAY:  Salad Greens: ___ cups a day. Includes: arugula, bok choy, cabbage (all varieties), chard, chives, endive, greens (all varieties including beet, collards, mustard, and turnip), kale, lettuce (all varieties), parsley, spinach, radicchio, radishes, scallions, and watercress. (If it is a leaf-you can eat it.)   Vegetables: cup (measured uncooked) a day. Includes: artichokes, asparagus, broccoli, Brussels sprouts, cauliflower, celery, cucumber, eggplant, green beans (string beans), jicama, leeks, mushrooms, okra, onions, peppers, pumpkin, shallots, snow peas, sprouts (bean & alfalfa) sugar-snap peas, summer squash, tomatoes, rhubarb, wax beans, zucchini.   AS NEEDED during the first few weeks of the diet:  Bouillon: up to times daily-as needed for sodium replenishment. Clear broth (consomm) is strongly recommended. Unless we tell you that you need to restrict your salt intake, DO NOT USE LOW SODIUM BOUILLON.   FOODS ALLOWED IN LIMITED QUANTITIES:  Cheese: up to ounces a day. Check the label; carb count should be < 1 gram / serving.  Cream: up to tablespoonfuls a day. Includes heavy, light, or sour cream  Mayonnaise: up to tablespoons a day. Duke's and Hellman's are low-carb.   Olives (black or green): up to a day.  Avocado: up to of a fruit a day.  Lemon/lime juice: up to teaspoonfuls a day.  Soy sauces: up to tablespoons a day. Shirl HarrisKikkoman is a low-carb brand.  Pickles, dill or sugar-free: up to serving a day. [place] NurseOlive makes sugar-free pickles.   SNACKS: Pork rinds/skins; pepperoni slices; ham, beef, [place]key, etc. roll ups; deviled eggs  THE PRIMARY RESTRICTION: CARBOHYDRATES   On this diet, no sugars (simple carbohydrates) and no starches (complex carbohydrates) are eaten. The only carbohydrates we encourage are the nutritionally-dense, fiber rich vegetables listed on page 4.   Sugars are simple carbohydrates. Avoid these kinds of foods: white sugar, brown sugar, honey, maple syrup, molasses, corn syrup, beer (contains barley malt), milk (contains lactose), flavored yogurts, fruit juice, and fruit.   Starches are complex carbohydrates. Avoid these kinds of foods: grains (even "whole" grains), rice, cereals, flour, cornstarch, breads, pastas, muffins, bagels, crackers, and "starchy" vegetables such as slow-cooked beans (pinto, lima, black beans, etc.), carrots, parsnips, corn, peas, potatoes, [place] fries, potato chips, etc.   FATS & OILS   All fats and oils, even butter, are allowed. Olive oil and peanut oil are especially healthy oils and are encouraged in cooking. Avoid margarine and other hydrogenated oils that contain transfats.   For salad dressings, the ideal dressing is a homemade oil and vinegar dressing, with lemon juice and spices as needed. Bleu cheese, ranch, Melven SartoriusCaesar, [place] are also acceptable if the label says 1-2 grams of carbohydrate per serving or less. Avoid "lite" dressings, as these commonly have more carbohydrate. Chopped eggs, bacon, and/or grated cheese may also be included in salads.   Fats, in general, are important to include because they taste good and make you feel full. You, therefore, are permitted the fat or skin that is served with the  meat or poultry that you eat, as long as there is no breading on the skin. Do not attempt to follow a low-fat diet!   SWEETENERS AND DESSERTS   If you feel the need to eat or drink something sweet, you should select the most sensible alternative sweetener(s) available. Some available alternative sweeteners are: Splenda (sucralose), Nutrasweet (aspartame), Truvia (stevia/erythritol blend), and Sweet & Low (saccharin). Because they occasionally cause  stomach upset, avoid food with sugar alcohols (sorbitol, maltitol, etc.) for now, although they may be permitted in limited quantities in the future.   BEVERAGES   Drink as much as you would like of the allowed beverages, but do not force fluids beyond your capacity. The best beverage is water. Essence-flavored seltzers (zero carbs) and bottled spring and mineral waters are also good choices.   Caffeinated beverages: Some patients find that their caffeine intake interferes with their weight loss and blood sugar control. With this in mind, you may have up to servings of coffee (black, or with artificial sweetener and/or cream), tea (unsweetened or artificially sweetened), or caffeinated diet soda per day.   ALCOHOL   At first, we ask that you avoid alcohol consumption on this diet. As weight loss and dietary patterns become well-established, alcohol in moderate quantities may be added back into the diet at a later point in time. We can help you make the best choices for low-carbohydrate alcoholic beverages if needed.   QUANTITIES   Eat when you are hungry; stop when you are full. The diet works best on a "demand feeding" basis; i.e., eat whenever you are hungry; try not to eat more than what will satisfy you. Learn to listen to your body. A low-carbohydrate diet has a natural appetite reduction effect to ease you into the consumption of smaller and smaller quantities comfortably. Therefore, do not eat everything on your plate "just because it's there." On  the other hand, don't be hungry! You are not counting calories. Enjoy losing weight comfortably, without hunger or cravings.   We do recommend that you start your day with a nutritious low-carbohydrate meal. If you need suggestions for quick and easy breakfast ideas, look at the initial recipe handout. Also note that many medications and nutritional supplements need to be taken with food at each meal or three times per day.   IMPORTANT TIPS AND REMINDERS   The following items are NOT on the diet: sugar, bread, cereal, flour-containing items, fruits, juices, honey, whole or skimmed milk, yogurt, canned soups, dairy substitutes, catsup, sweet condiments and relishes, etc.   Avoid these common mistakes: Beware of "fat-free" or "lite" diet products and foods containing "hidden" sugars and starches (such as coleslaw or sugar-free cookies and cakes ). Check the labels of liquid medications, cough syrups, cough drops, and other over-the-counter medications that may contain sugar.   You may find the following books and websites informative:  The New Atkins for a New You, Atkins Diabetes Revolution, Dr. Vickey Sages' New Diet Revolution. 1001 Low-Carb Recipes by Sonda Rumble; The Low-Carb Cookbook and Living Low-Carb, both by Dellia Cloud .   Linda's Low-Carb Menus & Recipes: DyeTool.uy  Active Low-Carbers Forum: http://www.lowcarb.ca  Adah Perl website: ApartmentProfile.is  Hulda Marin blog: www.dietdoctor.com  Jimmy Moore's blog: www.livinlavidalocarb.blogspot.com  Vernona Rieger Dolson's low carb writings at About.com  Lauren Benning's Healthy Low-Carb Treats: http://www.healthyindulgences.blogspot.com  A sample day may look like this:  Breakfast  Bacon or sausage  Eggs  Lunch  Grilled chicken on top of salad greens and other vegetables, with bacon, chopped eggs, and salad dressing  Snack  Pepperoni slices and a cheese stick  Dinner  Burger patty or steak  Green salad with  other vegetables and salad dressing  Green beans with butter   Reading a low-carb label  Start by checking the Nutrition Facts.  ? Look at serving size, total carbohydrate, and fiber.  ? ____ Use Total Carbohydrate Content only  ? ____ Bonita Quin  may subtract fiber from total carbohydrate to get the "effective or net carb count"  o In the example below, 7 grams carbohydrate - 3 grams fiber = 4 grams of effective carbs  o That means the effective carb count of cauliflower is 4 grams per serving.

## 2019-06-26 ENCOUNTER — Encounter: Payer: PRIVATE HEALTH INSURANCE | Admitting: Family Medicine

## 2019-07-09 IMAGING — CT CT ABD-PELV W/ CM
2 of 4 series · 16 of 46 positions shown, 18 images · IV contrast (iopamidol)
Comparison: None.

CLINICAL DATA: Patient here from home with complaints of lower back
pain and fever x2 days.

EXAM:
CT ABDOMEN AND PELVIS WITH CONTRAST
TECHNIQUE: Multidetector CT imaging of the abdomen and pelvis was performed
using the standard protocol following bolus administration of
intravenous contrast.
CONTRAST:  100mL XP2D2O-UNN IOPAMIDOL (XP2D2O-UNN) INJECTION 61%

[Series 2: axial st · axial · 0.77mm/px · z∈[-575,-160]mm · 13 of 93 slices shown, 15 images]
[im 5/93  soft-tissue]
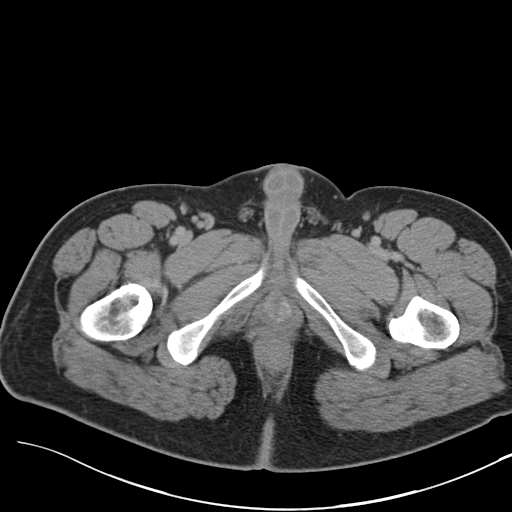
[im 5/93  bone]
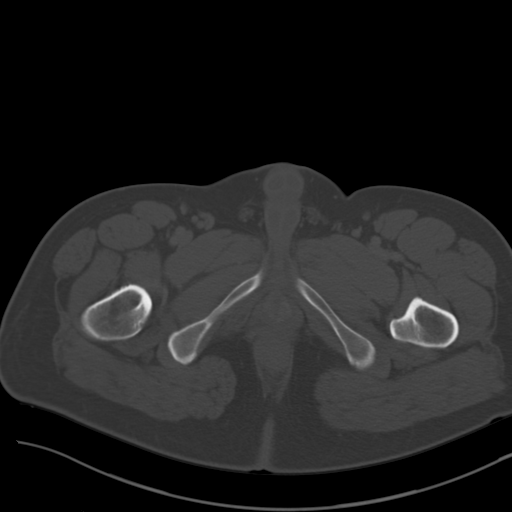
[im 14/93  soft-tissue]
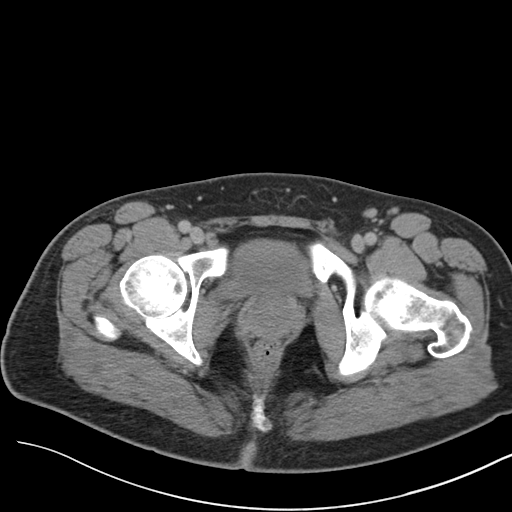
[im 19/93  soft-tissue]
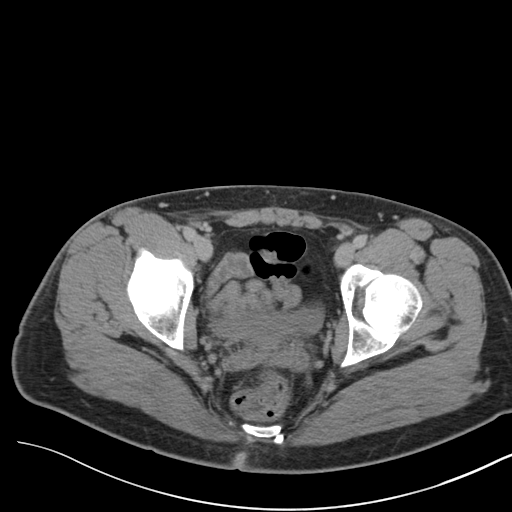
[im 28/93  soft-tissue]
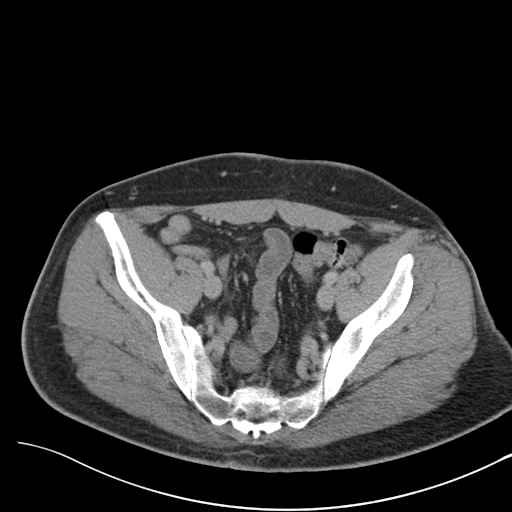
[im 33/93  soft-tissue]
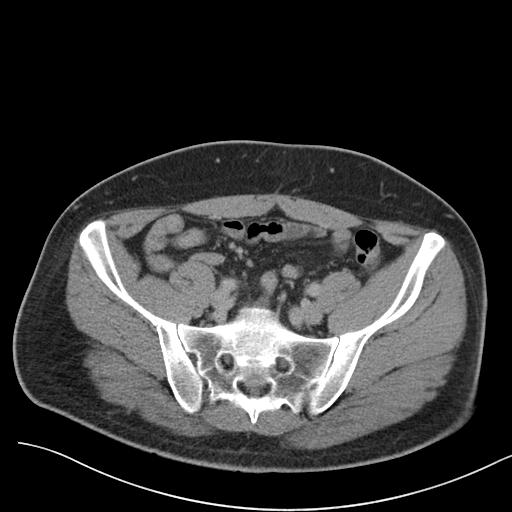
[im 42/93  soft-tissue]
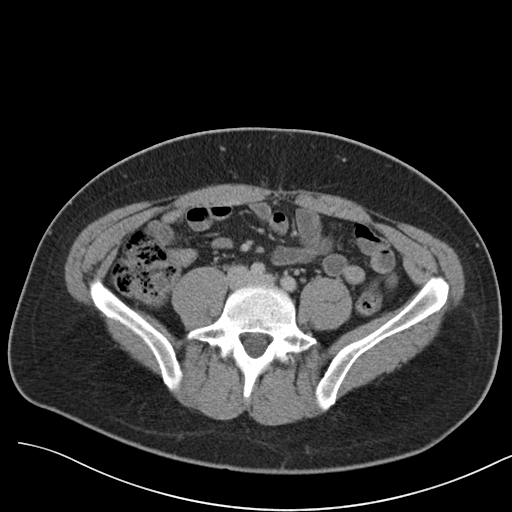
[im 47/93  soft-tissue]
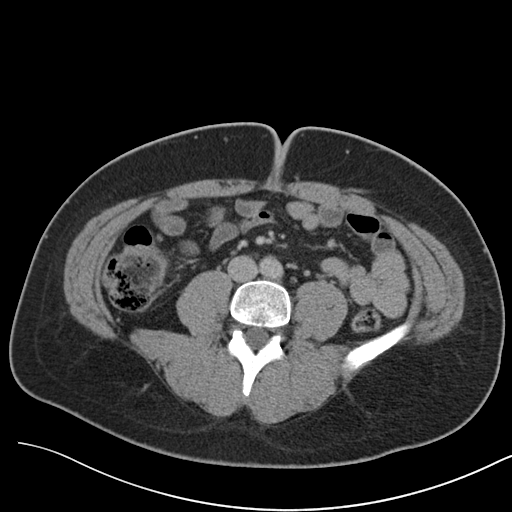
[im 51/93  soft-tissue]
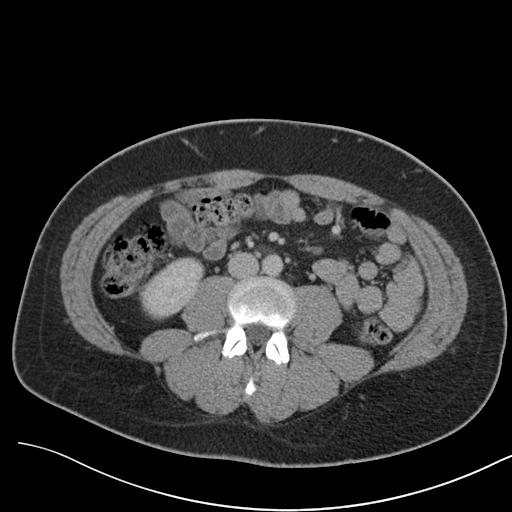
[im 60/93  soft-tissue]
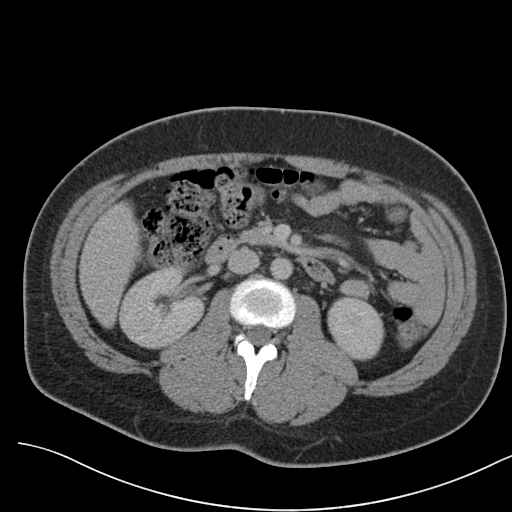
[im 60/93  bone]
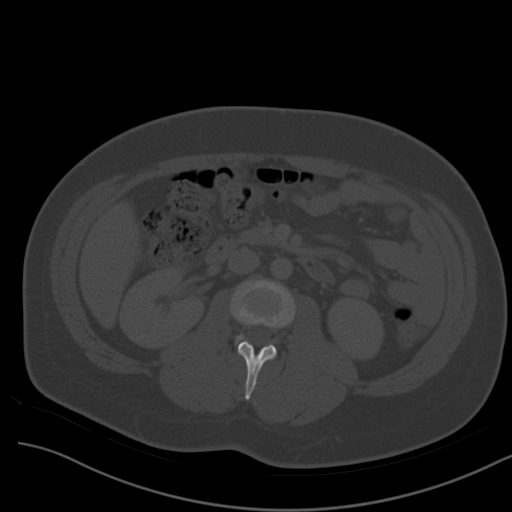
[im 65/93  soft-tissue]
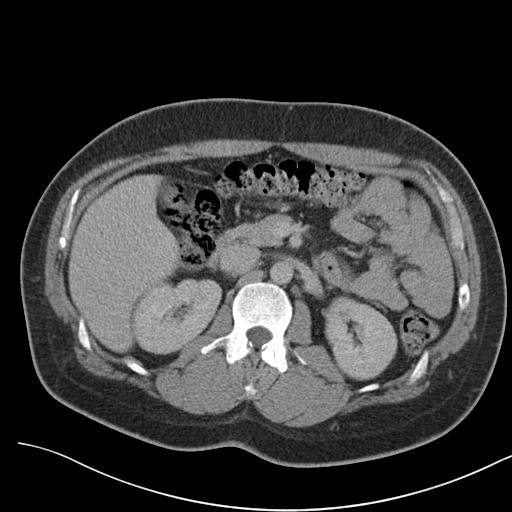
[im 74/93  soft-tissue]
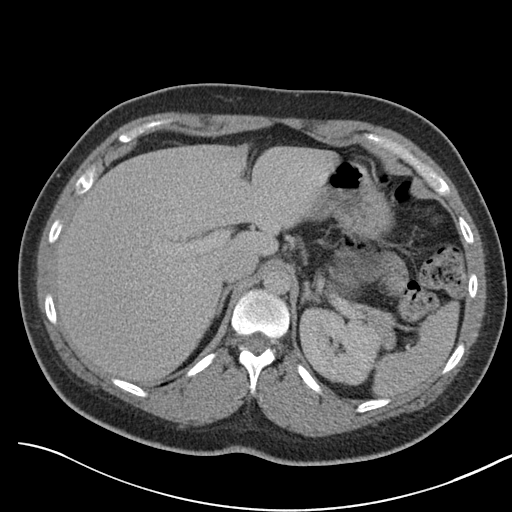
[im 79/93  soft-tissue]
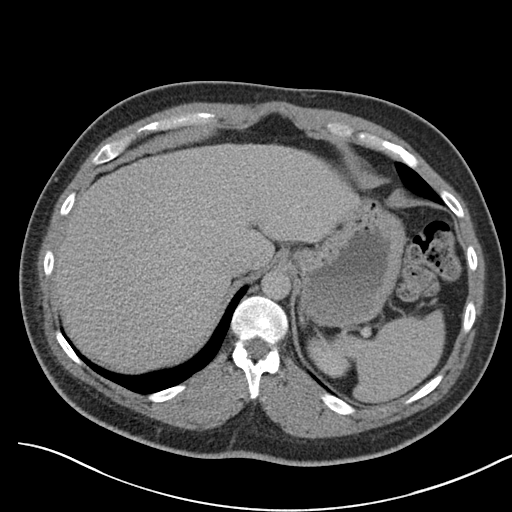
[im 88/93  soft-tissue]
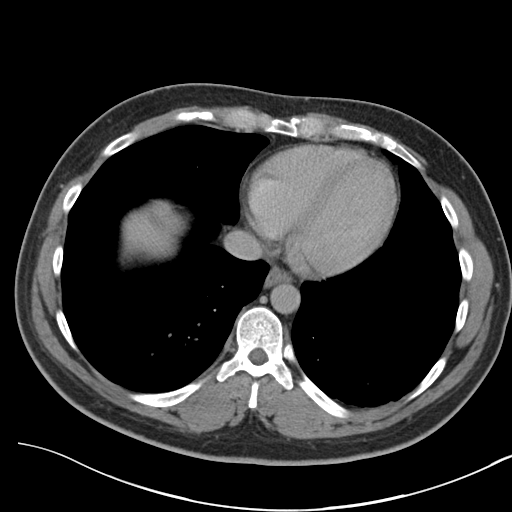

[Series 4: coronal st · coronal · 0.73mm/px · 3 of 88 slices shown]
[im 30/88  soft-tissue]
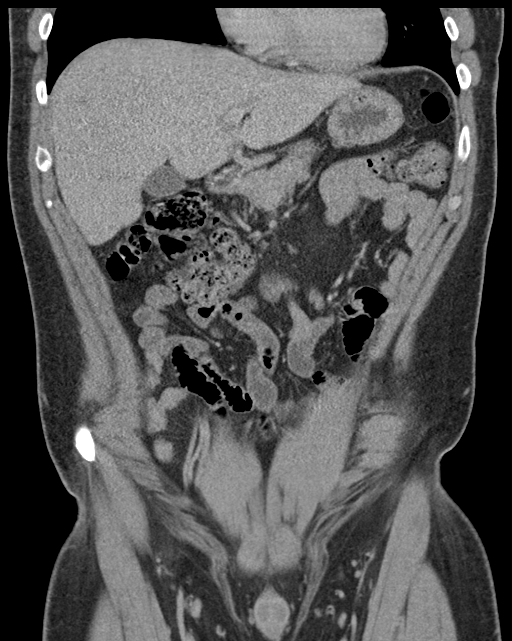
[im 39/88  soft-tissue]
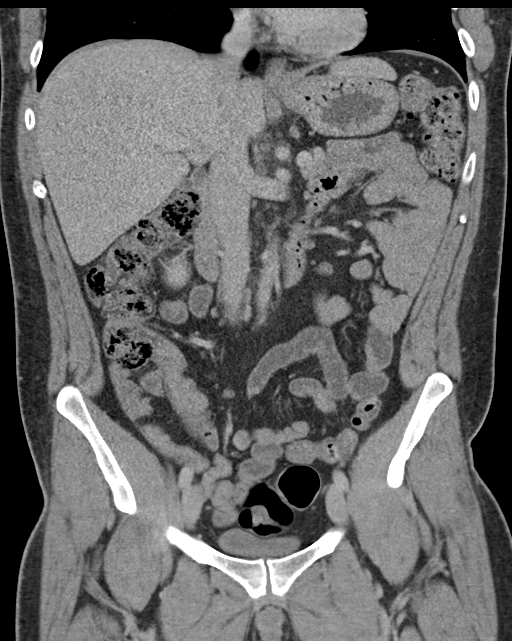
[im 49/88  soft-tissue]
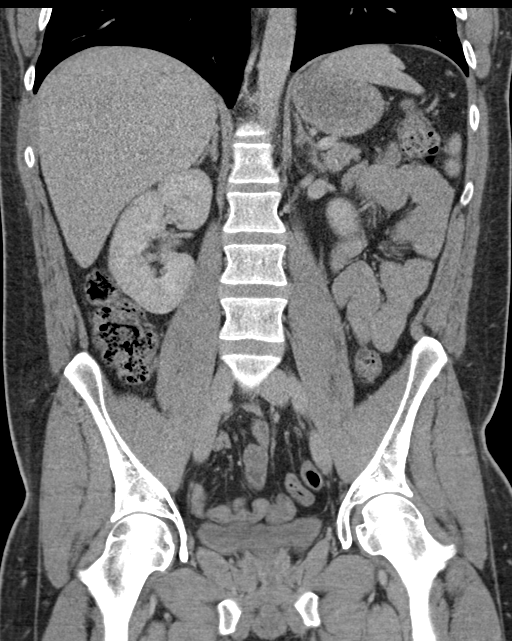

[16 of 46 positions shown; findings below may reference images not displayed]

FINDINGS: Lower chest: No acute abnormality.

Hepatobiliary: No focal liver abnormality is seen. No gallstones,
gallbladder wall thickening, or biliary dilatation.

Pancreas: Unremarkable. No pancreatic ductal dilatation or
surrounding inflammatory changes.

Spleen: Normal in size without focal abnormality.

Adrenals/Urinary Tract: Adrenal glands are unremarkable. Kidneys are
normal, without renal calculi, focal lesion, or hydronephrosis.
Bladder is unremarkable.

Stomach/Bowel: Stomach is within normal limits. Appendix appears
normal. No evidence of bowel wall thickening, distention, or
inflammatory changes. Large amount of stool in the ascending and
transverse colon.

Vascular/Lymphatic: No significant vascular findings are present. No
enlarged abdominal or pelvic lymph nodes.

Reproductive: Prostate is unremarkable.

Other: No abdominal wall hernia or abnormality. No abdominopelvic
ascites.

Musculoskeletal: No acute osseous abnormality. No aggressive osseous
lesion.
IMPRESSION: 1. No acute abdominal or pelvic pathology.

## 2019-08-03 ENCOUNTER — Encounter: Payer: PRIVATE HEALTH INSURANCE | Admitting: Family Medicine

## 2019-08-03 DIAGNOSIS — Z0289 Encounter for other administrative examinations: Secondary | ICD-10-CM

## 2019-09-09 ENCOUNTER — Other Ambulatory Visit: Payer: Self-pay | Admitting: Family Medicine

## 2019-09-09 ENCOUNTER — Other Ambulatory Visit: Payer: Self-pay

## 2019-09-09 DIAGNOSIS — R41840 Attention and concentration deficit: Secondary | ICD-10-CM

## 2019-09-09 MED ORDER — AMPHETAMINE-DEXTROAMPHET ER 15 MG PO CP24: 15.0000 mg | ORAL_CAPSULE | ORAL | 0 refills | Status: DC

## 2019-09-12 ENCOUNTER — Other Ambulatory Visit: Payer: Self-pay | Admitting: Family Medicine

## 2019-09-12 DIAGNOSIS — R41840 Attention and concentration deficit: Secondary | ICD-10-CM

## 2019-12-23 ENCOUNTER — Encounter: Payer: Self-pay | Admitting: Family Medicine

## 2019-12-30 ENCOUNTER — Other Ambulatory Visit: Payer: Self-pay

## 2019-12-30 ENCOUNTER — Ambulatory Visit: Payer: 59 | Admitting: Family Medicine

## 2019-12-30 DIAGNOSIS — E1065 Type 1 diabetes mellitus with hyperglycemia: Secondary | ICD-10-CM

## 2019-12-30 DIAGNOSIS — R41840 Attention and concentration deficit: Secondary | ICD-10-CM

## 2019-12-30 DIAGNOSIS — Z21 Asymptomatic human immunodeficiency virus [HIV] infection status: Secondary | ICD-10-CM | POA: Diagnosis not present

## 2019-12-30 DIAGNOSIS — Z6836 Body mass index (BMI) 36.0-36.9, adult: Secondary | ICD-10-CM

## 2019-12-30 MED ORDER — AMPHETAMINE-DEXTROAMPHET ER 20 MG PO CP24: 20.0000 mg | ORAL_CAPSULE | ORAL | 0 refills | Status: DC

## 2019-12-30 NOTE — Patient Instructions (Signed)
Good to see you today  Please schedule a 6 month follow up for annual exam

## 2019-12-30 NOTE — Progress Notes (Signed)
Subjective:    Patient ID: David Leon, male    DOB: Apr 22, 1990, 29 y.o.   MRN: [id number]  HPI Chief Complaint  Patient presents with  . Follow-up    x72mos   This is a 29 yo male who presents today for follow up of ADHD and DM type 2.   ADHD- takes adderall xr  15 mg around 7 am, goes to work around 8, feels like it wears off around mid day, takes every day of the week. No chest pain,  difficulty sleeping, palpitations.  Good results with current dosage, is able to concentrate and do his work.  DM type 2- using continuous glucose monitor, needs follow up with endocrinology. Currently on basaglar 50 units in am, Novolog sliding scale 4-5 units tid prn. Mostly chicken, veggies, sweet potato. Nuts for snacks, pork rinds, drinks coffee 1/2 and 1/2. Diet soda, skim milk.  Has not been very careful with diet and has put on some weight.  GERD- increased, uses omeprazole once a week as needed. Triggers unknown.    Review of Systems Per HPI    Objective:   Physical Exam Physical Exam  Vitals reviewed. Constitutional: Oriented to person, place, and time. Appears well-developed and well-nourished.  Obese. HENT:  Head: Normocephalic and atraumatic.  Eyes: Conjunctivae are normal.  Neck: Normal range of motion. Neck supple.  Cardiovascular: Normal rate.   Pulmonary/Chest: Effort normal.  Musculoskeletal: Normal range of motion.  Neurological: Alert and oriented to person, place, and time.  Psychiatric: Normal mood and affect. Behavior is normal. Judgment and thought content normal.      BP 120/82 (BP Location: Right Arm, Patient Position: Sitting, Cuff Size: Large)   Pulse 94   Temp 98.1 F (36.7 C) (Temporal)   Ht 5\' 11"  (1.803 m)   Wt 258 lb 6.4 oz (117.2 kg)   SpO2 99%   BMI 36.04 kg/m  Wt Readings from Last 3 Encounters:  12/30/19 258 lb 6.4 oz (117.2 kg)  06/03/19 250 lb (113.4 kg)  03/02/19 249 lb (112.9 kg)   Depression screen [facility] 2/9 12/30/2019 11/03/2018  10/11/2017 02/27/2016  Decreased Interest 0 0 0 -  Down, Depressed, Hopeless 0 0 0 2  PHQ - 2 Score 0 0 0 2  Altered sleeping - - - 3  Tired, decreased energy - - - 3  Change in appetite - - - 3  Feeling bad or failure about yourself  - - - 0  Moving slowly or fidgety/restless - - - 0  Suicidal thoughts - - - 0  PHQ-9 Score - - - 11  Difficult doing work/chores - - - Extremely dIfficult       Assessment & Plan:  1. Attention and concentration deficit -We will try to increase his dose from 15-20 and see if this does better for him, he will let me know via MyChart -Follow-up in 6 months for CPE - amphetamine-dextroamphetamine (ADDERALL XR) 20 MG 24 hr capsule; Take 1 capsule (20 mg total) by mouth every morning.  Dispense: 30 capsule; Refill: 0 - amphetamine-dextroamphetamine (ADDERALL XR) 20 MG 24 hr capsule; Take 1 capsule (20 mg total) by mouth every morning.  Dispense: 30 capsule; Refill: 0 - amphetamine-dextroamphetamine (ADDERALL XR) 20 MG 24 hr capsule; Take 1 capsule (20 mg total) by mouth every morning.  Dispense: 30 capsule; Refill: 0  2. Class 2 severe obesity due to excess calories with serious comorbidity and body mass index (BMI) of 36.0 to 36.9 in adult (  HCC) -Discussed his diet and encouraged him to decrease carbohydrates, increase daily exercise even if just 15 to 20 minutes of walking  3. Asymptomatic HIV infection (HCC) -Continue follow-up [facility] infectious disease at [facility]  4.  Uncontrolled type 1 diabetes mellitus with hyperglycemia -He reports good control according to his continuous glucose monitor.  He is overdue for endocrine follow-up.  I offered to draw labs in the office today to check hemoglobin A1c, lipids, etc. but he declined and assures me that he will be able to get in with endocrine within the next 1 to 2 weeks.  I emphasized importance of regular follow-up, decreasing his carbohydrate intake, regular exercise  This visit occurred during the  SARS-CoV-2 public health emergency.  Safety protocols were in place, including screening questions prior to the visit, additional usage of staff PPE, and extensive cleaning of exam room while observing appropriate contact time as indicated for disinfecting solutions.    Olean Ree, FNP-BC  [facility], [facility]  12/31/2019 8:43 AM

## 2019-12-31 ENCOUNTER — Encounter: Payer: Self-pay | Admitting: Family Medicine

## 2019-12-31 DIAGNOSIS — E66812 Obesity, class 2: Secondary | ICD-10-CM | POA: Insufficient documentation

## 2020-02-17 DIAGNOSIS — E039 Hypothyroidism, unspecified: Secondary | ICD-10-CM | POA: Diagnosis not present

## 2020-02-17 DIAGNOSIS — E785 Hyperlipidemia, unspecified: Secondary | ICD-10-CM | POA: Diagnosis not present

## 2020-02-17 DIAGNOSIS — E1065 Type 1 diabetes mellitus with hyperglycemia: Secondary | ICD-10-CM | POA: Diagnosis not present

## 2020-02-17 DIAGNOSIS — E1069 Type 1 diabetes mellitus with other specified complication: Secondary | ICD-10-CM | POA: Diagnosis not present

## 2020-02-17 LAB — ALBUMIN, URINE, RANDOM: Albumin ELP, Urine: 17

## 2020-02-17 LAB — CREATININE, URINE, RANDOM: Creatinine, Random Urine: 183.9

## 2020-02-17 LAB — MICROALBUMIN / CREATININE URINE RATIO: Microalb Creat Ratio: 9.2

## 2020-03-14 DIAGNOSIS — B20 Human immunodeficiency virus [HIV] disease: Secondary | ICD-10-CM | POA: Diagnosis not present

## 2020-03-14 DIAGNOSIS — Z113 Encounter for screening for infections with a predominantly sexual mode of transmission: Secondary | ICD-10-CM | POA: Diagnosis not present

## 2020-03-14 DIAGNOSIS — E119 Type 2 diabetes mellitus without complications: Secondary | ICD-10-CM | POA: Diagnosis not present

## 2020-03-14 DIAGNOSIS — Z880 Allergy status to penicillin: Secondary | ICD-10-CM | POA: Diagnosis not present

## 2020-03-14 DIAGNOSIS — E785 Hyperlipidemia, unspecified: Secondary | ICD-10-CM | POA: Diagnosis not present

## 2020-03-14 DIAGNOSIS — Z87891 Personal history of nicotine dependence: Secondary | ICD-10-CM | POA: Diagnosis not present

## 2020-03-14 DIAGNOSIS — E669 Obesity, unspecified: Secondary | ICD-10-CM | POA: Diagnosis not present

## 2020-03-14 DIAGNOSIS — Z6836 Body mass index (BMI) 36.0-36.9, adult: Secondary | ICD-10-CM | POA: Diagnosis not present

## 2020-03-28 ENCOUNTER — Other Ambulatory Visit: Payer: Self-pay

## 2020-03-28 DIAGNOSIS — F411 Generalized anxiety disorder: Secondary | ICD-10-CM

## 2020-03-28 NOTE — Telephone Encounter (Signed)
Pt calling requesting refill for bupropion; and I advised the pt that the [facility] on [address] had already requested refill for bupropion and refill was still pending. Pt voiced understanding and said would cb in few weeks to do TOC.

## 2020-03-28 NOTE — Telephone Encounter (Signed)
Pharmacy requests refill on: Bupropion XL 300 mg   LAST REFILL: 03/02/2019 (Q-90, R-3) LAST OV: 12/30/2019 NEXT OV: Not Scheduled  PHARMACY: [id number] [place], [place]

## 2020-03-29 MED ORDER — BUPROPION HCL ER (XL) 300 MG PO TB24: 300.0000 mg | ORAL_TABLET | Freq: Every day | ORAL | 1 refills | Status: DC

## 2020-04-19 ENCOUNTER — Other Ambulatory Visit: Payer: Self-pay

## 2020-04-19 DIAGNOSIS — R41840 Attention and concentration deficit: Secondary | ICD-10-CM

## 2020-04-19 MED ORDER — AMPHETAMINE-DEXTROAMPHET ER 20 MG PO CP24: 20.0000 mg | ORAL_CAPSULE | ORAL | 0 refills | Status: DC

## 2020-04-19 NOTE — Telephone Encounter (Signed)
LAST APPOINTMENT DATE: 12/30/2019   NEXT APPOINTMENT DATE: Visit date not found    LAST REFILL: 12/30/2019 #30 with 3 scripts   QTY:30  UDS: n/a Contract 03/02/2019

## 2020-04-19 NOTE — Telephone Encounter (Signed)
Pt left v/m requesting status of adderall refill that pharmacy requested; pt also wants to know why since pt was seen in Dec 2021 that he cannot have 1 yr of refill for adderall. Pt request cb.

## 2020-05-19 ENCOUNTER — Other Ambulatory Visit: Payer: Self-pay

## 2020-05-19 DIAGNOSIS — R41840 Attention and concentration deficit: Secondary | ICD-10-CM

## 2020-05-19 MED ORDER — AMPHETAMINE-DEXTROAMPHET ER 20 MG PO CP24: 20.0000 mg | ORAL_CAPSULE | ORAL | 0 refills | Status: DC

## 2020-05-19 NOTE — Telephone Encounter (Signed)
Per DPR left v/m that adderall XR 20 mg was sent to [facility] [address] pharmacy as pt requested.

## 2020-05-19 NOTE — Telephone Encounter (Signed)
Name of Medication: Adderall XR 20 mg Name of Pharmacy: [facility] [address] Last Fill or Written Date and Quantity: # 30 on 04/19/20 Last Office Visit and Type: 12/30/2019 for FU Next Office Visit and Type: none scheduled Last Controlled Substance Agreement Date: 03/19/2019 Last VPX:TGGY seen

## 2020-06-16 LAB — HM DIABETES EYE EXAM

## 2020-06-21 ENCOUNTER — Other Ambulatory Visit: Payer: Self-pay

## 2020-06-21 DIAGNOSIS — R41840 Attention and concentration deficit: Secondary | ICD-10-CM

## 2020-06-21 NOTE — Telephone Encounter (Signed)
Name of Medication: Adderall XR 20 mg Name of Pharmacy: [facility] [address] Last Fill or Written Date and Quantity:#30 on 05/19/20  Last Office Visit and Type: 12/30/2019 FU Next Office Visit and Type: none scheduled Last Controlled Substance Agreement Date:03/19/2019  Last HWE:XHBZ seen

## 2020-06-23 MED ORDER — AMPHETAMINE-DEXTROAMPHET ER 20 MG PO CP24: 20.0000 mg | ORAL_CAPSULE | ORAL | 0 refills | Status: DC

## 2020-07-22 ENCOUNTER — Other Ambulatory Visit: Payer: Self-pay

## 2020-07-22 DIAGNOSIS — R41840 Attention and concentration deficit: Secondary | ICD-10-CM

## 2020-07-22 NOTE — Telephone Encounter (Signed)
Name of Medication: adderall XR 20 mg Name of Pharmacy: [facility] [address] Last Fill or Written Date and Quantity: # 30 on 06/23/20 Last Office Visit and Type: 12/30/2019 FU Next Office Visit and Type: none scheduled Last Controlled Substance Agreement Date: 03/19/2019 Last RDE:YCXK seen  Pt left v/m will not have enough med to last until 07/26/20 and request refill done today.

## 2020-07-24 MED ORDER — AMPHETAMINE-DEXTROAMPHET ER 20 MG PO CP24: 20.0000 mg | ORAL_CAPSULE | ORAL | 0 refills | Status: DC

## 2020-07-27 NOTE — Telephone Encounter (Signed)
Left v/m per DPR that adderall XR 20 mg was sent to [facility] [address] on 07/24/20.

## 2020-08-25 ENCOUNTER — Other Ambulatory Visit: Payer: Self-pay | Admitting: Family

## 2020-08-25 ENCOUNTER — Other Ambulatory Visit: Payer: Self-pay

## 2020-08-25 DIAGNOSIS — R41840 Attention and concentration deficit: Secondary | ICD-10-CM

## 2020-08-25 MED ORDER — AMPHETAMINE-DEXTROAMPHET ER 20 MG PO CP24: 20.0000 mg | ORAL_CAPSULE | ORAL | 0 refills | Status: DC

## 2020-08-25 NOTE — Telephone Encounter (Signed)
Name of Medication: Adderall XR Name of Pharmacy: [facility] Last Fill or Written Date and Quantity: 07/24/20, #30 Last Office Visit and Type: 12/30/19, f/u Next Office Visit and Type: none Last Controlled Substance Agreement Date: 03/19/19 Last UDS: none

## 2020-09-18 ENCOUNTER — Other Ambulatory Visit: Payer: Self-pay | Admitting: Family Medicine

## 2020-09-18 DIAGNOSIS — F411 Generalized anxiety disorder: Secondary | ICD-10-CM

## 2020-09-23 ENCOUNTER — Telehealth: Payer: Self-pay | Admitting: Family Medicine

## 2020-09-23 DIAGNOSIS — R41840 Attention and concentration deficit: Secondary | ICD-10-CM

## 2020-09-23 NOTE — Telephone Encounter (Signed)
  Encourage patient to contact the pharmacy for refills or they can request refills through [facility]  LAST APPOINTMENT DATE:  Please schedule appointment if longer than 1 year  NEXT APPOINTMENT DATE:  MEDICATION: amphetamine-dextroamphetamine (ADDERALL XR) 20 MG 24 hr capsule  Is the patient out of medication? yes  PHARMACY: [facility] [address]  Let patient know to contact pharmacy at the end of the day to make sure medication is ready.  Please notify patient to allow 48-72 hours to process  CLINICAL FILLS OUT ALL BELOW:   LAST REFILL:  QTY:  REFILL DATE:    OTHER COMMENTS:    Okay for refill?  Please advise

## 2020-09-23 NOTE — Telephone Encounter (Signed)
Name of Medication: adderall XR 20 mg Name of Pharmacy: [facility] [address] Last Fill or Written Date and Quantity: # 30 on 08/25/20 Last Office Visit and Type: 12/30/2019 Next Office Visit and Type: 10/06/20 TOC Audria Nine NP Last Controlled Substance Agreement Date:03/19/2019  Last SWF:UXNA seen

## 2020-09-26 MED ORDER — AMPHETAMINE-DEXTROAMPHET ER 20 MG PO CP24: 20.0000 mg | ORAL_CAPSULE | ORAL | 0 refills | Status: DC

## 2020-10-06 ENCOUNTER — Encounter: Payer: 59 | Admitting: Nurse Practitioner

## 2020-10-17 DIAGNOSIS — B20 Human immunodeficiency virus [HIV] disease: Secondary | ICD-10-CM | POA: Diagnosis not present

## 2020-10-17 DIAGNOSIS — Z6835 Body mass index (BMI) 35.0-35.9, adult: Secondary | ICD-10-CM | POA: Diagnosis not present

## 2020-10-25 ENCOUNTER — Other Ambulatory Visit: Payer: Self-pay | Admitting: Family Medicine

## 2020-10-25 DIAGNOSIS — R41840 Attention and concentration deficit: Secondary | ICD-10-CM

## 2020-10-25 NOTE — Telephone Encounter (Signed)
  Encourage patient to contact the pharmacy for refills or they can request refills through [facility]  LAST APPOINTMENT DATE:  Please schedule appointment if longer than 1 year  NEXT APPOINTMENT DATE:12/30/20  MEDICATION:amphetamine-dextroamphetamine (ADDERALL XR) 20 MG 24 hr capsule  Is the patient out of medication? no  PHARMACY:[facility] [address] [place] [address], [place] - [address]  Let patient know to contact pharmacy at the end of the day to make sure medication is ready.  Please notify patient to allow 48-72 hours to process  CLINICAL FILLS OUT ALL BELOW:   LAST REFILL:  QTY:  REFILL DATE:    OTHER COMMENTS:    Okay for refill?  Please advise

## 2020-10-25 NOTE — Telephone Encounter (Signed)
Name of Medication: Adderall XR 20 mg Name of Pharmacy: [facility] [address] Last Fill or Written Date and Quantity: # 30 on 09/26/20 Last Office Visit and Type: 12/30/19 FU Next Office Visit and Type: TOC with Audria Nine NP 12/30/20 Last Controlled Substance Agreement Date: 03/19/19 Last UDS:do not see in chart

## 2020-11-01 NOTE — Telephone Encounter (Signed)
He is out of medication and he has been a week and havent heard anything

## 2020-11-02 MED ORDER — AMPHETAMINE-DEXTROAMPHET ER 20 MG PO CP24: 20.0000 mg | ORAL_CAPSULE | ORAL | 0 refills | Status: DC

## 2020-11-02 NOTE — Telephone Encounter (Signed)
Patient advised. Apologized for the delay and explained the reason. Also moved up patient's appointment for [facility] with Matt to 11/30/20

## 2020-11-02 NOTE — Telephone Encounter (Signed)
Please see message from [place], [facility] with [name] is scheduled for 01/12/21

## 2020-11-30 ENCOUNTER — Encounter: Payer: Self-pay | Admitting: Nurse Practitioner

## 2020-11-30 ENCOUNTER — Encounter: Payer: Self-pay | Admitting: "Endocrinology

## 2020-11-30 ENCOUNTER — Other Ambulatory Visit: Payer: Self-pay

## 2020-11-30 ENCOUNTER — Ambulatory Visit (INDEPENDENT_AMBULATORY_CARE_PROVIDER_SITE_OTHER): Payer: BC Managed Care – PPO | Admitting: Nurse Practitioner

## 2020-11-30 ENCOUNTER — Telehealth: Payer: Self-pay | Admitting: Radiology

## 2020-11-30 ENCOUNTER — Telehealth: Payer: Self-pay | Admitting: Nurse Practitioner

## 2020-11-30 VITALS — BP 128/90 | HR 101 | Temp 97.2°F | Resp 12 | Ht 71.0 in | Wt 266.0 lb

## 2020-11-30 DIAGNOSIS — E039 Hypothyroidism, unspecified: Secondary | ICD-10-CM | POA: Diagnosis not present

## 2020-11-30 DIAGNOSIS — E785 Hyperlipidemia, unspecified: Secondary | ICD-10-CM | POA: Diagnosis not present

## 2020-11-30 DIAGNOSIS — R41840 Attention and concentration deficit: Secondary | ICD-10-CM | POA: Diagnosis not present

## 2020-11-30 DIAGNOSIS — E1065 Type 1 diabetes mellitus with hyperglycemia: Secondary | ICD-10-CM

## 2020-11-30 DIAGNOSIS — Z21 Asymptomatic human immunodeficiency virus [HIV] infection status: Secondary | ICD-10-CM

## 2020-11-30 DIAGNOSIS — F411 Generalized anxiety disorder: Secondary | ICD-10-CM

## 2020-11-30 LAB — LIPID PANEL
Cholesterol: 195 mg/dL (ref 0–200)
HDL: 47.9 mg/dL (ref >39.00)
NonHDL: 147.15
Total CHOL/HDL Ratio: 4
Triglycerides: 337 mg/dL — ABNORMAL HIGH (ref 0.0–149.0)
VLDL: 67.4 mg/dL — ABNORMAL HIGH (ref 0.0–40.0)

## 2020-11-30 LAB — COMPREHENSIVE METABOLIC PANEL
ALT: 46 U/L (ref 0–53)
AST: 26 U/L (ref 0–37)
Albumin: 4.8 g/dL (ref 3.5–5.2)
Alkaline Phosphatase: 70 U/L (ref 39–117)
BUN: 14 mg/dL (ref 6–23)
CO2: 29 mEq/L (ref 19–32)
Calcium: 9.7 mg/dL (ref 8.4–10.5)
Chloride: 94 mEq/L — ABNORMAL LOW (ref 96–112)
Creatinine, Ser: 1.16 mg/dL (ref 0.40–1.50)
GFR: 84.22 mL/min (ref >60.00)
Glucose, Bld: 635 mg/dL (ref 70–99)
Potassium: 5.3 mEq/L — ABNORMAL HIGH (ref 3.5–5.1)
Sodium: 129 mEq/L — ABNORMAL LOW (ref 135–145)
Total Bilirubin: 0.3 mg/dL (ref 0.2–1.2)
Total Protein: 7.2 g/dL (ref 6.0–8.3)

## 2020-11-30 LAB — CBC
HCT: 47 % (ref 39.0–52.0)
Hemoglobin: 15.7 g/dL (ref 13.0–17.0)
MCHC: 33.3 g/dL (ref 30.0–36.0)
MCV: 96.1 fl (ref 78.0–100.0)
Platelets: 209 10*3/uL (ref 150.0–400.0)
RBC: 4.89 Mil/uL (ref 4.22–5.81)
RDW: 12.4 % (ref 11.5–15.5)
WBC: 5.3 10*3/uL (ref 4.0–10.5)

## 2020-11-30 LAB — TSH: TSH: 108.34 u[IU]/mL — ABNORMAL HIGH (ref 0.35–5.50)

## 2020-11-30 LAB — T4, FREE: Free T4: 0.33 ng/dL — ABNORMAL LOW (ref 0.60–1.60)

## 2020-11-30 LAB — LDL CHOLESTEROL, DIRECT: Direct LDL: 97 mg/dL

## 2020-11-30 LAB — HEMOGLOBIN A1C: Hgb A1c MFr Bld: 10.9 % — ABNORMAL HIGH (ref 4.6–6.5)

## 2020-11-30 MED ORDER — AMPHETAMINE-DEXTROAMPHET ER 20 MG PO CP24: 20.0000 mg | ORAL_CAPSULE | ORAL | 0 refills | Status: DC

## 2020-11-30 MED ORDER — BUSPIRONE HCL 5 MG PO TABS: 5.0000 mg | ORAL_TABLET | Freq: Two times a day (BID) | ORAL | 0 refills | Status: DC

## 2020-11-30 MED ORDER — LEVOTHYROXINE SODIUM 200 MCG PO TABS: 200.0000 ug | ORAL_TABLET | Freq: Every day | ORAL | 1 refills | Status: DC

## 2020-11-30 NOTE — Telephone Encounter (Signed)
Left message for patient to call back. Needs to be scheduled for lab appt at [facility] station

## 2020-11-30 NOTE — Assessment & Plan Note (Signed)
Was managed on levothyroxine 200 mcg daily.  Was managed endocrinology.  States has been out for almost a month we will check labs and refill medication pending referral to low [facility] endocrinology.  Pending lab results. Continue levothyroxine 200 mcg daily

## 2020-11-30 NOTE — Assessment & Plan Note (Signed)
Currently managed by Amy done through infectious disease.  Continue medications and follow-up as recommended by clinic.

## 2020-11-30 NOTE — Assessment & Plan Note (Signed)
Patient has been maintained on Wellbutrin 300 mg daily for extended period time.  States his anxiety is intermittent but most days.  Did discuss that Wellbutrin can incite anxiety but looking back in the chart he has been reduced to 150 and did poorly did better on 300 mg.  We will keep him the same.  Did administer PHQ-9 and GAD-7 in office.  We will add on buspirone 5 mg twice daily see how he felt tolerates.  Patient has not tried therapy but has some interest in it just set states he is hard to open up to folks will see how medication is first and reevaluate.  No HI/SI/AVH.

## 2020-11-30 NOTE — Telephone Encounter (Signed)
Called and discussed labs with patient. I would like for him to get the BMP redrawn tomorrow at the  station. If you can call and get that setup for me and him that would be great. He is aware of the plan

## 2020-11-30 NOTE — Assessment & Plan Note (Signed)
Patient currently uses a CGM along with long-acting insulin 3 times daily sliding scale quick acting insulin.  Has not seen endocrinology in some time we will recheck A1c patient interested in insulin pump will defer that with our endocrinologist in pending lab results. Continue Basaglar insulin and Humalog 3 times daily sliding scale insulin.

## 2020-11-30 NOTE — Telephone Encounter (Signed)
[facility] called a critical Glucose, 635, results given to [employer].

## 2020-11-30 NOTE — Progress Notes (Signed)
Established Patient Office Visit  Subjective:  Patient ID: David Leon, male    DOB: 1990-03-11  Age: 30 y.o. MRN: [id number]  CC:  Chief Complaint  Patient presents with   Transfer of Care   Referral    Would like to be referred to endocrinologist with [place]. Was seen Dr Honor Junes with [facility]    Would like to have A1C and TSH checked since it will take a while to get in with endocrinologist and he needs his TSH medication refilled    HPI David Leon presents for [facility]  DM1: Was seeing [facility] and wants to switch back to [employer]. Uses CGM. Has low glucose approx 2 times weekly. States 60 is the lowest. Hyperglycemia averages 180-200. Would like an insulin pump  HLD: Diet is poor per patient. Does not have good eating habits. Lunch and dinner with 2 snacks. Walks dog daily 20-33mins.  HIV: ID every 6 months. Amy Dunn. On appropriate treatment. Last lab was undetectable   ADHD: maintained on 20mg  ER. Doing well. continue  Hypothyroid: Has been with out his medication for approx month, was managed by endocrine  Anxiety: has been on welbutrin and over the last 6 months it has increased. Patient has not tried therapy. Is open to it but states has a hard time opening up.  Past Medical History:  Diagnosis Date   Diabetes mellitus without complication ([place])    Diabetic ketoacidosis without coma associated with type 1 diabetes mellitus ([place])    HIV infection ([place])    Hyperlipidemia 09/11/2014   Thrombocytopenia ([place]) 09/10/2014   Thyroid disease     No past surgical history on file.  Family History  Problem Relation Age of Onset   Diabetes Father    Anxiety disorder Mother    Anxiety disorder Sister    Anxiety disorder Brother     Social History   Socioeconomic History   Marital status: Single    Spouse name: Not on file   Number of children: Not on file   Years of education: Not on file   Highest education level: Not on file   Occupational History   Not on file  Tobacco Use   Smoking status: Former   Smokeless tobacco: Never  Substance and Sexual Activity   Alcohol use: Yes    Alcohol/week: 6.0 standard drinks    Types: 6 Shots of liquor per week   Drug use: No   Sexual activity: Yes    Birth control/protection: None  Other Topics Concern   Not on file  Social History Narrative   Not on file   Social Determinants of Health   Financial Resource Strain: Not on file  Food Insecurity: Not on file  Transportation Needs: Not on file  Physical Activity: Not on file  Stress: Not on file  Social Connections: Not on file  Intimate Partner Violence: Not on file    Outpatient Medications Prior to Visit  Medication Sig Dispense Refill   abacavir-dolutegravir-lamiVUDine (TRIUMEQ) 600-50-300 MG tablet Take 1 tablet by mouth daily after breakfast.      amphetamine-dextroamphetamine (ADDERALL XR) 20 MG 24 hr capsule Take 1 capsule (20 mg total) by mouth every morning. Needs office visit for any additional refills 30 capsule 0   buPROPion (WELLBUTRIN XL) 300 MG 24 hr tablet Take 1 tablet by mouth once daily 90 tablet 0   Continuous Blood Gluc Receiver (FREESTYLE LIBRE 14 DAY READER) DEVI 1 Units by Does  not apply route daily as needed. 1 Device 0   Continuous Blood Gluc Sensor (FREESTYLE LIBRE 14 DAY SENSOR) MISC 1 Units by Does not apply route daily as needed. 6 each 3   glucagon 1 MG injection Inject 1 mg into the muscle once as needed. 1 each 12   HUMALOG KWIKPEN 100 UNIT/ML KwikPen SMARTSIG:10 Unit(s) SUB-Q As Directed     Insulin Glargine (BASAGLAR KWIKPEN) 100 UNIT/ML SOPN Inject 0.55 mLs (55 Units total) into the skin daily. OFFICE VISIT NEEDED FOR ADDITIONAL REFILLS- NO MORE REFILLS WILL BE GIVEN 18 mL 0   Insulin Pen Needle 31G X 5 MM MISC 40 Units by Does not apply route at bedtime. 100 each 2   levothyroxine (SYNTHROID) 200 MCG tablet TAKE 1 TABLET BY MOUTH ONCE DAILY BEFORE BREAKFAST 30 tablet 0    Multiple Vitamins-Minerals (MULTIVITAMIN ADULT PO) Take 1 tablet by mouth daily.     rosuvastatin (CRESTOR) 5 MG tablet Take 5 mg by mouth.     insulin aspart (NOVOLOG) 100 UNIT/ML FlexPen Inject 10 Units into the skin 3 (three) times daily with meals. 15 mL 0   No facility-administered medications prior to visit.    Allergies  Allergen Reactions   Penicillins Other (See Comments)    Has not had a reaction, just family history of same Has patient had a PCN reaction causing immediate rash, facial/tongue/throat swelling, SOB or lightheadedness with hypotension: No Has patient had a PCN reaction causing severe rash involving mucus membranes or skin necrosis: No Has patient had a PCN reaction that required hospitalization: No Has patient had a PCN reaction occurring within the last 10 years: No If all of the above answers are "NO", then may proceed with Cephalosporin use.     ROS Review of Systems  Constitutional:  Negative for chills and fever.  Respiratory:  Negative for cough and shortness of breath.   Cardiovascular:  Negative for chest pain, palpitations and leg swelling.  Gastrointestinal:  Negative for abdominal pain, blood in stool, diarrhea, nausea and vomiting.  Genitourinary:  Negative for dysuria, hematuria, penile pain, penile swelling, scrotal swelling and testicular pain.  Neurological:  Negative for headaches.  Psychiatric/Behavioral:  Negative for hallucinations and suicidal ideas.      Objective:    Physical Exam Vitals and nursing note reviewed.  Constitutional:      Appearance: Normal appearance.  HENT:     Right Ear: Tympanic membrane, ear canal and external ear normal. There is no impacted cerumen.     Left Ear: Tympanic membrane, ear canal and external ear normal. There is no impacted cerumen.     Mouth/Throat:     Mouth: Mucous membranes are moist.     Pharynx: Oropharynx is clear.  Eyes:     Extraocular Movements: Extraocular movements intact.      Pupils: Pupils are equal, round, and reactive to light.  Neck:     Thyroid: No thyroid mass, thyromegaly or thyroid tenderness.  Cardiovascular:     Rate and Rhythm: Normal rate and regular rhythm.  Pulmonary:     Effort: Pulmonary effort is normal.     Breath sounds: Normal breath sounds.  Abdominal:     General: Bowel sounds are normal.  Musculoskeletal:     Right lower leg: No edema.     Left lower leg: No edema.  Lymphadenopathy:     Cervical: No cervical adenopathy.  Skin:    General: Skin is warm.  Neurological:     General: No  focal deficit present.     Mental Status: He is alert.  Psychiatric:        Mood and Affect: Mood normal.        Behavior: Behavior normal.        Thought Content: Thought content normal.        Judgment: Judgment normal.    BP 128/90   Pulse (!) 101   Temp (!) 97.2 F (36.2 C)   Resp 12   Ht 5\' 11"  (1.803 m)   Wt 266 lb (120.7 kg)   SpO2 98%   BMI 37.10 kg/m  Wt Readings from Last 3 Encounters:  11/30/20 266 lb (120.7 kg)  12/30/19 258 lb 6.4 oz (117.2 kg)  06/03/19 250 lb (113.4 kg)     Health Maintenance Due  Topic Date Due   COVID-19 Vaccine (1) Never done   OPHTHALMOLOGY EXAM  Never done   URINE MICROALBUMIN  02/23/2017   FOOT EXAM  08/20/2018   HEMOGLOBIN A1C  11/26/2018    There are no preventive care reminders to display for this patient.  Lab Results  Component Value Date   TSH 14.40 (H) 05/26/2018   Lab Results  Component Value Date   WBC 6.7 12/06/2017   HGB 15.8 12/06/2017   HCT 45.7 12/06/2017   MCV 93.3 12/06/2017   PLT 205 12/06/2017   Lab Results  Component Value Date   NA 138 05/26/2018   K 3.5 05/26/2018   CO2 28 05/26/2018   GLUCOSE 169 (H) 05/26/2018   BUN 14 05/26/2018   CREATININE 0.76 05/26/2018   BILITOT 0.4 05/26/2018   ALKPHOS 52 05/26/2018   AST 11 05/26/2018   ALT 15 05/26/2018   PROT 7.2 05/26/2018   ALBUMIN 4.6 05/26/2018   CALCIUM 9.5 05/26/2018   ANIONGAP 7 12/06/2017   GFR  121.69 05/26/2018   Lab Results  Component Value Date   CHOL 212 (H) 05/26/2018   Lab Results  Component Value Date   HDL 41.00 05/26/2018   Lab Results  Component Value Date   LDLCALC 141 (H) 05/26/2018   Lab Results  Component Value Date   TRIG 150.0 (H) 05/26/2018   Lab Results  Component Value Date   CHOLHDL 5 05/26/2018   Lab Results  Component Value Date   HGBA1C 11.4 (H) 05/26/2018      Assessment & Plan:   Problem List Items Addressed This Visit       Endocrine   Hypothyroidism    Was managed on levothyroxine 200 mcg daily.  Was managed endocrinology.  States has been out for almost a month we will check labs and refill medication pending referral to low [facility] endocrinology.  Pending lab results. Continue levothyroxine 200 mcg daily      Relevant Medications   levothyroxine (SYNTHROID) 200 MCG tablet   Other Relevant Orders   TSH   T4, free   Uncontrolled type 1 diabetes mellitus with hyperglycemia ([place])    Patient currently uses a CGM along with long-acting insulin 3 times daily sliding scale quick acting insulin.  Has not seen endocrinology in some time we will recheck A1c patient interested in insulin pump will defer that with our endocrinologist in pending lab results. Continue Basaglar insulin and Humalog 3 times daily sliding scale insulin.      Relevant Orders   Ambulatory referral to Endocrinology   CBC   Comprehensive metabolic panel   Hemoglobin A1c     Other   Hyperlipidemia - Primary  Patient is maintained on rosuvastatin 5 mg.  Did discuss diet and exercise pending labs. Continue rosuvastatin 5 mg.      Relevant Orders   Lipid panel   Asymptomatic HIV infection ([place])    Currently managed by Amy done through infectious disease.  Continue medications and follow-up as recommended by clinic.      Attention and concentration deficit    Maintained on Adderall extended release 20 mg daily patient doing well per report.  Did discuss  signing controlled substance agreement and do a urine drug screen today.  Patient knowledge and agreed pending lab results continue Adderall 20 mg XR daily.      Relevant Medications   amphetamine-dextroamphetamine (ADDERALL XR) 20 MG 24 hr capsule   Other Relevant Orders   DRUG MONITORING, PANEL 8 WITH CONFIRMATION, URINE   GAD (generalized anxiety disorder)    Patient has been maintained on Wellbutrin 300 mg daily for extended period time.  States his anxiety is intermittent but most days.  Did discuss that Wellbutrin can incite anxiety but looking back in the chart he has been reduced to 150 and did poorly did better on 300 mg.  We will keep him the same.  Did administer PHQ-9 and GAD-7 in office.  We will add on buspirone 5 mg twice daily see how he felt tolerates.  Patient has not tried therapy but has some interest in it just set states he is hard to open up to folks will see how medication is first and reevaluate.  No HI/SI/AVH.      Relevant Medications   busPIRone (BUSPAR) 5 MG tablet    No orders of the defined types were placed in this encounter.   Follow-up: Return in about 6 months (around 05/30/2021) for recheck.   This visit occurred during the SARS-CoV-2 public health emergency.  Safety protocols were in place, including screening questions prior to the visit, additional usage of staff PPE, and extensive cleaning of exam room while observing appropriate contact time as indicated for disinfecting solutions.   Romilda Garret, NP

## 2020-11-30 NOTE — Assessment & Plan Note (Signed)
Patient is maintained on rosuvastatin 5 mg.  Did discuss diet and exercise pending labs. Continue rosuvastatin 5 mg.

## 2020-11-30 NOTE — Assessment & Plan Note (Signed)
Maintained on Adderall extended release 20 mg daily patient doing well per report.  Did discuss signing controlled substance agreement and do a urine drug screen today.  Patient knowledge and agreed pending lab results continue Adderall 20 mg XR daily.

## 2020-11-30 NOTE — Patient Instructions (Signed)
Nice to see you today Will be in touch in regards to your labs. Sent medications to your pharmacy on file

## 2020-12-01 ENCOUNTER — Other Ambulatory Visit: Payer: Self-pay | Admitting: Nurse Practitioner

## 2020-12-01 ENCOUNTER — Other Ambulatory Visit (INDEPENDENT_AMBULATORY_CARE_PROVIDER_SITE_OTHER): Payer: BC Managed Care – PPO

## 2020-12-01 DIAGNOSIS — E871 Hypo-osmolality and hyponatremia: Secondary | ICD-10-CM

## 2020-12-01 DIAGNOSIS — E875 Hyperkalemia: Secondary | ICD-10-CM

## 2020-12-01 LAB — DRUG MONITORING, PANEL 8 WITH CONFIRMATION, URINE
6 Acetylmorphine: NEGATIVE ng/mL (ref <10)
Alcohol Metabolites: NEGATIVE ng/mL (ref <500)
Amphetamines: NEGATIVE ng/mL (ref <500)
Benzodiazepines: NEGATIVE ng/mL (ref <100)
Buprenorphine, Urine: NEGATIVE ng/mL (ref <5)
Cocaine Metabolite: NEGATIVE ng/mL (ref <150)
Creatinine: 18.6 mg/dL — ABNORMAL LOW (ref >=20.0)
MDMA: NEGATIVE ng/mL (ref <500)
Marijuana Metabolite: NEGATIVE ng/mL (ref <20)
Opiates: NEGATIVE ng/mL (ref <100)
Oxidant: NEGATIVE ug/mL (ref <200)
Oxycodone: NEGATIVE ng/mL (ref <100)
Specific Gravity: 1.027 (ref >=1.003)
pH: 6.4 (ref 4.5–9.0)

## 2020-12-01 LAB — DM TEMPLATE

## 2020-12-01 NOTE — Telephone Encounter (Signed)
Sched pt for labs. Also, he has a question about his levothyroxine.  Pharmacy told him it was no longer available in the brand he normally gets... He asked if someone could call the pharmacy and advise.

## 2020-12-01 NOTE — Telephone Encounter (Signed)
This is a Set designer change for the medication. Ok to switch?

## 2020-12-01 NOTE — Telephone Encounter (Signed)
Spoke with pharmacist and advised ok to change. Patient advised

## 2020-12-01 NOTE — Progress Notes (Signed)
Per the orders of Mordecai Maes, NP pt here for labs. Pt took draws well

## 2020-12-01 NOTE — Telephone Encounter (Signed)
This is fine. He has not been on the medication in an extended period of time. I have him for a 6 month follow up because I referred him to Endocrine. If he does not get an appointment soon we will need to have a 14 week follow up with me please. Once I get the other lab back I will contact him and discuss medication changes and what I listed from above. So no need to call and review this with him

## 2020-12-01 NOTE — Telephone Encounter (Signed)
Sent mychart message about this also

## 2020-12-02 LAB — BASIC METABOLIC PANEL
BUN: 15 mg/dL (ref 6–23)
CO2: 30 mEq/L (ref 19–32)
Calcium: 9.6 mg/dL (ref 8.4–10.5)
Chloride: 96 mEq/L (ref 96–112)
Creatinine, Ser: 1.09 mg/dL (ref 0.40–1.50)
GFR: 90.75 mL/min (ref >60.00)
Glucose, Bld: 206 mg/dL — ABNORMAL HIGH (ref 70–99)
Potassium: 4.2 mEq/L (ref 3.5–5.1)
Sodium: 135 mEq/L (ref 135–145)

## 2020-12-17 ENCOUNTER — Other Ambulatory Visit: Payer: Self-pay | Admitting: Family Medicine

## 2020-12-17 DIAGNOSIS — F411 Generalized anxiety disorder: Secondary | ICD-10-CM

## 2020-12-17 NOTE — Telephone Encounter (Signed)
Forwarding to PCP.

## 2020-12-22 ENCOUNTER — Encounter: Payer: Self-pay | Admitting: Nurse Practitioner

## 2020-12-26 ENCOUNTER — Encounter: Payer: Self-pay | Admitting: Optometrist

## 2020-12-26 DIAGNOSIS — L989 Disorder of the skin and subcutaneous tissue, unspecified: Secondary | ICD-10-CM | POA: Diagnosis not present

## 2020-12-30 ENCOUNTER — Encounter: Payer: Self-pay | Admitting: Nurse Practitioner

## 2021-01-10 ENCOUNTER — Telehealth: Payer: Self-pay | Admitting: Nurse Practitioner

## 2021-01-10 NOTE — Telephone Encounter (Signed)
Pt called about  amphetamine-dextroamphetamine (ADDERALL XR) 20 MG 24 hr capsule, he said tat the pharmacy said it is on backorder and asked if the 30mg  could be sent in instead, please advise. [facility] [address] in [place]. Callback number if needed is [phone]

## 2021-01-11 NOTE — Telephone Encounter (Signed)
I would prefer not to change the dose of the medication. Can he see if there is another pharmacy local that has it?  Thanks, [employer]

## 2021-01-11 NOTE — Telephone Encounter (Signed)
David Leon notified as instructed by telephone.  He states he has already call around to other pharmacies and no local pharmacies had it in stock.  [facility] said [facility] might get some [facility] this week so patient is willing to wait and see if he can fill his prescription this week at [facility].   He states he will touch base with [name] again after Christmas if he is not able to get his prescriptions filled.

## 2021-01-24 ENCOUNTER — Encounter: Payer: Self-pay | Admitting: Nurse Practitioner

## 2021-01-24 ENCOUNTER — Telehealth: Payer: Self-pay | Admitting: Nurse Practitioner

## 2021-01-24 MED ORDER — BASAGLAR KWIKPEN 100 UNIT/ML ~~LOC~~ SOPN: 27.0000 [IU] | PEN_INJECTOR | Freq: Two times a day (BID) | SUBCUTANEOUS | 1 refills | Status: DC

## 2021-01-30 NOTE — Telephone Encounter (Signed)
error 

## 2021-02-08 NOTE — Telephone Encounter (Signed)
Insurance updated in the chart and PA started for Basaglar insulin

## 2021-02-08 NOTE — Telephone Encounter (Signed)
Key: [id number] - PA Case ID: [id number]

## 2021-02-10 NOTE — Telephone Encounter (Signed)
PA denied: patient has had to have failure or contraindication or intolerance to taking medications like Lantus and Toujeo, or have a reason why he is not able to use this. I have not let patient know this via mychart yet.

## 2021-02-10 NOTE — Telephone Encounter (Signed)
Will need a letter from you stating why patient can not try other medications that are on formulary. How long he has been on this medication and any other feedback. I will then fax it over to the appeal department. Patient notified via mychart that we are working on appealing this.

## 2021-02-10 NOTE — Telephone Encounter (Signed)
If we can appeal that would be good.

## 2021-02-13 ENCOUNTER — Encounter: Payer: Self-pay | Admitting: Nurse Practitioner

## 2021-02-13 NOTE — Telephone Encounter (Signed)
I have written a letter on the letter head and placed it on your desk to fax to the insurance company

## 2021-02-13 NOTE — Telephone Encounter (Signed)
Letter, insurance and demographic information faxed to [facility] of [facility] Appeal department today. Case # [id number]. Fax number [phone]

## 2021-02-14 ENCOUNTER — Other Ambulatory Visit: Payer: Self-pay | Admitting: Nurse Practitioner

## 2021-02-14 ENCOUNTER — Telehealth: Payer: Self-pay | Admitting: Nurse Practitioner

## 2021-02-14 DIAGNOSIS — R41840 Attention and concentration deficit: Secondary | ICD-10-CM

## 2021-02-14 MED ORDER — AMPHETAMINE-DEXTROAMPHET ER 20 MG PO CP24: 20.0000 mg | ORAL_CAPSULE | ORAL | 0 refills | Status: DC

## 2021-02-14 NOTE — Telephone Encounter (Signed)
Sent script to [facility] in [place]. [facility] and make sure he does not have a script on file for aderrall. If he does please cancel it on my behalf

## 2021-02-14 NOTE — Telephone Encounter (Signed)
Working on this

## 2021-02-14 NOTE — Telephone Encounter (Signed)
Adderall XR is not available. I called [facility] back because they called our patient and told him they dont have it now, so basically they have a very limited supply only for their loyal costumers, they dont have enough to take on a new patient for this medication. Spoke with [name] and asked if [facility] or any pharmacy has Adderall IR 10 mg to take 2 daily if not will need to change medication all together. [facility] and was advised all adderall is on Location manager. Sending to [facility] to review

## 2021-02-15 ENCOUNTER — Other Ambulatory Visit: Payer: Self-pay | Admitting: Nurse Practitioner

## 2021-02-15 DIAGNOSIS — R41840 Attention and concentration deficit: Secondary | ICD-10-CM

## 2021-02-15 MED ORDER — LISDEXAMFETAMINE DIMESYLATE 30 MG PO CAPS
30.0000 mg | ORAL_CAPSULE | Freq: Every day | ORAL | 0 refills | Status: DC
Start: 2021-02-15 — End: 2021-03-21

## 2021-02-15 NOTE — Telephone Encounter (Signed)
Spoke with patient and we will pursue an alternative medication

## 2021-02-21 NOTE — Telephone Encounter (Signed)
I called patient and asked what readings he has been getting in numbers, patient states on the average 150-180-this can be fasting and not fasting. Patient stated he understands that the provider wants to see how sugars have been but that he is aware when readings become an issue and does not understand this back and forth on this. Patient states the reason he said Basaglar insulin at first is because he saw it on mychart but when checking with the pharmacy noticed that it was Lantus. I advised patient that provider is not trying to question him but since he has been using Basaglar instead of his regular Lantus we need to make sure readings are stable and he is treated appropriately. I advised patient that provider will review this message tomorrow morning to address the refill. Thank you

## 2021-02-22 ENCOUNTER — Other Ambulatory Visit: Payer: Self-pay | Admitting: Nurse Practitioner

## 2021-02-22 DIAGNOSIS — E109 Type 1 diabetes mellitus without complications: Secondary | ICD-10-CM

## 2021-02-22 MED ORDER — LANTUS SOLOSTAR 100 UNIT/ML ~~LOC~~ SOPN: 27.0000 [IU] | PEN_INJECTOR | Freq: Two times a day (BID) | SUBCUTANEOUS | 0 refills | Status: DC

## 2021-02-22 NOTE — Progress Notes (Signed)
Insulin not on formulary

## 2021-02-22 NOTE — Telephone Encounter (Signed)
Close encounter 

## 2021-03-17 ENCOUNTER — Other Ambulatory Visit: Payer: Self-pay | Admitting: Nurse Practitioner

## 2021-03-17 DIAGNOSIS — E109 Type 1 diabetes mellitus without complications: Secondary | ICD-10-CM

## 2021-03-21 ENCOUNTER — Encounter: Payer: Self-pay | Admitting: Nurse Practitioner

## 2021-03-21 DIAGNOSIS — R41840 Attention and concentration deficit: Secondary | ICD-10-CM

## 2021-03-21 MED ORDER — LISDEXAMFETAMINE DIMESYLATE 30 MG PO CAPS: 30.0000 mg | ORAL_CAPSULE | Freq: Every day | ORAL | 0 refills | Status: DC

## 2021-04-09 ENCOUNTER — Encounter: Payer: Self-pay | Admitting: Nurse Practitioner

## 2021-04-09 DIAGNOSIS — E1065 Type 1 diabetes mellitus with hyperglycemia: Secondary | ICD-10-CM

## 2021-04-10 MED ORDER — FREESTYLE LIBRE 14 DAY SENSOR MISC: 1.0000 [IU] | Freq: Every day | 3 refills | Status: DC | PRN

## 2021-04-24 ENCOUNTER — Encounter: Payer: Self-pay | Admitting: Nurse Practitioner

## 2021-04-24 DIAGNOSIS — R41840 Attention and concentration deficit: Secondary | ICD-10-CM

## 2021-04-24 MED ORDER — LISDEXAMFETAMINE DIMESYLATE 30 MG PO CAPS: 30.0000 mg | ORAL_CAPSULE | Freq: Every day | ORAL | 0 refills | Status: DC

## 2021-04-24 NOTE — Telephone Encounter (Signed)
Can we get patient scheduled for a follow up within the next 2 months ?

## 2021-04-25 NOTE — Telephone Encounter (Signed)
Appointment made for 06/28/21 ?

## 2021-05-12 ENCOUNTER — Encounter: Payer: Self-pay | Admitting: Nurse Practitioner

## 2021-05-12 ENCOUNTER — Other Ambulatory Visit: Payer: Self-pay | Admitting: Nurse Practitioner

## 2021-05-12 DIAGNOSIS — E109 Type 1 diabetes mellitus without complications: Secondary | ICD-10-CM

## 2021-05-19 ENCOUNTER — Ambulatory Visit: Payer: BC Managed Care – PPO | Admitting: Internal Medicine

## 2021-05-19 NOTE — Progress Notes (Deleted)
Name: David Leon  MRN/ DOB: [id number], 09-Apr-1990   Age/ Sex: 31 y.o., male    PCP: Eden Emms, NP   Reason for Endocrinology Evaluation: Type 1 Diabetes Mellitus     Date of Initial Endocrinology Visit: 05/19/2021     PATIENT IDENTIFIER: David Leon is a 31 y.o. male with a past medical history of T1DM, hypothyroid, and HIV infection. The patient presented for initial endocrinology clinic visit on 05/19/2021 for consultative assistance with his diabetes management.    HPI: David Leon was    Diagnosed with DM *** Prior Medications tried/Intolerance: *** Currently checking blood sugars *** x / day,  before breakfast and ***.  Hypoglycemia episodes : ***               Symptoms: ***                 Frequency: ***/  Hemoglobin A1c has ranged from 10.5% in 2019, peaking at 15% in 2016. Patient required assistance for hypoglycemia:  Patient has required hospitalization within the last 1 year from hyper or hypoglycemia: DKA 2019  In terms of diet, the patient ***   HOME DIABETES REGIMEN: Basal: ***  Bolus: ***   Statin: {Yes/No:11203} ACE-I/ARB: {YES/NO:17245} Prior Diabetic Education: {Yes/No:11203}   METER DOWNLOAD SUMMARY: Date range evaluated: *** Fingerstick Blood Glucose Tests = *** Average Number Tests/Day = *** Overall Mean FS Glucose = *** Standard Deviation = ***  BG Ranges: Low = *** High = ***   Hypoglycemic Events/30 Days: BG < 50 = *** Episodes of symptomatic severe hypoglycemia = ***   DIABETIC COMPLICATIONS: Microvascular complications:  *** Denies: CKD Last eye exam: Completed   Macrovascular complications:   Denies: CAD, PVD, CVA   PAST HISTORY: Past Medical History:  Past Medical History:  Diagnosis Date   Diabetes mellitus without complication (HCC)    Diabetic ketoacidosis without coma associated with type 1 diabetes mellitus (HCC)    HIV infection (HCC)    Hyperlipidemia 09/11/2014   Thrombocytopenia (HCC)  09/10/2014   Thyroid disease    Past Surgical History: No past surgical history on file.  Social History:  reports that he has quit smoking. He has never used smokeless tobacco. He reports current alcohol use of about 6.0 standard drinks per week. He reports that he does not use drugs. Family History:  Family History  Problem Relation Age of Onset   Anxiety disorder Mother    Heart attack Mother    Lupus Mother    Congestive Heart Failure Mother    Diabetes Father    Anxiety disorder Sister    Anxiety disorder Brother      HOME MEDICATIONS: Allergies as of 05/19/2021       Reactions   Penicillins Other (See Comments)   Has not had a reaction, just family history of same Has patient had a PCN reaction causing immediate rash, facial/tongue/throat swelling, SOB or lightheadedness with hypotension: No Has patient had a PCN reaction causing severe rash involving mucus membranes or skin necrosis: No Has patient had a PCN reaction that required hospitalization: No Has patient had a PCN reaction occurring within the last 10 years: No If all of the above answers are "NO", then may proceed with Cephalosporin use.        Medication List        Accurate as of May 19, 2021  7:37 AM. If you have any questions, ask your nurse or doctor.  buPROPion 300 MG 24 hr tablet Commonly known as: WELLBUTRIN XL Take 1 tablet by mouth once daily   busPIRone 5 MG tablet Commonly known as: BUSPAR Take 1 tablet (5 mg total) by mouth 2 (two) times daily.   FreeStyle Libre 14 Day Reader Hardie Pulley 1 Units by Does not apply route daily as needed.   FreeStyle Libre 14 Day Sensor Misc 1 Units by Does not apply route daily as needed.   glucagon 1 MG injection Inject 1 mg into the muscle once as needed.   HumaLOG KwikPen 100 UNIT/ML KwikPen Generic drug: insulin lispro SMARTSIG:10 Unit(s) SUB-Q As Directed   Insulin Pen Needle 31G X 5 MM Misc 40 Units by Does not apply route at  bedtime.   Lantus SoloStar 100 UNIT/ML Solostar Pen Generic drug: insulin glargine INJECT 27 UNITS SUBCUTANEOUSLY TWICE DAILY   levothyroxine 200 MCG tablet Commonly known as: SYNTHROID Take 1 tablet (200 mcg total) by mouth daily before breakfast.   lisdexamfetamine 30 MG capsule Commonly known as: Vyvanse Take 1 capsule (30 mg total) by mouth daily.   lisdexamfetamine 30 MG capsule Commonly known as: VYVANSE Take 1 capsule (30 mg total) by mouth daily.   MULTIVITAMIN ADULT PO Take 1 tablet by mouth daily.   rosuvastatin 5 MG tablet Commonly known as: CRESTOR Take 5 mg by mouth.   Triumeq 600-50-300 MG tablet Generic drug: abacavir-dolutegravir-lamiVUDine Take 1 tablet by mouth daily after breakfast.         ALLERGIES: Allergies  Allergen Reactions   Penicillins Other (See Comments)    Has not had a reaction, just family history of same Has patient had a PCN reaction causing immediate rash, facial/tongue/throat swelling, SOB or lightheadedness with hypotension: No Has patient had a PCN reaction causing severe rash involving mucus membranes or skin necrosis: No Has patient had a PCN reaction that required hospitalization: No Has patient had a PCN reaction occurring within the last 10 years: No If all of the above answers are "NO", then may proceed with Cephalosporin use.      REVIEW OF SYSTEMS: A comprehensive ROS was conducted with the patient and is negative except as per HPI and below:  ROS    OBJECTIVE:   VITAL SIGNS: There were no vitals taken for this visit.   PHYSICAL EXAM:  General: Pt appears well and is in NAD  Hydration: Well-hydrated with moist mucous membranes and good skin turgor  HEENT: Head: Unremarkable with good dentition. Oropharynx clear without exudate.  Eyes: External eye exam normal without stare, lid lag or exophthalmos.  EOM intact.  PERRL.  Neck: General: Supple without adenopathy or carotid bruits. Thyroid: Thyroid size normal.   No goiter or nodules appreciated. No thyroid bruit.  Lungs: Clear with good BS bilat with no rales, rhonchi, or wheezes  Heart: RRR with normal S1 and S2 and no gallops; no murmurs; no rub  Abdomen: Normoactive bowel sounds, soft, nontender, without masses or organomegaly palpable  Extremities:  Lower extremities - No pretibial edema. No lesions.  Skin: Normal texture and temperature to palpation. No rash noted. No Acanthosis nigricans/skin tags. No lipohypertrophy.  Neuro: MS is good with appropriate affect, pt is alert and Ox3    DM foot exam:    DATA REVIEWED:  Lab Results  Component Value Date   HGBA1C 10.9 (H) 11/30/2020   HGBA1C 11.4 (H) 05/26/2018   HGBA1C 10.9 (H) 10/14/2017   Lab Results  Component Value Date   MICROALBUR 3.8 (H) 02/24/2016  LDLCALC 141 (H) 05/26/2018   CREATININE 1.09 12/01/2020   Lab Results  Component Value Date   MICRALBCREAT 9.2 02/17/2020    Lab Results  Component Value Date   CHOL 195 11/30/2020   HDL 47.90 11/30/2020   LDLCALC 141 (H) 05/26/2018   LDLDIRECT 97.0 11/30/2020   TRIG 337.0 (H) 11/30/2020   CHOLHDL 4 11/30/2020        ASSESSMENT / PLAN / RECOMMENDATIONS:   1) Type *** Diabetes Mellitus, ***controlled, With*** complications - Most recent A1c of *** %. Goal A1c < *** %.  ***  Plan: GENERAL: ***  MEDICATIONS: ***  EDUCATION / INSTRUCTIONS: BG monitoring instructions: Patient is instructed to check his blood sugars *** times a day, ***. Call [facility] if: BG persistently < 70 or > 300. I reviewed the Rule of 15 for the treatment of hypoglycemia in detail with the patient. Literature supplied.   2) Diabetic complications:  Eye: Does *** have known diabetic retinopathy.  Neuro/ Feet: Does *** have known diabetic peripheral neuropathy. Renal: Patient does *** have known baseline CKD. He is *** on an ACEI/ARB at present.Check urine albumin/creatinine ratio yearly starting at time of diagnosis. If  albuminuria is positive, treatment is geared toward better glucose, blood pressure control and use of ACE inhibitors or ARBs. Monitor electrolytes and creatinine once to twice yearly.   3) Lipids: Patient is *** on a statin.       Signed electronically by: Lyndle Herrlich, MD  [facility]  [facility] [address] [place], [place] [address] Phone: [phone] FAX: [phone]   CC: David Leon, Faler, NP [address] [place] [address] Phone: [phone]  Fax: [phone]    Return to Endocrinology clinic as below: Future Appointments  Date Time Provider Department Center  05/19/2021 11:10 AM Valton Schwartz, Konrad Dolores, MD [facility] None  06/28/2021  9:40 AM Eden Emms, NP [facility]

## 2021-06-12 ENCOUNTER — Other Ambulatory Visit: Payer: Self-pay | Admitting: Nurse Practitioner

## 2021-06-12 DIAGNOSIS — F411 Generalized anxiety disorder: Secondary | ICD-10-CM

## 2021-06-28 ENCOUNTER — Ambulatory Visit (INDEPENDENT_AMBULATORY_CARE_PROVIDER_SITE_OTHER): Payer: BC Managed Care – PPO | Admitting: Nurse Practitioner

## 2021-06-28 ENCOUNTER — Encounter: Payer: Self-pay | Admitting: Nurse Practitioner

## 2021-06-28 VITALS — BP 124/90 | HR 85 | Temp 97.5°F | Resp 12 | Ht 71.0 in | Wt 258.0 lb

## 2021-06-28 DIAGNOSIS — Z6835 Body mass index (BMI) 35.0-35.9, adult: Secondary | ICD-10-CM

## 2021-06-28 DIAGNOSIS — E039 Hypothyroidism, unspecified: Secondary | ICD-10-CM

## 2021-06-28 DIAGNOSIS — E1065 Type 1 diabetes mellitus with hyperglycemia: Secondary | ICD-10-CM | POA: Diagnosis not present

## 2021-06-28 DIAGNOSIS — E785 Hyperlipidemia, unspecified: Secondary | ICD-10-CM

## 2021-06-28 DIAGNOSIS — R41840 Attention and concentration deficit: Secondary | ICD-10-CM | POA: Diagnosis not present

## 2021-06-28 DIAGNOSIS — F411 Generalized anxiety disorder: Secondary | ICD-10-CM

## 2021-06-28 LAB — COMPREHENSIVE METABOLIC PANEL
ALT: 17 U/L (ref 0–53)
AST: 11 U/L (ref 0–37)
Albumin: 4.8 g/dL (ref 3.5–5.2)
Alkaline Phosphatase: 64 U/L (ref 39–117)
BUN: 8 mg/dL (ref 6–23)
CO2: 29 mEq/L (ref 19–32)
Calcium: 10.1 mg/dL (ref 8.4–10.5)
Chloride: 103 mEq/L (ref 96–112)
Creatinine, Ser: 0.89 mg/dL (ref 0.40–1.50)
GFR: 114.13 mL/min (ref >60.00)
Glucose, Bld: 83 mg/dL (ref 70–99)
Potassium: 4.3 mEq/L (ref 3.5–5.1)
Sodium: 142 mEq/L (ref 135–145)
Total Bilirubin: 0.4 mg/dL (ref 0.2–1.2)
Total Protein: 7.2 g/dL (ref 6.0–8.3)

## 2021-06-28 LAB — CBC
HCT: 45.9 % (ref 39.0–52.0)
Hemoglobin: 15.7 g/dL (ref 13.0–17.0)
MCHC: 34.1 g/dL (ref 30.0–36.0)
MCV: 92.8 fl (ref 78.0–100.0)
Platelets: 255 10*3/uL (ref 150.0–400.0)
RBC: 4.95 Mil/uL (ref 4.22–5.81)
RDW: 12.6 % (ref 11.5–15.5)
WBC: 4.9 10*3/uL (ref 4.0–10.5)

## 2021-06-28 LAB — LIPID PANEL
Cholesterol: 149 mg/dL (ref 0–200)
HDL: 38.8 mg/dL — ABNORMAL LOW (ref >39.00)
LDL Cholesterol: 76 mg/dL (ref 0–99)
NonHDL: 110.52
Total CHOL/HDL Ratio: 4
Triglycerides: 174 mg/dL — ABNORMAL HIGH (ref 0.0–149.0)
VLDL: 34.8 mg/dL (ref 0.0–40.0)

## 2021-06-28 LAB — HEMOGLOBIN A1C: Hgb A1c MFr Bld: 10 % — ABNORMAL HIGH (ref 4.6–6.5)

## 2021-06-28 LAB — MICROALBUMIN / CREATININE URINE RATIO
Creatinine,U: 259.7 mg/dL
Microalb Creat Ratio: 1.2 mg/g (ref 0.0–30.0)
Microalb, Ur: 3 mg/dL — ABNORMAL HIGH (ref 0.0–1.9)

## 2021-06-28 LAB — T3, FREE: T3, Free: 3.3 pg/mL (ref 2.3–4.2)

## 2021-06-28 LAB — TSH: TSH: 5.27 u[IU]/mL (ref 0.35–5.50)

## 2021-06-28 LAB — T4, FREE: Free T4: 0.91 ng/dL (ref 0.60–1.60)

## 2021-06-28 MED ORDER — LISDEXAMFETAMINE DIMESYLATE 30 MG PO CAPS: 30.0000 mg | ORAL_CAPSULE | Freq: Every day | ORAL | 0 refills | Status: DC

## 2021-06-28 MED ORDER — LEVOTHYROXINE SODIUM 200 MCG PO TABS: 200.0000 ug | ORAL_TABLET | Freq: Every day | ORAL | 1 refills | Status: DC

## 2021-06-28 MED ORDER — BUSPIRONE HCL 5 MG PO TABS: 5.0000 mg | ORAL_TABLET | Freq: Every day | ORAL | 5 refills | Status: DC

## 2021-06-28 NOTE — Progress Notes (Signed)
Established Patient Office Visit  Subjective   Patient ID: David Leon, male    DOB: 11-Oct-1990  Age: 31 y.o. MRN: [id number]  Chief Complaint  Patient presents with   Follow-up    HPI  ADHD: States that he was switched to vyvanse. States that he feels that it does not last all day. Ok to stick   DM1:states that he had an appointment with endocrine and states that he did not follow up with them yet. CGM states that his highs have been 300 in the morning if he does not do the fast acting at night. No hundreds or below States that he goes to they gym basic weight lifting about an hour and will walk the dogs in the neighborhood and will take about 20 mins  Anxiety: States that last time we placed him on buspar and states that he feels like it helps. Has been taking  it daily     Review of Systems  Constitutional:  Negative for chills and fever.  Respiratory:  Negative for cough and shortness of breath.   Cardiovascular:  Negative for chest pain, palpitations and leg swelling.  Gastrointestinal:        BM daily  Genitourinary:  Positive for frequency. Negative for dysuria.       Nocturia negative  Neurological:  Negative for tingling and headaches.  Psychiatric/Behavioral:  Negative for hallucinations and suicidal ideas.      Objective:     BP 124/90   Pulse 85   Temp (!) 97.5 F (36.4 C)   Resp 12   Ht 5\' 11"  (1.803 m)   Wt 258 lb (117 kg)   SpO2 97%   BMI 35.98 kg/m  BP Readings from Last 3 Encounters:  06/28/21 124/90  11/30/20 128/90  12/30/19 120/82   Wt Readings from Last 3 Encounters:  06/28/21 258 lb (117 kg)  11/30/20 266 lb (120.7 kg)  12/30/19 258 lb 6.4 oz (117.2 kg)      Physical Exam Constitutional:      Appearance: Normal appearance.  HENT:     Right Ear: Tympanic membrane, ear canal and external ear normal.     Left Ear: Tympanic membrane, ear canal and external ear normal.     Mouth/Throat:     Mouth: Mucous membranes are moist.      Pharynx: Oropharynx is clear.  Eyes:     Extraocular Movements: Extraocular movements intact.     Pupils: Pupils are equal, round, and reactive to light.  Cardiovascular:     Rate and Rhythm: Normal rate and regular rhythm.     Pulses: Normal pulses.     Heart sounds: Normal heart sounds.  Pulmonary:     Effort: Pulmonary effort is normal.     Breath sounds: Normal breath sounds.  Abdominal:     General: Bowel sounds are normal.  Musculoskeletal:     Right lower leg: No edema.     Left lower leg: No edema.  Lymphadenopathy:     Cervical: No cervical adenopathy.  Neurological:     Mental Status: He is alert.  Psychiatric:        Mood and Affect: Mood normal.        Behavior: Behavior normal.        Thought Content: Thought content normal.        Judgment: Judgment normal.   Diabetic Foot Form - Detailed   Diabetic Foot Exam - detailed Diabetic Foot exam was performed with the  following findings: Yes 06/28/2021 10:13 AM  Can the patient see the bottom of their feet?: Yes Are the shoes appropriate in style and fit?: Yes Is there swelling or and abnormal foot shape?: No Is there a claw toe deformity?: No Is there elevated skin temparature?: No Is there foot or ankle muscle weakness?: No Normal Range of Motion: Yes Right posterior Tibialias: Present Left posterior Tibialias: Present   Right Dorsalis Pedis: Present Left Dorsalis Pedis: Present  Sensory Foot Exam Completed.: Yes Semmes-Weinstein Monofilament Test   Comments: Felt all points tested      No results found for any visits on 06/28/21.    The ASCVD Risk score (Arnett DK, et al., 2019) failed to calculate for the following reasons:   The 2019 ASCVD risk score is only valid for ages 57 to 34    Assessment & Plan:   Problem List Items Addressed This Visit       Endocrine   Hypothyroidism   Relevant Medications   levothyroxine (SYNTHROID) 200 MCG tablet   Other Relevant Orders   TSH   T4, free    T3, free   Uncontrolled type 1 diabetes mellitus with hyperglycemia ([place])    Patient was referred to endocrinology and has appointment at cancer due to family emergencies.  Patient states he has not had a follow-up appointment will call back to reschedule strongly encourage patient to do such as its been 6 months since I have seen him and on the blood work.  Pending labs today continue taking Lantus 27 units twice daily along with sliding scale insulin for meals       Relevant Medications   NOVOLOG FLEXPEN 100 UNIT/ML FlexPen   Other Relevant Orders   Microalbumin/Creatinine Ratio, Urine   CBC   Lipid panel   Comprehensive metabolic panel   Hemoglobin A1c     Other   Hyperlipidemia    Rosuvastatin 5 mg.  Taking medication as prescribed patient also been working on diet and exercise and has lost approximately 10 pounds.  Encouraged healthy lifestyle       Relevant Orders   Lipid panel   Attention and concentration deficit - Primary    Patient was maintained on Adderall 20 mg XR.  States he felt like it worked better than the Vyvanse 30 mg.  We did run to the issue with having Adderall XR on backorder and patient states that he would like to stick with the Vyvanse 30 as it does work enough and have difficulty finding stock of medication       Relevant Medications   lisdexamfetamine (VYVANSE) 30 MG capsule   Class 2 severe obesity due to excess calories with serious comorbidity and body mass index (BMI) of 36.0 to 36.9 in adult [facility])   Relevant Medications   NOVOLOG FLEXPEN 100 UNIT/ML FlexPen   lisdexamfetamine (VYVANSE) 30 MG capsule   GAD (generalized anxiety disorder)    Currently maintained on Wellbutrin.  When I saw patient last we did start BuSpar 5 mg twice daily.  Patient states has been taking it daily and as needed that does help.  We can continue the medication change directions to daily       Relevant Medications   busPIRone (BUSPAR) 5 MG tablet    Return in about  6 months (around 12/28/2021) for CPE and labs.    Romilda Garret, NP

## 2021-06-28 NOTE — Assessment & Plan Note (Signed)
Rosuvastatin 5 mg.  Taking medication as prescribed patient also been working on diet and exercise and has lost approximately 10 pounds.  Encouraged healthy lifestyle

## 2021-06-28 NOTE — Assessment & Plan Note (Signed)
Currently maintained on Wellbutrin.  When I saw patient last we did start BuSpar 5 mg twice daily.  Patient states has been taking it daily and as needed that does help.  We can continue the medication change directions to daily

## 2021-06-28 NOTE — Assessment & Plan Note (Signed)
Patient was maintained on Adderall 20 mg XR.  States he felt like it worked better than the Vyvanse 30 mg.  We did run to the issue with having Adderall XR on backorder and patient states that he would like to stick with the Vyvanse 30 as it does work enough and have difficulty finding stock of medication

## 2021-06-28 NOTE — Patient Instructions (Signed)
Nice to see you today Follow up with me in 6 weeks Call the endocrine office and get on the schedule

## 2021-06-28 NOTE — Assessment & Plan Note (Signed)
Patient was referred to endocrinology and has appointment at cancer due to family emergencies.  Patient states he has not had a follow-up appointment will call back to reschedule strongly encourage patient to do such as its been 6 months since I have seen him and on the blood work.  Pending labs today continue taking Lantus 27 units twice daily along with sliding scale insulin for meals

## 2021-07-11 ENCOUNTER — Other Ambulatory Visit: Payer: Self-pay

## 2021-07-11 DIAGNOSIS — E109 Type 1 diabetes mellitus without complications: Secondary | ICD-10-CM

## 2021-07-11 NOTE — Telephone Encounter (Signed)
Left message and sent mychart message to the patient 

## 2021-07-11 NOTE — Telephone Encounter (Signed)
Refill request from [facility] pharmacy

## 2021-07-14 MED ORDER — LANTUS SOLOSTAR 100 UNIT/ML ~~LOC~~ SOPN
28.0000 [IU] | PEN_INJECTOR | Freq: Two times a day (BID) | SUBCUTANEOUS | 1 refills | Status: DC
Start: 2021-07-14 — End: 2021-08-28

## 2021-08-26 ENCOUNTER — Other Ambulatory Visit: Payer: Self-pay | Admitting: Nurse Practitioner

## 2021-08-26 DIAGNOSIS — E109 Type 1 diabetes mellitus without complications: Secondary | ICD-10-CM

## 2021-08-28 ENCOUNTER — Encounter: Payer: Self-pay | Admitting: Nurse Practitioner

## 2021-08-28 DIAGNOSIS — E109 Type 1 diabetes mellitus without complications: Secondary | ICD-10-CM

## 2021-08-28 MED ORDER — LANTUS SOLOSTAR 100 UNIT/ML ~~LOC~~ SOPN
28.0000 [IU] | PEN_INJECTOR | Freq: Two times a day (BID) | SUBCUTANEOUS | 1 refills | Status: DC
Start: 2021-08-28 — End: 2021-10-30

## 2021-09-03 ENCOUNTER — Other Ambulatory Visit: Payer: Self-pay | Admitting: Nurse Practitioner

## 2021-09-03 DIAGNOSIS — E039 Hypothyroidism, unspecified: Secondary | ICD-10-CM

## 2021-10-02 ENCOUNTER — Other Ambulatory Visit: Payer: Self-pay | Admitting: Nurse Practitioner

## 2021-10-02 ENCOUNTER — Encounter: Payer: Self-pay | Admitting: Nurse Practitioner

## 2021-10-02 DIAGNOSIS — E1065 Type 1 diabetes mellitus with hyperglycemia: Secondary | ICD-10-CM

## 2021-10-02 DIAGNOSIS — R41840 Attention and concentration deficit: Secondary | ICD-10-CM

## 2021-10-03 ENCOUNTER — Other Ambulatory Visit: Payer: Self-pay

## 2021-10-03 MED ORDER — NOVOLOG FLEXPEN 100 UNIT/ML ~~LOC~~ SOPN
PEN_INJECTOR | SUBCUTANEOUS | 1 refills | Status: DC
  Filled 2021-10-03: qty 15, fill #0

## 2021-10-04 MED ORDER — LISDEXAMFETAMINE DIMESYLATE 30 MG PO CAPS: 30.0000 mg | ORAL_CAPSULE | Freq: Every day | ORAL | 0 refills | Status: DC

## 2021-10-04 MED ORDER — NOVOLOG FLEXPEN 100 UNIT/ML ~~LOC~~ SOPN: PEN_INJECTOR | SUBCUTANEOUS | 1 refills | Status: DC

## 2021-10-11 ENCOUNTER — Other Ambulatory Visit: Payer: Self-pay | Admitting: Nurse Practitioner

## 2021-10-11 DIAGNOSIS — E039 Hypothyroidism, unspecified: Secondary | ICD-10-CM

## 2021-10-29 ENCOUNTER — Encounter: Payer: Self-pay | Admitting: Nurse Practitioner

## 2021-10-29 ENCOUNTER — Other Ambulatory Visit: Payer: Self-pay | Admitting: Nurse Practitioner

## 2021-10-29 DIAGNOSIS — E109 Type 1 diabetes mellitus without complications: Secondary | ICD-10-CM

## 2021-10-30 ENCOUNTER — Other Ambulatory Visit: Payer: Self-pay | Admitting: Nurse Practitioner

## 2021-10-30 DIAGNOSIS — E109 Type 1 diabetes mellitus without complications: Secondary | ICD-10-CM

## 2021-10-30 MED ORDER — LANTUS SOLOSTAR 100 UNIT/ML ~~LOC~~ SOPN: 28.0000 [IU] | PEN_INJECTOR | Freq: Two times a day (BID) | SUBCUTANEOUS | 1 refills | Status: DC

## 2021-11-28 ENCOUNTER — Other Ambulatory Visit: Payer: Self-pay | Admitting: Nurse Practitioner

## 2021-11-28 DIAGNOSIS — E039 Hypothyroidism, unspecified: Secondary | ICD-10-CM

## 2021-12-06 DIAGNOSIS — E039 Hypothyroidism, unspecified: Secondary | ICD-10-CM | POA: Diagnosis not present

## 2021-12-06 DIAGNOSIS — E1065 Type 1 diabetes mellitus with hyperglycemia: Secondary | ICD-10-CM | POA: Diagnosis not present

## 2021-12-11 DIAGNOSIS — R030 Elevated blood-pressure reading, without diagnosis of hypertension: Secondary | ICD-10-CM | POA: Diagnosis not present

## 2021-12-11 DIAGNOSIS — Z1159 Encounter for screening for other viral diseases: Secondary | ICD-10-CM | POA: Diagnosis not present

## 2021-12-11 DIAGNOSIS — Z87891 Personal history of nicotine dependence: Secondary | ICD-10-CM | POA: Diagnosis not present

## 2021-12-11 DIAGNOSIS — Z880 Allergy status to penicillin: Secondary | ICD-10-CM | POA: Diagnosis not present

## 2021-12-11 DIAGNOSIS — Z79890 Hormone replacement therapy (postmenopausal): Secondary | ICD-10-CM | POA: Diagnosis not present

## 2021-12-11 DIAGNOSIS — Z113 Encounter for screening for infections with a predominantly sexual mode of transmission: Secondary | ICD-10-CM | POA: Diagnosis not present

## 2021-12-11 DIAGNOSIS — E039 Hypothyroidism, unspecified: Secondary | ICD-10-CM | POA: Diagnosis not present

## 2021-12-11 DIAGNOSIS — E785 Hyperlipidemia, unspecified: Secondary | ICD-10-CM | POA: Diagnosis not present

## 2021-12-11 DIAGNOSIS — Z9189 Other specified personal risk factors, not elsewhere classified: Secondary | ICD-10-CM | POA: Diagnosis not present

## 2021-12-11 DIAGNOSIS — E109 Type 1 diabetes mellitus without complications: Secondary | ICD-10-CM | POA: Diagnosis not present

## 2021-12-11 DIAGNOSIS — Z5181 Encounter for therapeutic drug level monitoring: Secondary | ICD-10-CM | POA: Diagnosis not present

## 2021-12-11 DIAGNOSIS — Z79899 Other long term (current) drug therapy: Secondary | ICD-10-CM | POA: Diagnosis not present

## 2021-12-11 DIAGNOSIS — B20 Human immunodeficiency virus [HIV] disease: Secondary | ICD-10-CM | POA: Diagnosis not present

## 2021-12-11 DIAGNOSIS — I10 Essential (primary) hypertension: Secondary | ICD-10-CM | POA: Diagnosis not present

## 2021-12-11 DIAGNOSIS — Z23 Encounter for immunization: Secondary | ICD-10-CM | POA: Diagnosis not present

## 2021-12-28 ENCOUNTER — Ambulatory Visit: Payer: Self-pay | Admitting: Nurse Practitioner

## 2022-01-11 ENCOUNTER — Other Ambulatory Visit: Payer: Self-pay | Admitting: Nurse Practitioner

## 2022-01-11 DIAGNOSIS — F411 Generalized anxiety disorder: Secondary | ICD-10-CM

## 2022-01-12 ENCOUNTER — Encounter: Payer: Self-pay | Admitting: Nurse Practitioner

## 2022-01-12 DIAGNOSIS — R41840 Attention and concentration deficit: Secondary | ICD-10-CM

## 2022-01-12 MED ORDER — LISDEXAMFETAMINE DIMESYLATE 30 MG PO CAPS: 30.0000 mg | ORAL_CAPSULE | Freq: Every day | ORAL | 0 refills | Status: DC

## 2022-01-29 ENCOUNTER — Other Ambulatory Visit (HOSPITAL_COMMUNITY): Payer: Self-pay

## 2022-01-29 ENCOUNTER — Ambulatory Visit: Payer: BC Managed Care – PPO | Admitting: Internal Medicine

## 2022-01-29 NOTE — Progress Notes (Deleted)
[facility]  Reason for Consult: HIV new patient Referring Provider: Andres Ege, FNP   HPI:    David Leon is a 32 y.o. male with a past medical history as below who presents to clinic as a new patient for HIV care.    Patient here today for new HIV follow up appointment.  He has been retained in care at [facility] in [place].  Followed by Andres Ege, FNP and seen as recently as November 2023.    Per the notes from Care Everywhere: New patient visit 04/19/2017 in [facility] clinic.  Acute infection 04/08/2017.  Viral load 64K , highest VL [facility] 13,400 c/ml on 04/19/2017.  Lowest CD4 689 07/18/2017. Genotype at baseline 04/19/2017 35D, 135T, 245E   Patient had been treated with Triumeq as part of research study.  There was concern at the visit in November about weight gain due to INSTI and he was switched to Delstrigo at that time.  He has had no issues with taking this and reports adherence.    HIV diagnosed: March 2019 CD4 at diagnosis: 689 VL at diagnosis:64,000 Prior ART: Triumeq Current ART: Delstrigo (Nov 2023) Hx of OI: None Hx of STI: None Risk factors: ***  Genotype Hx: Genotype at baseline 04/19/2017 35D, 135T, 245E   Patient's Medications  New Prescriptions   No medications on file  Previous Medications   ABACAVIR-DOLUTEGRAVIR-LAMIVUDINE (TRIUMEQ) 600-50-300 MG TABLET    Take 1 tablet by mouth daily after breakfast.    BUPROPION (WELLBUTRIN XL) 300 MG 24 HR TABLET    Take 1 tablet by mouth once daily   BUSPIRONE (BUSPAR) 5 MG TABLET    Take 1 tablet (5 mg total) by mouth daily.   CONTINUOUS BLOOD GLUC RECEIVER (FREESTYLE LIBRE 14 DAY READER) DEVI    1 Units by Does not apply route daily as needed.   CONTINUOUS BLOOD GLUC SENSOR (FREESTYLE LIBRE 14 DAY SENSOR) MISC    1 Units by Does not apply route daily as needed.   GLUCAGON 1 MG INJECTION    Inject 1 mg into the muscle once as needed.   INSULIN GLARGINE (LANTUS SOLOSTAR) 100 UNIT/ML  SOLOSTAR PEN    Inject 28 Units into the skin 2 (two) times daily.   INSULIN PEN NEEDLE 31G X 5 MM MISC    40 Units by Does not apply route at bedtime.   LEVOTHYROXINE (SYNTHROID) 200 MCG TABLET    TAKE 1 TABLET BY MOUTH ONCE DAILY BEFORE BREAKFAST   LISDEXAMFETAMINE (VYVANSE) 30 MG CAPSULE    Take 1 capsule (30 mg total) by mouth daily.   LISDEXAMFETAMINE (VYVANSE) 30 MG CAPSULE    Take 1 capsule (30 mg total) by mouth daily.   LISDEXAMFETAMINE (VYVANSE) 30 MG CAPSULE    Take 1 capsule (30 mg total) by mouth daily. NEED OFFICE VISIT FOR FURTHER REFILLS   MULTIPLE VITAMINS-MINERALS (MULTIVITAMIN ADULT PO)    Take 1 tablet by mouth daily.   NOVOLOG FLEXPEN 100 UNIT/ML FLEXPEN    As directed with sliding scale   ROSUVASTATIN (CRESTOR) 5 MG TABLET    Take 5 mg by mouth.  Modified Medications   No medications on file  Discontinued Medications   No medications on file      Past Medical History:  Diagnosis Date   Diabetes mellitus without complication (HCC)    Diabetic ketoacidosis without coma associated with type 1 diabetes mellitus (HCC)    HIV infection (HCC)  Hyperlipidemia 09/11/2014   Thrombocytopenia ([place]) 09/10/2014   Thyroid disease     Social History   Tobacco Use   Smoking status: Former   Smokeless tobacco: Never  Substance Use Topics   Alcohol use: Yes    Alcohol/week: 6.0 standard drinks of alcohol    Types: 6 Shots of liquor per week    Comment: 6 drinks a week. Wine mostly some liquor   Drug use: No    Family History  Problem Relation Age of Onset   Anxiety disorder Mother    Heart attack Mother    Lupus Mother    Congestive Heart Failure Mother    Diabetes Father    Anxiety disorder Sister    Anxiety disorder Brother     Allergies  Allergen Reactions   Penicillins Other (See Comments)    Has not had a reaction, just family history of same Has patient had a PCN reaction causing immediate rash, facial/tongue/throat swelling, SOB or lightheadedness with  hypotension: No Has patient had a PCN reaction causing severe rash involving mucus membranes or skin necrosis: No Has patient had a PCN reaction that required hospitalization: No Has patient had a PCN reaction occurring within the last 10 years: No If all of the above answers are "NO", then may proceed with Cephalosporin use.     ROS   OBJECTIVE:    There were no vitals filed for this visit.   There is no height or weight on file to calculate BMI.   Physical Exam  Labs and Microbiology:    Latest Ref Rng & Units 06/28/2021   10:18 AM 12/01/2020    2:58 PM 11/30/2020    9:19 AM  CMP  Glucose 70 - 99 mg/dL 83  206  635   BUN 6 - 23 mg/dL 8  15  14    Creatinine 0.40 - 1.50 mg/dL 0.89  1.09  1.16   Sodium 135 - 145 mEq/L 142  135  129   Potassium 3.5 - 5.1 mEq/L 4.3  4.2  5.3   Chloride 96 - 112 mEq/L 103  96  94   CO2 19 - 32 mEq/L 29  30  29    Calcium 8.4 - 10.5 mg/dL 10.1  9.6  9.7   Total Protein 6.0 - 8.3 g/dL 7.2   7.2   Total Bilirubin 0.2 - 1.2 mg/dL 0.4   0.3   Alkaline Phos 39 - 117 U/L 64   70   AST 0 - 37 U/L 11   26   ALT 0 - 53 U/L 17   46       Latest Ref Rng & Units 06/28/2021   10:18 AM 11/30/2020    9:19 AM 12/06/2017    9:36 PM  CBC  WBC 4.0 - 10.5 K/uL 4.9  5.3  6.7   Hemoglobin 13.0 - 17.0 g/dL 15.7  15.7  15.8   Hematocrit 39.0 - 52.0 % 45.9  47.0  45.7   Platelets 150.0 - 400.0 K/uL 255.0  209.0  205      Lab Results  Component Value Date   HIV1RNAQUANT 64,000 (H) 04/15/2017    RPR and STI: Lab Results  Component Value Date   LABRPR NON-REACTIVE 04/08/2017        No data to display          Hepatitis B: No results found for: "HEPBSAB", "HEPBSAG", "HEPBCAB" Hepatitis C: No results found for: "HEPCAB", "HCVRNAPCRQN" Hepatitis A: No results found for: "HAV"  Lipids: Lab Results  Component Value Date   CHOL 149 06/28/2021   TRIG 174.0 (H) 06/28/2021   HDL 38.80 (L) 06/28/2021   CHOLHDL 4 06/28/2021   VLDL 34.8 06/28/2021    LDLCALC 76 06/28/2021    Imaging: ***    ASSESSMENT & PLAN:    No problem-specific Assessment & Plan notes found for this encounter.   No orders of the defined types were placed in this encounter.   There are no diagnoses linked to this encounter.   *** Vaccines Influenza: give every year COVID: recommend vaccination if not already done Prevnar 20: Give x 1 if no prior pneumonia vaccine.    - If only 1 dose of either PPSV 23 OR PCV 13 ----> give PCV 20 if > 1 year since last vaccine  - If received both PPSV 23 AND PCV 13 ----> give PCV 20 if > 5 years since last vaccine.  If < 5 years then wait to give PCV 20 Pneumovax-23: (if CD4 >200) give twice every 5 years apart before age 53, then once at age 39.  Give >8 weeks from [place] Prevnar-13: (preferably when CD4 >200) give once, give >1 year from last Pneumovax-23 Hepatitis A: give Havrix 2 dose series at 0 and 6-12 months if non-immune Hepatitis B: give Heplisav 2 dose series at 0 and 4 weeks if non-immune.  Repeat serology 2 months after vaccine and revaccinate if needed MenACWY: 2 dose primary series 8 weeks apart, then 1 dose booster every 5 years HPV: Gardasil-9 at 0, 2, and 6 months for ages 9-26 should be vaccinated.  Ages 59-45 should be offered if appropriate Tdap: give every 10 years Shingles: give Shingrix 2 dose series at 0 and 2-6 months if >50 years on ART with CD4 cell count >200 Varicella: primary vaccination may be considered in VZV seronegative persons aged >8 years (if CD4 >200)  MMR: vaccine should be given if born in 30 or after and do not have immunity (if CD4 >200)  Screening DEXA Scan: if age >1 Quantiferon: check at initiation of care Hepatitis C: check at initiation of care.  Screen annually if risk factors HLA [id number]: check at initiation of care G6PD: check if starting therapy with oxidant drugs Lipids: check annually Urinalysis: check annually or every 6 months if on tenofovir Hgb A1c:  check annually  ASCVD Risk Score Consider high-intensity statin therapy if 10-year ASCVD risk score >7.5% The ASCVD Risk score (Arnett DK, et al., 2019) failed to calculate for the following reasons:   The 2019 ASCVD risk score is only valid for ages 34 to [place] [facility] [place] 01/29/2022, 5:07 AM    HIV: Unclear if Triumeq was contributing to weight gain of if this was due more so to lifestyle modifications.  Nonetheless, now on Delstrigo and reports no issues.  Weight today is *** whereas was 254 pounds in November 2023.  Will continue with Delstrigo and send refills today along with checking labs.  Follow up in 6 months.    Vaccine counseling: Will check hepatitis A and B serology to determine if vaccination needed.  Mpox ***.  STI/Screening: Screening in Nov 2023 was negative.  Offered repeat screen today and he ***.   Encounter for medication monitoring: ***  Type 1 DM: Follows with endocrinology and recent A1c was > 10.  Advised endocrinology follow up.    HLD: Currently on Crestor 5mg  daily.

## 2022-02-05 ENCOUNTER — Ambulatory Visit: Payer: BC Managed Care – PPO | Admitting: Nurse Practitioner

## 2022-02-05 ENCOUNTER — Encounter: Payer: Self-pay | Admitting: Nurse Practitioner

## 2022-02-05 VITALS — BP 128/82 | HR 107 | Ht 71.0 in | Wt 258.0 lb

## 2022-02-05 DIAGNOSIS — R030 Elevated blood-pressure reading, without diagnosis of hypertension: Secondary | ICD-10-CM

## 2022-02-05 DIAGNOSIS — Z21 Asymptomatic human immunodeficiency virus [HIV] infection status: Secondary | ICD-10-CM | POA: Diagnosis not present

## 2022-02-05 DIAGNOSIS — F411 Generalized anxiety disorder: Secondary | ICD-10-CM

## 2022-02-05 DIAGNOSIS — R41840 Attention and concentration deficit: Secondary | ICD-10-CM

## 2022-02-05 DIAGNOSIS — E1065 Type 1 diabetes mellitus with hyperglycemia: Secondary | ICD-10-CM

## 2022-02-05 MED ORDER — LISDEXAMFETAMINE DIMESYLATE 30 MG PO CAPS: 30.0000 mg | ORAL_CAPSULE | Freq: Every day | ORAL | 0 refills | Status: DC

## 2022-02-05 MED ORDER — BUSPIRONE HCL 5 MG PO TABS: 5.0000 mg | ORAL_TABLET | Freq: Every day | ORAL | 1 refills | Status: DC

## 2022-02-05 NOTE — Assessment & Plan Note (Signed)
Patient is gathering with [facility].  He had his initial consult.  Continue taking medication as prescribed follow-up with them as recommended.  Last A1c 10.1% through their clinic

## 2022-02-05 NOTE — Assessment & Plan Note (Signed)
His specialist was concerned because patient had elevated blood pressure readings at their office.  Blood pressure is within normal limits every time he is been here with [place].  Encourage patient to check blood pressure at home he will create a log and send it to me via MyChart.  Parameters discussed.  Patient does have a wrist cuff so did warn him for the possibility of less accurate readings

## 2022-02-05 NOTE — Assessment & Plan Note (Signed)
Patient currently on Triumeq followed by [facility] ID patient is transferring to regional ID.  Continue medication as prescribed follow-up with clinic as recommended

## 2022-02-05 NOTE — Progress Notes (Signed)
Established Patient Office Visit  Subjective   Patient ID: Jayveon Convey, male    DOB: 1990/06/28  Age: 32 y.o. MRN: [id number]  Chief Complaint  Patient presents with   Follow-up    HPI  ADD: patient currently maintained on vyvanse 62. No insomnia, palpitations, or   GAD: patient currently maintained on wellbutrin 200mg  and buspar. States that he is doing fine. He states that he has taken them for so long he is unsure if it is working as good. States that he feels very irritable. States that he needs a refill needed as he has ran   DM1: seeing [facility] huffman. Last appt was 12/06/2021. A1C was 10.1. States that he has an appointment with them around the end of  jan.  ID: Patient currently seen [facility] infectious disease patient is planning on switching over to [facility] he will cone.  Patient currently on Triumeq  Elevated BP: had a BP reading of 137/98 on 12/06/2021 Then a BP reading of 134/95 at ID clinic on 12/11/2021   Review of Systems  Constitutional:  Negative for chills and fever.  Respiratory:  Negative for shortness of breath.   Cardiovascular:  Negative for chest pain.  Gastrointestinal:        BM daily   Genitourinary:  Negative for dysuria and hematuria.  Neurological:  Negative for tingling and headaches.  Psychiatric/Behavioral:  Negative for hallucinations and suicidal ideas.       Objective:     BP 128/82   Pulse (!) 107   Ht 5\' 11"  (1.803 m)   Wt 258 lb (117 kg)   SpO2 96%   BMI 35.98 kg/m  BP Readings from Last 3 Encounters:  02/05/22 128/82  06/28/21 124/90  11/30/20 128/90   Wt Readings from Last 3 Encounters:  02/05/22 258 lb (117 kg)  06/28/21 258 lb (117 kg)  11/30/20 266 lb (120.7 kg)      Physical Exam Vitals and nursing note reviewed.  Constitutional:      Appearance: Normal appearance.  Cardiovascular:     Rate and Rhythm: Normal rate and regular rhythm.     Heart sounds: Normal heart sounds.   Pulmonary:     Effort: Pulmonary effort is normal.     Breath sounds: Normal breath sounds.  Abdominal:     General: Bowel sounds are normal. There is no distension.     Palpations: There is no mass.     Tenderness: There is no abdominal tenderness.     Hernia: No hernia is present.  Musculoskeletal:     Right lower leg: No edema.     Left lower leg: No edema.  Lymphadenopathy:     Cervical: No cervical adenopathy.  Neurological:     Mental Status: He is alert.      No results found for any visits on 02/05/22.    The ASCVD Risk score (Arnett DK, et al., 2019) failed to calculate for the following reasons:   The 2019 ASCVD risk score is only valid for ages 58 to 61    Assessment & Plan:   Problem List Items Addressed This Visit       Endocrine   Uncontrolled type 1 diabetes mellitus with hyperglycemia ([place]) - Primary    Patient is gathering [facility].  He had his initial consult.  Continue taking medication as prescribed follow-up with them as recommended.  Last A1c 10.1% through their clinic      Relevant  Medications   BAQSIMI ONE PACK 3 MG/DOSE POWD   TOUJEO MAX SOLOSTAR 300 UNIT/ML Solostar Pen     Other   Asymptomatic HIV infection ([place])    Patient currently on Triumeq followed by [facility] ID patient is transferring to regional ID.  Continue medication as prescribed follow-up with clinic as recommended      Attention and concentration deficit    Patient currently maintained on Vyvanse 30 mg.  PDMP reviewed.  No red flags.  Patient is tolerating medication well.  Continue      Relevant Medications   lisdexamfetamine (VYVANSE) 30 MG capsule   lisdexamfetamine (VYVANSE) 30 MG capsule   lisdexamfetamine (VYVANSE) 30 MG capsule   GAD (generalized anxiety disorder)    Patient currently maintained on Wellbutrin 300 mg daily along with buspirone 5 mg.  Patient is a refill of BuSpar feels like it is helpful.      Relevant Medications    busPIRone (BUSPAR) 5 MG tablet   Elevated blood pressure reading without diagnosis of hypertension    His specialist was concerned because patient had elevated blood pressure readings at their office.  Blood pressure is within normal limits every time he is been here with [place].  Encourage patient to check blood pressure at home he will create a log and send it to me via MyChart.  Parameters discussed.  Patient does have a wrist cuff so did warn him for the possibility of less accurate readings       Return in about 6 months (around 08/06/2022) for CPE and Labs.    Romilda Garret, NP

## 2022-02-05 NOTE — Patient Instructions (Signed)
Nice to see you today Next office visit will be your physical, this is with FASTING labs. That will be in 6 months.  Check your blood pressure daily for the next 10 days, record them and send them to me via mychart. Blood pressure was good today The top number we want below 140 and the bottom number we want below 90

## 2022-02-05 NOTE — Assessment & Plan Note (Signed)
Patient currently maintained on Vyvanse 30 mg.  PDMP reviewed.  No red flags.  Patient is tolerating medication well.  Continue

## 2022-02-05 NOTE — Assessment & Plan Note (Signed)
Patient currently maintained on Wellbutrin 300 mg daily along with buspirone 5 mg.  Patient is a refill of BuSpar feels like it is helpful.

## 2022-03-29 ENCOUNTER — Other Ambulatory Visit: Payer: Self-pay | Admitting: Nurse Practitioner

## 2022-03-29 DIAGNOSIS — E039 Hypothyroidism, unspecified: Secondary | ICD-10-CM

## 2022-04-14 ENCOUNTER — Other Ambulatory Visit: Payer: Self-pay | Admitting: Nurse Practitioner

## 2022-04-14 DIAGNOSIS — F411 Generalized anxiety disorder: Secondary | ICD-10-CM

## 2022-05-24 ENCOUNTER — Ambulatory Visit: Payer: Self-pay | Admitting: Podiatry

## 2022-05-28 ENCOUNTER — Ambulatory Visit: Payer: BC Managed Care – PPO | Admitting: Podiatry

## 2022-05-28 ENCOUNTER — Other Ambulatory Visit: Payer: Self-pay | Admitting: Nurse Practitioner

## 2022-05-28 DIAGNOSIS — B351 Tinea unguium: Secondary | ICD-10-CM | POA: Diagnosis not present

## 2022-05-28 DIAGNOSIS — L608 Other nail disorders: Secondary | ICD-10-CM | POA: Diagnosis not present

## 2022-05-28 DIAGNOSIS — L603 Nail dystrophy: Secondary | ICD-10-CM

## 2022-05-28 DIAGNOSIS — L601 Onycholysis: Secondary | ICD-10-CM | POA: Diagnosis not present

## 2022-05-28 MED ORDER — TERBINAFINE HCL 250 MG PO TABS: 250.0000 mg | ORAL_TABLET | Freq: Every day | ORAL | 2 refills | Status: AC

## 2022-05-28 MED ORDER — CICLOPIROX 8 % EX SOLN: Freq: Every day | CUTANEOUS | 0 refills | Status: AC

## 2022-05-28 NOTE — Progress Notes (Signed)
  Subjective:  Patient ID: David Leon, male    DOB: 11-28-1990,  MRN: [id number]  Chief Complaint  Patient presents with   Nail Problem    Nail fungus to left hallux. Patient denies any pain. Patient administers insulin for diabetes control.  Patient tried OTC medication with no success.    32 y.o. male presents with concern for possible nail fungal infection to the left great toe.  Patient denies pain but does have yellowish discoloration.  He has previously had damage to the nail in the past.  He has tried over-the-counter antifungal with no success.  Past Medical History:  Diagnosis Date   Diabetes mellitus without complication (HCC)    Diabetic ketoacidosis without coma associated with type 1 diabetes mellitus (HCC)    HIV infection (HCC)    Hyperlipidemia 09/11/2014   Thrombocytopenia (HCC) 09/10/2014   Thyroid disease     Allergies  Allergen Reactions   Penicillins Other (See Comments)    Has not had a reaction, just family history of same Has patient had a PCN reaction causing immediate rash, facial/tongue/throat swelling, SOB or lightheadedness with hypotension: No Has patient had a PCN reaction causing severe rash involving mucus membranes or skin necrosis: No Has patient had a PCN reaction that required hospitalization: No Has patient had a PCN reaction occurring within the last 10 years: No If all of the above answers are "NO", then may proceed with Cephalosporin use.     ROS: Negative except as per HPI above  Objective:  General: AAO x3, NAD  Dermatological: Left hallux nail with yellow discoloration as well as onycholysis and dystrophy at the distal aspect of the nail there is some subungual debris.  Possible nail fungal infection no pain on palpation however.  Vascular:  Dorsalis Pedis artery and Posterior Tibial artery pedal pulses are 2/4 bilateral.  Capillary fill time < 3 sec to all digits.   Neruologic: Grossly intact via light touch bilateral.  Protective threshold intact to all sites bilateral.   Musculoskeletal: No gross boney pedal deformities bilateral. No pain, crepitus, or limitation noted with foot and ankle range of motion bilateral. Muscular strength 5/5 in all groups tested bilateral.  Gait: Unassisted, Nonantalgic.   No images are attached to the encounter.  Assessment:   1. Nail dystrophy   2. Onychomycosis      Plan:  Patient was evaluated and treated and all questions answered.  #Onychomycosis -Educated on etiology of nail fungus. -Nail sample taken for microbiology and histology. -Baseline liver function studies ordered. Will d/c terbinafine if elevated during therapy. -eRx for oral terbinafine250 mg take once daily for 90 days #30. Educated on risks and benefits of the medication Including the risk of liver toxicity -E Rx for Penlac 8% topical solution apply daily to the nail for the next 90 days to affected nail  No follow-ups on file.          Corinna Gab, DPM [facility] / [facility]

## 2022-05-30 ENCOUNTER — Encounter: Payer: Self-pay | Admitting: Nurse Practitioner

## 2022-05-30 DIAGNOSIS — R41840 Attention and concentration deficit: Secondary | ICD-10-CM

## 2022-05-31 MED ORDER — LISDEXAMFETAMINE DIMESYLATE 30 MG PO CAPS
30.0000 mg | ORAL_CAPSULE | Freq: Every day | ORAL | 0 refills | Status: DC
Start: 2022-05-31 — End: 2022-09-03

## 2022-05-31 MED ORDER — LISDEXAMFETAMINE DIMESYLATE 30 MG PO CAPS
30.0000 mg | ORAL_CAPSULE | Freq: Every day | ORAL | 0 refills | Status: DC
Start: 2022-05-31 — End: 2022-09-26

## 2022-05-31 MED ORDER — LISDEXAMFETAMINE DIMESYLATE 30 MG PO CAPS: 30.0000 mg | ORAL_CAPSULE | Freq: Every day | ORAL | 0 refills | Status: DC

## 2022-07-02 ENCOUNTER — Other Ambulatory Visit (INDEPENDENT_AMBULATORY_CARE_PROVIDER_SITE_OTHER): Payer: BC Managed Care – PPO | Admitting: Podiatry

## 2022-07-02 NOTE — Progress Notes (Signed)
Called pt to let him know nail biopsy was negative, discontinue oral antifungal

## 2022-07-22 LAB — HEMOGLOBIN A1C: Hemoglobin A1C: 10.2

## 2022-08-07 ENCOUNTER — Encounter: Payer: Self-pay | Admitting: Nurse Practitioner

## 2022-08-07 ENCOUNTER — Ambulatory Visit (INDEPENDENT_AMBULATORY_CARE_PROVIDER_SITE_OTHER): Payer: BC Managed Care – PPO | Admitting: Nurse Practitioner

## 2022-08-07 VITALS — BP 126/62 | HR 100 | Temp 98.6°F | Ht 71.0 in | Wt 260.0 lb

## 2022-08-07 DIAGNOSIS — E785 Hyperlipidemia, unspecified: Secondary | ICD-10-CM | POA: Diagnosis not present

## 2022-08-07 DIAGNOSIS — E1065 Type 1 diabetes mellitus with hyperglycemia: Secondary | ICD-10-CM | POA: Diagnosis not present

## 2022-08-07 DIAGNOSIS — F411 Generalized anxiety disorder: Secondary | ICD-10-CM

## 2022-08-07 DIAGNOSIS — Z0000 Encounter for general adult medical examination without abnormal findings: Secondary | ICD-10-CM

## 2022-08-07 DIAGNOSIS — E1069 Type 1 diabetes mellitus with other specified complication: Secondary | ICD-10-CM | POA: Diagnosis not present

## 2022-08-07 DIAGNOSIS — R41840 Attention and concentration deficit: Secondary | ICD-10-CM

## 2022-08-07 DIAGNOSIS — R6882 Decreased libido: Secondary | ICD-10-CM | POA: Diagnosis not present

## 2022-08-07 DIAGNOSIS — Z21 Asymptomatic human immunodeficiency virus [HIV] infection status: Secondary | ICD-10-CM

## 2022-08-07 DIAGNOSIS — E039 Hypothyroidism, unspecified: Secondary | ICD-10-CM

## 2022-08-07 LAB — COMPREHENSIVE METABOLIC PANEL
ALT: 12 U/L (ref 0–53)
AST: 11 U/L (ref 0–37)
Albumin: 4.3 g/dL (ref 3.5–5.2)
Alkaline Phosphatase: 50 U/L (ref 39–117)
BUN: 7 mg/dL (ref 6–23)
CO2: 29 mEq/L (ref 19–32)
Calcium: 9.5 mg/dL (ref 8.4–10.5)
Chloride: 101 mEq/L (ref 96–112)
Creatinine, Ser: 0.91 mg/dL (ref 0.40–1.50)
GFR: 111.37 mL/min (ref >60.00)
Glucose, Bld: 304 mg/dL — ABNORMAL HIGH (ref 70–99)
Potassium: 4.4 mEq/L (ref 3.5–5.1)
Sodium: 136 mEq/L (ref 135–145)
Total Bilirubin: 0.6 mg/dL (ref 0.2–1.2)
Total Protein: 6.6 g/dL (ref 6.0–8.3)

## 2022-08-07 LAB — LIPID PANEL
Cholesterol: 194 mg/dL (ref 0–200)
HDL: 36.9 mg/dL — ABNORMAL LOW (ref >39.00)
LDL Cholesterol: 127 mg/dL — ABNORMAL HIGH (ref 0–99)
NonHDL: 157.04
Total CHOL/HDL Ratio: 5
Triglycerides: 152 mg/dL — ABNORMAL HIGH (ref 0.0–149.0)
VLDL: 30.4 mg/dL (ref 0.0–40.0)

## 2022-08-07 LAB — CBC
HCT: 44.4 % (ref 39.0–52.0)
Hemoglobin: 15.1 g/dL (ref 13.0–17.0)
MCHC: 33.9 g/dL (ref 30.0–36.0)
MCV: 95.5 fl (ref 78.0–100.0)
Platelets: 241 10*3/uL (ref 150.0–400.0)
RBC: 4.65 Mil/uL (ref 4.22–5.81)
RDW: 12.9 % (ref 11.5–15.5)
WBC: 4.4 10*3/uL (ref 4.0–10.5)

## 2022-08-07 LAB — MICROALBUMIN / CREATININE URINE RATIO
Creatinine,U: 144.2 mg/dL
Microalb Creat Ratio: 0.5 mg/g (ref 0.0–30.0)
Microalb, Ur: <0.7 mg/dL (ref 0.0–1.9)

## 2022-08-07 LAB — TESTOSTERONE: Testosterone: 384.54 ng/dL (ref 300.00–890.00)

## 2022-08-07 MED ORDER — HYDROXYZINE PAMOATE 25 MG PO CAPS: 25.0000 mg | ORAL_CAPSULE | Freq: Every evening | ORAL | 0 refills | Status: DC | PRN

## 2022-08-07 NOTE — Assessment & Plan Note (Signed)
Patient currently on long-acting and sliding scale insulin.  He is currently being followed by endocrinology.  Per patient report they are discussing switching him to an insulin pump.  Continue following with endocrinology as recommended taking insulin as prescribed.  Will do urine micro today along with detailed foot exam

## 2022-08-07 NOTE — Assessment & Plan Note (Signed)
Patient currently maintained on bupropion and buspirone.  Does still have some difficulty getting to sleep at night we will add on hydroxyzine 25 mg nightly as needed sedation precautions reviewed.

## 2022-08-07 NOTE — Assessment & Plan Note (Signed)
Patient currently maintained on rosuvastatin 5 mg.  Pending lipid panel today

## 2022-08-07 NOTE — Assessment & Plan Note (Signed)
Patient currently maintained on Vyvanse 30 mg daily seems to tolerate it well.  Continue medication as prescribed

## 2022-08-07 NOTE — Patient Instructions (Signed)
Nice to see you today I will be in touch with the labs once I have reviewed them Follow up with me in 6 months for a medication recheck, sooner if you need me

## 2022-08-07 NOTE — Progress Notes (Signed)
Established Patient Office Visit  Subjective   Patient ID: David Leon, male    DOB: November 20, 1990  Age: 32 y.o. MRN: [id number]  Chief Complaint  Patient presents with   Annual Exam    Fasting     HPI  for complete physical and follow up of chronic conditions.   HIV: Patient is followed by infectious disease and on Triumeq  ADHD: Patient currently maintained on Vyvanse 30 mg daily.  Doing well per patient report  Hypothyroid: Patient currently maintained on levothyroxine 200 mcg daily.  Is currently also being followed by endocrinology.  DM1: Patient is followed by endocrinology through [facility].  Hassell Done.  Patient is currently maintained on Toujeo and NovoLog.  Patient's A1c in the tens.  States that endocrinology is working on getting him insulin pump  Immunizations: -Tetanus: Completed in 2020 -Influenza: Completed this season -Shingles: Too young -Pneumonia: Completed   Diet: Fair diet. States 2 meals a day lunch and dinner. Some snacking through the day. Coffee in the am. Propel the rest of the day  Exercise: walking dog 30-35 mins daily   Eye exam: Completes annually. Contacts last November Dental exam: Completes semi-annually    Colonoscopy: Too young, currently average risk Lung Cancer Screening: N/A  PSA: Too young, currently average risk  Sleep: state that he will go to bed around 11 and get up at 7a. States that he struglles with going to sleep.  States he is cannot close his mind off.  Patient states he has tried trazodone in the past but reports having a groggy/hung over feeling the next day.  Patient states his mother gave him a small dose of Xanax that seem to work well.        Review of Systems  Constitutional:  Negative for chills and fever.  Respiratory:  Negative for shortness of breath.   Cardiovascular:  Negative for chest pain and leg swelling.  Gastrointestinal:  Negative for abdominal pain, blood in stool, constipation,  diarrhea, nausea and vomiting.       BM daily   Genitourinary:  Negative for dysuria and hematuria.  Neurological:  Negative for tingling and headaches.  Psychiatric/Behavioral:  Negative for hallucinations and suicidal ideas.       Objective:     BP 126/62   Pulse 100   Temp 98.6 F (37 C) (Oral)   Ht 5\' 11"  (1.803 m)   Wt 260 lb (117.9 kg)   SpO2 99%   BMI 36.26 kg/m  BP Readings from Last 3 Encounters:  08/07/22 126/62  02/05/22 128/82  06/28/21 124/90   Wt Readings from Last 3 Encounters:  08/07/22 260 lb (117.9 kg)  02/05/22 258 lb (117 kg)  06/28/21 258 lb (117 kg)      Physical Exam Vitals and nursing note reviewed. Exam conducted with a chaperone present (JoEllen thompson, CMA).  Constitutional:      Appearance: Normal appearance.  HENT:     Right Ear: Tympanic membrane, ear canal and external ear normal.     Left Ear: Tympanic membrane, ear canal and external ear normal.     Mouth/Throat:     Mouth: Mucous membranes are moist.     Pharynx: Oropharynx is clear.  Eyes:     Extraocular Movements: Extraocular movements intact.     Pupils: Pupils are equal, round, and reactive to light.  Cardiovascular:     Rate and Rhythm: Normal rate and regular rhythm.     Pulses: Normal pulses.  Heart sounds: Normal heart sounds.  Pulmonary:     Effort: Pulmonary effort is normal.     Breath sounds: Normal breath sounds.  Abdominal:     General: Bowel sounds are normal. There is no distension.     Palpations: There is no mass.     Tenderness: There is no abdominal tenderness.     Hernia: No hernia is present. There is no hernia in the left inguinal area or right inguinal area.  Genitourinary:    Penis: Normal.      Testes: Normal.     Epididymis:     Right: Normal.     Left: Normal.  Musculoskeletal:     Right lower leg: No edema.     Left lower leg: No edema.  Lymphadenopathy:     Cervical: No cervical adenopathy.     Lower Body: No right inguinal  adenopathy. No left inguinal adenopathy.  Skin:    General: Skin is warm.  Neurological:     General: No focal deficit present.     Mental Status: He is alert.     Deep Tendon Reflexes:     Reflex Scores:      Bicep reflexes are 2+ on the right side and 2+ on the left side.      Patellar reflexes are 2+ on the right side and 2+ on the left side.    Comments: Bilateral upper and lower extremity strength 5/5  Psychiatric:        Mood and Affect: Mood normal.        Behavior: Behavior normal.        Thought Content: Thought content normal.        Judgment: Judgment normal.    Diabetic Foot Form - Detailed   Diabetic Foot Exam - detailed Diabetic Foot exam was performed with the following findings: Yes 08/07/2022  8:51 AM  Visual Foot Exam completed.: Yes  Is there swelling or and abnormal foot shape?: No Is there a claw toe deformity?: No Is there elevated skin temparature?: No Pulse Foot Exam completed.: Yes   Right posterior Tibialias: Present Left posterior Tibialias: Present   Right Dorsalis Pedis: Present Left Dorsalis Pedis: Present  Sensory Foot Exam Completed.: Yes Semmes-Weinstein Monofilament Test   Comments: All 10 sites tested bilaterally sensation intact     Results for orders placed or performed in visit on 08/07/22  Hemoglobin A1c  Result Value Ref Range   Hemoglobin A1C 10.2       The ASCVD Risk score (Arnett DK, et al., 2019) failed to calculate for the following reasons:   The 2019 ASCVD risk score is only valid for ages 16 to 50    Assessment & Plan:   Problem List Items Addressed This Visit       Endocrine   Hypothyroidism   Uncontrolled type 1 diabetes mellitus with hyperglycemia (HCC)    Patient currently on long-acting and sliding scale insulin.  He is currently being followed by endocrinology.  Per patient report they are discussing switching him to an insulin pump.  Continue following with endocrinology as recommended taking insulin as  prescribed.  Will do urine micro today along with detailed foot exam      Relevant Orders   Microalbumin / creatinine urine ratio   CBC   Comprehensive metabolic panel   Lipid panel   Hyperlipidemia due to type 1 diabetes mellitus (HCC)    Patient currently maintained on rosuvastatin 5 mg.  Pending lipid panel today  Relevant Orders   Lipid panel     Other   Preventative health care - Primary    Discussed age-appropriate immunizations and screening exams.  Did review patient's personal, surgical, social, family histories.  Patient is up-to-date on all age-appropriate vaccinations.  Patient is too young for CRC screening or prostate cancer screening.  Patient was given information at discharge about preventative healthcare maintenance with anticipatory guidance.      Relevant Orders   CBC   Comprehensive metabolic panel   Asymptomatic HIV infection (HCC)    Patient is followed by ID and currently on Triumeq continue medication as prescribed continue following with specialist as recommended      Attention and concentration deficit    Patient currently maintained on Vyvanse 30 mg daily seems to tolerate it well.  Continue medication as prescribed      GAD (generalized anxiety disorder)    Patient currently maintained on bupropion and buspirone.  Does still have some difficulty getting to sleep at night we will add on hydroxyzine 25 mg nightly as needed sedation precautions reviewed.      Relevant Medications   hydrOXYzine (VISTARIL) 25 MG capsule   Decreased libido    Patient states he had decreased libido as of late.  States that his brother has low testosterone and gets checked pending testosterone level today      Relevant Orders   Testosterone    Return in about 6 months (around 02/07/2023) for ADD/ADHD medication recheck .    Audria Nine, NP

## 2022-08-07 NOTE — Assessment & Plan Note (Signed)
Discussed age-appropriate immunizations and screening exams.  Did review patient's personal, surgical, social, family histories.  Patient is up-to-date on all age-appropriate vaccinations.  Patient is too young for CRC screening or prostate cancer screening.  Patient was given information at discharge about preventative healthcare maintenance with anticipatory guidance.

## 2022-08-07 NOTE — Assessment & Plan Note (Signed)
Patient states he had decreased libido as of late.  States that his brother has low testosterone and gets checked pending testosterone level today

## 2022-08-07 NOTE — Assessment & Plan Note (Signed)
Patient is followed by ID and currently on Triumeq continue medication as prescribed continue following with specialist as recommended

## 2022-08-09 ENCOUNTER — Encounter: Payer: Self-pay | Admitting: Nurse Practitioner

## 2022-08-09 MED ORDER — ROSUVASTATIN CALCIUM 5 MG PO TABS: 5.0000 mg | ORAL_TABLET | Freq: Every day | ORAL | 3 refills | Status: AC

## 2022-08-30 ENCOUNTER — Other Ambulatory Visit (HOSPITAL_COMMUNITY): Payer: Self-pay

## 2022-08-30 ENCOUNTER — Telehealth: Payer: Self-pay | Admitting: Pharmacy Technician

## 2022-08-30 NOTE — Telephone Encounter (Signed)
[facility] Patient Product/process development scientist completed.    The patient is insured through [employer] and has a $45 copay for [employer]. $0 for Trumeq after $70 evoucher..  We will continue to follow to see if copay assistance is needed.

## 2022-08-31 ENCOUNTER — Other Ambulatory Visit: Payer: Self-pay | Admitting: Nurse Practitioner

## 2022-08-31 DIAGNOSIS — F411 Generalized anxiety disorder: Secondary | ICD-10-CM

## 2022-09-03 ENCOUNTER — Other Ambulatory Visit: Payer: Self-pay

## 2022-09-03 ENCOUNTER — Other Ambulatory Visit (HOSPITAL_COMMUNITY): Payer: Self-pay

## 2022-09-03 ENCOUNTER — Encounter: Payer: Self-pay | Admitting: Family

## 2022-09-03 ENCOUNTER — Ambulatory Visit: Payer: BC Managed Care – PPO | Admitting: Family

## 2022-09-03 VITALS — BP 131/89 | HR 105 | Temp 97.6°F | Ht 71.0 in | Wt 256.0 lb

## 2022-09-03 DIAGNOSIS — Z0000 Encounter for general adult medical examination without abnormal findings: Secondary | ICD-10-CM

## 2022-09-03 DIAGNOSIS — Z113 Encounter for screening for infections with a predominantly sexual mode of transmission: Secondary | ICD-10-CM

## 2022-09-03 DIAGNOSIS — E1165 Type 2 diabetes mellitus with hyperglycemia: Secondary | ICD-10-CM

## 2022-09-03 DIAGNOSIS — Z21 Asymptomatic human immunodeficiency virus [HIV] infection status: Secondary | ICD-10-CM | POA: Diagnosis not present

## 2022-09-03 DIAGNOSIS — Z794 Long term (current) use of insulin: Secondary | ICD-10-CM | POA: Diagnosis not present

## 2022-09-03 MED ORDER — DOVATO 50-300 MG PO TABS: 1.0000 | ORAL_TABLET | Freq: Every day | ORAL | 5 refills | Status: DC

## 2022-09-03 NOTE — Patient Instructions (Addendum)
Nice to see you.  We will check your lab work today.  Continue to take your medication daily as prescribed.  Refills have been sent to the pharmacy.  Plan for follow up in 1 month with pharmacy team and then with Tammy Sours in 4 months or sooner if needed with lab work on the same day.

## 2022-09-03 NOTE — Progress Notes (Unsigned)
Brief Narrative   Patient ID: David Leon, male    DOB: 1991/01/15, 32 y.o.   MRN: [id number]  David Leon is a 32 y/o male caucasian male diagnosed with HIV-1 disease 04/08/17 with risk factor of MSM. Initial viral load was 64,000 with CD4 count of 774. Gentotyping not available. Entered care at [facility] Stage 1. No history of opportunistic infection. Quantiferon Gold and [id number] negative. ART experienced with Triumeq.   Subjective:    Chief Complaint  Patient presents with   New Patient (Initial Visit)   HIV Positive/AIDS    HPI:  David Leon is a 32 y.o. male with Type 1 diabates and HIV disease presenting today to transfer care for HIV-1 disease from [facility].   David Leon was initially diagnosed with HIV disease in March of 2019 with risk factor of MSM. Initial viral load was 64,000 with CD4 count 774. [id number] and Quantiferon Gold negative. ART experienced with Triumeq. Last lab work from [facility] from November 2023 with well controlled virus and good adherence and tolerance to Triumeq. Continues to take Triumeq as prescribed with no adverse side effects or problems obtaining medication. Housing is stable and has good access to food. 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 Follows with Audria Nine, NP for Primary Care. Condoms and STD testing offered.  Denies fevers, chills, night sweats, headaches, changes in vision, neck pain/stiffness, nausea, diarrhea, vomiting, lesions or rashes.    Allergies  Allergen Reactions   Penicillins Other (See Comments)    Has not had a reaction, just family history of same Has patient had a PCN reaction causing immediate rash, facial/tongue/throat swelling, SOB or lightheadedness with hypotension: No Has patient had a PCN reaction causing severe rash involving mucus membranes or skin necrosis: No Has patient had a PCN reaction that required hospitalization: No Has patient had a PCN reaction occurring within the last  10 years: No If all of the above answers are "NO", then may proceed with Cephalosporin use.       Outpatient Medications Prior to Visit  Medication Sig Dispense Refill   BAQSIMI ONE PACK 3 MG/DOSE POWD Place 1 each into both nostrils daily.     buPROPion (WELLBUTRIN XL) 300 MG 24 hr tablet Take 1 tablet by mouth once daily 90 tablet 1   busPIRone (BUSPAR) 5 MG tablet Take 1 tablet by mouth once daily 90 tablet 1   ciclopirox (PENLAC) 8 % solution Apply topically at bedtime. Apply over nail and surrounding skin. Apply daily over previous coat. After seven (7) days, may remove with alcohol and continue cycle. 6.6 mL 0   Continuous Blood Gluc Receiver (FREESTYLE LIBRE 14 DAY READER) DEVI 1 Units by Does not apply route daily as needed. 1 Device 0   Continuous Blood Gluc Sensor (FREESTYLE LIBRE 14 DAY SENSOR) MISC 1 Units by Does not apply route daily as needed. 6 each 3   glucagon 1 MG injection Inject 1 mg into the muscle once as needed. 1 each 12   hydrOXYzine (VISTARIL) 25 MG capsule TAKE 1 CAPSULE BY MOUTH AT BEDTIME AS NEEDED 30 capsule 0   Insulin Pen Needle 31G X 5 MM MISC 40 Units by Does not apply route at bedtime. 100 each 2   levothyroxine (SYNTHROID) 200 MCG tablet TAKE 1 TABLET BY MOUTH ONCE DAILY BEFORE BREAKFAST 90 tablet 1   lisdexamfetamine (VYVANSE) 30 MG capsule Take 1 capsule (30 mg total) by mouth daily. 30 capsule 0  Multiple Vitamins-Minerals (MULTIVITAMIN ADULT PO) Take 1 tablet by mouth daily.     NOVOLOG FLEXPEN 100 UNIT/ML FlexPen As directed with sliding scale 15 mL 1   phentermine (ADIPEX-P) 37.5 MG tablet Take by mouth.     rosuvastatin (CRESTOR) 5 MG tablet Take 1 tablet (5 mg total) by mouth daily. 90 tablet 3   TOUJEO MAX SOLOSTAR 300 UNIT/ML Solostar Pen Inject 30 Units into the skin daily.     abacavir-dolutegravir-lamiVUDine (TRIUMEQ) 600-50-300 MG tablet Take 1 tablet by mouth daily after breakfast.      lisdexamfetamine (VYVANSE) 30 MG capsule Take 1  capsule (30 mg total) by mouth daily. (Patient not taking: Reported on 09/03/2022) 30 capsule 0   lisdexamfetamine (VYVANSE) 30 MG capsule Take 1 capsule (30 mg total) by mouth daily. NEED OFFICE VISIT FOR FURTHER REFILLS (Patient not taking: Reported on 09/03/2022) 30 capsule 0   No facility-administered medications prior to visit.     Past Medical History:  Diagnosis Date   Diabetes mellitus without complication (HCC)    Diabetic ketoacidosis without coma associated with type 1 diabetes mellitus (HCC)    HIV infection (HCC)    Hyperlipidemia 09/11/2014   Thrombocytopenia (HCC) 09/10/2014   Thyroid disease      History reviewed. No pertinent surgical history.    Review of Systems  Constitutional:  Negative for appetite change, chills, fatigue, fever and unexpected weight change.  Eyes:  Negative for visual disturbance.  Respiratory:  Negative for cough, chest tightness, shortness of breath and wheezing.   Cardiovascular:  Negative for chest pain and leg swelling.  Gastrointestinal:  Negative for abdominal pain, constipation, diarrhea, nausea and vomiting.  Genitourinary:  Negative for dysuria, flank pain, frequency, genital sores, hematuria and urgency.  Skin:  Negative for rash.  Allergic/Immunologic: Negative for immunocompromised state.  Neurological:  Negative for dizziness and headaches.      Objective:    BP 131/89   Pulse (!) 105   Temp 97.6 F (36.4 C) (Temporal)   Ht 5\' 11"  (1.803 m)   Wt 256 lb (116.1 kg)   SpO2 100%   BMI 35.70 kg/m  Nursing note and vital signs reviewed.  Physical Exam Constitutional:      General: He is not in acute distress.    Appearance: He is well-developed.  Eyes:     Conjunctiva/sclera: Conjunctivae normal.  Cardiovascular:     Rate and Rhythm: Normal rate and regular rhythm.     Heart sounds: Normal heart sounds. No murmur heard.    No friction rub. No gallop.  Pulmonary:     Effort: Pulmonary effort is normal. No respiratory  distress.     Breath sounds: Normal breath sounds. No wheezing or rales.  Chest:     Chest wall: No tenderness.  Abdominal:     General: Bowel sounds are normal.     Palpations: Abdomen is soft.     Tenderness: There is no abdominal tenderness.  Musculoskeletal:     Cervical back: Neck supple.  Lymphadenopathy:     Cervical: No cervical adenopathy.  Skin:    General: Skin is warm and dry.     Findings: No rash.  Neurological:     Mental Status: He is alert and oriented to person, place, and time.  Psychiatric:        Behavior: Behavior normal.        Thought Content: Thought content normal.        Judgment: Judgment normal.  09/03/2022    3:03 PM 08/07/2022    8:50 AM 02/05/2022    4:19 PM 11/30/2020    9:40 AM 12/30/2019    9:16 AM  Depression screen PHQ 2/9  Decreased Interest 0 0 0 1 0  Down, Depressed, Hopeless 0 0 0 0 0  PHQ - 2 Score 0 0 0 1 0  Altered sleeping  3 0 2   Tired, decreased energy  1 0 3   Change in appetite  0 0 2   Feeling bad or failure about yourself   0 0 0   Trouble concentrating  0 1 1   Moving slowly or fidgety/restless  0 0 1   Suicidal thoughts  0 0 0   PHQ-9 Score  4 1 10    Difficult doing work/chores  Not difficult at all Not difficult at all Somewhat difficult        Assessment & Plan:    Patient Active Problem List   Diagnosis Date Noted   Decreased libido 08/07/2022   Elevated blood pressure reading without diagnosis of hypertension 02/05/2022   GAD (generalized anxiety disorder) 11/30/2020   Class 2 severe obesity due to excess calories with serious comorbidity and body mass index (BMI) of 36.0 to 36.9 in adult [facility]) 12/31/2019   Hyperlipidemia due to type 1 diabetes mellitus (HCC) 10/02/2018   Attention and concentration deficit 05/30/2018   Uncontrolled type 1 diabetes mellitus with hyperglycemia (HCC) 05/30/2018   Asymptomatic HIV infection (HCC) 10/11/2017   Dehydration    DKA, type 1 (HCC) 10/06/2017   Type 1  diabetes mellitus without complications (HCC) 04/08/2017   Preventative health care 02/24/2016   Diabetes (HCC) 09/23/2014   Hyperlipidemia 09/11/2014   Fluid volume depletion 09/10/2014   Hypothyroidism 09/10/2014   Hyponatremia 09/10/2014   Diabetic ketoacidosis without coma associated with type 1 diabetes mellitus (HCC)      Problem List Items Addressed This Visit       Endocrine   Diabetes (HCC)    Diabetes remains poorly controlled with A1c of 10.0. Reviewed increased risk for cardiovascular and renal disease in the presence of HIV and diabetes. Fortunately HIV remains well controlled which should help to alleviate some risk in addition to the rosuvastatin.         Other   Preventative health care    Discussed importance of safe sexual practice and condom use. Condoms and STD testing offered.  Reviewed vaccinations - do for Menveo. Will discuss with primary care.  Encouraged to complete routine dental care and can refer to [facility] if needed.       Asymptomatic HIV infection (HCC) - Primary    David Leon continues to have well controlled virus with good adherence and tolerance to Triumeq. Reviewed previous lab work and discussed plan of care, clinic introduction, and U equals U. Discussed HIV regimens decided to narrow Triumeq down to Dovato given well controlled status. Check lab work. Plan for follow up with pharmacy provider in 1 month to ensure continued viral control and good tolerance. Plan for follow up in 4 months or sooner if needed with lab work on the same day.       Relevant Medications   dolutegravir-lamiVUDine (DOVATO) 50-300 MG tablet   Other Relevant Orders   COMPLETE METABOLIC PANEL WITH GFR (Completed)   T-helper cell (CD4)- ([facility] clinic only) (Completed)   HIV-1 RNA quant-no reflex-bld   Other Visit Diagnoses     Screening for STDs (sexually transmitted diseases)  Relevant Orders   RPR (Completed)   Healthcare maintenance            I have  discontinued Quita Skye. David "Drake"'s Triumeq. I am also having him start on Dovato. Additionally, I am having him maintain his glucagon, Insulin Pen Needle, FreeStyle Libre 14 Day Reader, Multiple Vitamins-Minerals (MULTIVITAMIN ADULT PO), FreeStyle Libre 14 Day Sensor, NovoLOG FlexPen, Baqsimi One Pack, [employer], levothyroxine, buPROPion, ciclopirox, lisdexamfetamine, phentermine, rosuvastatin, busPIRone, and hydrOXYzine.   Meds ordered this encounter  Medications   dolutegravir-lamiVUDine (DOVATO) 50-300 MG tablet    Sig: Take 1 tablet by mouth daily.    Dispense:  30 tablet    Refill:  5    Order Specific Question:   Supervising Provider    Answer:   Judyann Munson [[id number]]     Follow-up: Return in about 4 months (around 01/03/2023), or if symptoms worsen or fail to improve.   Marcos Eke, MSN, FNP-C Nurse Practitioner [facility] for [facility]ious Disease [facility] Main number: [phone]

## 2022-09-04 ENCOUNTER — Encounter: Payer: Self-pay | Admitting: Family

## 2022-09-04 LAB — COMPLETE METABOLIC PANEL WITH GFR
AG Ratio: 1.8 (calc) (ref 1.0–2.5)
ALT: 14 U/L (ref 9–46)
AST: 11 U/L (ref 10–40)
Albumin: 4.5 g/dL (ref 3.6–5.1)
Alkaline phosphatase (APISO): 50 U/L (ref 36–130)
BUN: 11 mg/dL (ref 7–25)
CO2: 27 mmol/L (ref 20–32)
Calcium: 9.3 mg/dL (ref 8.6–10.3)
Chloride: 100 mmol/L (ref 98–110)
Creat: 0.87 mg/dL (ref 0.60–1.26)
Globulin: 2.5 g/dL (calc) (ref 1.9–3.7)
Glucose, Bld: 293 mg/dL — ABNORMAL HIGH (ref 65–99)
Potassium: 4.1 mmol/L (ref 3.5–5.3)
Sodium: 135 mmol/L (ref 135–146)
Total Bilirubin: 0.7 mg/dL (ref 0.2–1.2)
Total Protein: 7 g/dL (ref 6.1–8.1)
eGFR: 118 mL/min/{1.73_m2} (ref >=60)

## 2022-09-04 NOTE — Assessment & Plan Note (Signed)
Diabetes remains poorly controlled with A1c of 10.0. Reviewed increased risk for cardiovascular and renal disease in the presence of HIV and diabetes. Fortunately HIV remains well controlled which should help to alleviate some risk in addition to the rosuvastatin.

## 2022-09-04 NOTE — Assessment & Plan Note (Signed)
Discussed importance of safe sexual practice and condom use. Condoms and STD testing offered.  Reviewed vaccinations - do for Menveo. Will discuss with primary care.  Encouraged to complete routine dental care and can refer to [facility] if needed.

## 2022-09-04 NOTE — Assessment & Plan Note (Signed)
David Leon continues to have well controlled virus with good adherence and tolerance to Triumeq. Reviewed previous lab work and discussed plan of care, clinic introduction, and U equals U. Discussed HIV regimens decided to narrow Triumeq down to Dovato given well controlled status. Check lab work. Plan for follow up with pharmacy provider in 1 month to ensure continued viral control and good tolerance. Plan for follow up in 4 months or sooner if needed with lab work on the same day.

## 2022-09-06 ENCOUNTER — Other Ambulatory Visit: Payer: Self-pay | Admitting: Pharmacist

## 2022-09-06 DIAGNOSIS — Z21 Asymptomatic human immunodeficiency virus [HIV] infection status: Secondary | ICD-10-CM

## 2022-09-06 MED ORDER — DOVATO 50-300 MG PO TABS
1.0000 | ORAL_TABLET | Freq: Every day | ORAL | Status: AC
Start: 2022-09-03 — End: 2022-09-17

## 2022-09-25 ENCOUNTER — Other Ambulatory Visit: Payer: Self-pay | Admitting: Nurse Practitioner

## 2022-09-25 DIAGNOSIS — R41840 Attention and concentration deficit: Secondary | ICD-10-CM

## 2022-09-25 NOTE — Telephone Encounter (Signed)
Please see the MyChart message reply(ies) for my assessment and plan.    This patient gave consent for this Medical Advice Message and is aware that it may result in a bill to [employer], as well as the possibility of receiving a bill for a co-payment or deductible. They are an established patient, but are not seeking medical advice exclusively about a problem treated during an in person or video visit in the last seven days. I did not recommend an in person or video visit within seven days of my reply.    I spent a total of 8 minutes cumulative time within 7 days through [employer].  Jeanine Luz, FNP

## 2022-09-26 ENCOUNTER — Other Ambulatory Visit: Payer: Self-pay | Admitting: Nurse Practitioner

## 2022-09-26 DIAGNOSIS — R41840 Attention and concentration deficit: Secondary | ICD-10-CM

## 2022-09-26 MED ORDER — LISDEXAMFETAMINE DIMESYLATE 30 MG PO CAPS: 30.0000 mg | ORAL_CAPSULE | Freq: Every day | ORAL | 0 refills | Status: DC

## 2022-09-26 MED ORDER — LISDEXAMFETAMINE DIMESYLATE 30 MG PO CAPS
30.0000 mg | ORAL_CAPSULE | Freq: Every day | ORAL | 0 refills | Status: DC
Start: 2022-09-26 — End: 2023-02-07

## 2022-10-04 ENCOUNTER — Ambulatory Visit (INDEPENDENT_AMBULATORY_CARE_PROVIDER_SITE_OTHER): Payer: BC Managed Care – PPO | Admitting: Pharmacist

## 2022-10-04 ENCOUNTER — Other Ambulatory Visit: Payer: Self-pay

## 2022-10-04 DIAGNOSIS — B20 Human immunodeficiency virus [HIV] disease: Secondary | ICD-10-CM

## 2022-10-04 DIAGNOSIS — Z21 Asymptomatic human immunodeficiency virus [HIV] infection status: Secondary | ICD-10-CM | POA: Diagnosis not present

## 2022-10-04 NOTE — Progress Notes (Signed)
10/04/2022  HPI: David Leon is a 32 y.o. male who presents to the [facility] pharmacy clinic for HIV follow-up.  Patient Active Problem List   Diagnosis Date Noted   Decreased libido 08/07/2022   Elevated blood pressure reading without diagnosis of hypertension 02/05/2022   GAD (generalized anxiety disorder) 11/30/2020   Class 2 severe obesity due to excess calories with serious comorbidity and body mass index (BMI) of 36.0 to 36.9 in adult [facility]) 12/31/2019   Hyperlipidemia due to type 1 diabetes mellitus (HCC) 10/02/2018   Attention and concentration deficit 05/30/2018   Uncontrolled type 1 diabetes mellitus with hyperglycemia (HCC) 05/30/2018   Asymptomatic HIV infection (HCC) 10/11/2017   Dehydration    DKA, type 1 (HCC) 10/06/2017   Type 1 diabetes mellitus without complications (HCC) 04/08/2017   Preventative health care 02/24/2016   Diabetes (HCC) 09/23/2014   Hyperlipidemia 09/11/2014   Fluid volume depletion 09/10/2014   Hypothyroidism 09/10/2014   Hyponatremia 09/10/2014   Diabetic ketoacidosis without coma associated with type 1 diabetes mellitus (HCC)     Patient's Medications  New Prescriptions   No medications on file  Previous Medications   BAQSIMI ONE PACK 3 MG/DOSE POWD    Place 1 each into both nostrils daily.   BUPROPION (WELLBUTRIN XL) 300 MG 24 HR TABLET    Take 1 tablet by mouth once daily   BUSPIRONE (BUSPAR) 5 MG TABLET    Take 1 tablet by mouth once daily   CICLOPIROX (PENLAC) 8 % SOLUTION    Apply topically at bedtime. Apply over nail and surrounding skin. Apply daily over previous coat. After seven (7) days, may remove with alcohol and continue cycle.   CONTINUOUS BLOOD GLUC RECEIVER (FREESTYLE LIBRE 14 DAY READER) DEVI    1 Units by Does not apply route daily as needed.   CONTINUOUS BLOOD GLUC SENSOR (FREESTYLE LIBRE 14 DAY SENSOR) MISC    1 Units by Does not apply route daily as needed.   DOLUTEGRAVIR-LAMIVUDINE (DOVATO) 50-300 MG TABLET    Take 1  tablet by mouth daily.   GLUCAGON 1 MG INJECTION    Inject 1 mg into the muscle once as needed.   HYDROXYZINE (VISTARIL) 25 MG CAPSULE    TAKE 1 CAPSULE BY MOUTH AT BEDTIME AS NEEDED   INSULIN PEN NEEDLE 31G X 5 MM MISC    40 Units by Does not apply route at bedtime.   LEVOTHYROXINE (SYNTHROID) 200 MCG TABLET    TAKE 1 TABLET BY MOUTH ONCE DAILY BEFORE BREAKFAST   LISDEXAMFETAMINE (VYVANSE) 30 MG CAPSULE    Take 1 capsule (30 mg total) by mouth daily.   LISDEXAMFETAMINE (VYVANSE) 30 MG CAPSULE    Take 1 capsule (30 mg total) by mouth daily.   LISDEXAMFETAMINE (VYVANSE) 30 MG CAPSULE    Take 1 capsule (30 mg total) by mouth daily.   MULTIPLE VITAMINS-MINERALS (MULTIVITAMIN ADULT PO)    Take 1 tablet by mouth daily.   NOVOLOG FLEXPEN 100 UNIT/ML FLEXPEN    As directed with sliding scale   PHENTERMINE (ADIPEX-P) 37.5 MG TABLET    Take by mouth.   ROSUVASTATIN (CRESTOR) 5 MG TABLET    Take 1 tablet (5 mg total) by mouth daily.   TOUJEO MAX SOLOSTAR 300 UNIT/ML SOLOSTAR PEN    Inject 30 Units into the skin daily.  Modified Medications   No medications on file  Discontinued Medications   No medications on file    Labs: Lab Results  Component Value  Date   HIV1RNAQUANT Not Detected 09/03/2022   HIV1RNAQUANT 64,000 (H) 04/15/2017   CD4TABS 781 09/03/2022    RPR and STI Lab Results  Component Value Date   LABRPR NON-REACTIVE 09/03/2022   LABRPR NON-REACTIVE 04/08/2017        No data to display          Hepatitis B No results found for: "HEPBSAB", "HEPBSAG", "HEPBCAB" Hepatitis C No results found for: "HEPCAB", "HCVRNAPCRQN" Hepatitis A No results found for: "HAV" Lipids: Lab Results  Component Value Date   CHOL 194 08/07/2022   TRIG 152.0 (H) 08/07/2022   HDL 36.90 (L) 08/07/2022   CHOLHDL 5 08/07/2022   VLDL 30.4 08/07/2022   LDLCALC 127 (H) 08/07/2022    Current HIV Regimen: Dovato  Assessment: David Leon is here today for a 4 week follow up after changing his ART  from Triumeq to Dovato. He continues to do well after the change and has had no issues or side effects with Dovato so far. No issues paying for it or getting it from the pharmacy. He likes that it is a smaller tablet as well. Asked him if he is interested in getting the flu or COVID vaccines and he will get them at his PCP's office. Will check a HIV RNA to ensure he remains undetectable with the medication switch.   Plan: - Continue Dovato once daily - HIV RNA today - Follow up with Tammy Sours on 12/24/22  Johnelle Tafolla L. Eric Nees, PharmD, BCIDP, AAHIVP, CPP Clinical Pharmacist Practitioner Infectious Diseases Clinical Pharmacist [facility] 10/04/2022, 3:37 PM

## 2022-10-06 LAB — HIV-1 RNA QUANT-NO REFLEX-BLD
HIV 1 RNA Quant: NOT DETECTED {copies}/mL
HIV-1 RNA Quant, Log: NOT DETECTED {Log_copies}/mL

## 2022-10-10 ENCOUNTER — Other Ambulatory Visit: Payer: Self-pay | Admitting: Nurse Practitioner

## 2022-10-10 DIAGNOSIS — F411 Generalized anxiety disorder: Secondary | ICD-10-CM

## 2022-10-25 ENCOUNTER — Encounter: Payer: Self-pay | Admitting: Nurse Practitioner

## 2022-11-02 ENCOUNTER — Other Ambulatory Visit: Payer: Self-pay | Admitting: Nurse Practitioner

## 2022-11-02 DIAGNOSIS — F411 Generalized anxiety disorder: Secondary | ICD-10-CM

## 2022-11-29 ENCOUNTER — Other Ambulatory Visit: Payer: Self-pay | Admitting: Nurse Practitioner

## 2022-11-29 DIAGNOSIS — E039 Hypothyroidism, unspecified: Secondary | ICD-10-CM

## 2022-12-10 ENCOUNTER — Other Ambulatory Visit: Payer: Self-pay | Admitting: Nurse Practitioner

## 2022-12-10 DIAGNOSIS — F411 Generalized anxiety disorder: Secondary | ICD-10-CM

## 2022-12-10 NOTE — Telephone Encounter (Signed)
Patient has 6 mo f/u for adhd as requested at last cpe scheduled

## 2022-12-24 ENCOUNTER — Ambulatory Visit: Payer: Self-pay | Admitting: Family

## 2023-01-10 ENCOUNTER — Other Ambulatory Visit: Payer: Self-pay | Admitting: Nurse Practitioner

## 2023-01-10 DIAGNOSIS — R41840 Attention and concentration deficit: Secondary | ICD-10-CM

## 2023-01-11 MED ORDER — LISDEXAMFETAMINE DIMESYLATE 30 MG PO CAPS: 30.0000 mg | ORAL_CAPSULE | Freq: Every day | ORAL | 0 refills | Status: DC

## 2023-01-24 ENCOUNTER — Ambulatory Visit: Payer: BC Managed Care – PPO | Admitting: Family

## 2023-02-05 ENCOUNTER — Other Ambulatory Visit: Payer: Self-pay | Admitting: Nurse Practitioner

## 2023-02-05 DIAGNOSIS — F411 Generalized anxiety disorder: Secondary | ICD-10-CM

## 2023-02-06 ENCOUNTER — Other Ambulatory Visit: Payer: Self-pay | Admitting: Nurse Practitioner

## 2023-02-06 DIAGNOSIS — F411 Generalized anxiety disorder: Secondary | ICD-10-CM

## 2023-02-07 ENCOUNTER — Ambulatory Visit: Payer: 59 | Admitting: Nurse Practitioner

## 2023-02-07 VITALS — BP 118/88 | HR 96 | Temp 98.3°F | Ht 71.0 in | Wt 276.0 lb

## 2023-02-07 DIAGNOSIS — R41840 Attention and concentration deficit: Secondary | ICD-10-CM

## 2023-02-07 DIAGNOSIS — E039 Hypothyroidism, unspecified: Secondary | ICD-10-CM | POA: Diagnosis not present

## 2023-02-07 DIAGNOSIS — F411 Generalized anxiety disorder: Secondary | ICD-10-CM | POA: Diagnosis not present

## 2023-02-07 LAB — T4, FREE: Free T4: 1.25 ng/dL (ref 0.60–1.60)

## 2023-02-07 LAB — TSH: TSH: 10.96 u[IU]/mL — ABNORMAL HIGH (ref 0.35–5.50)

## 2023-02-07 LAB — T3, FREE: T3, Free: 3.5 pg/mL (ref 2.3–4.2)

## 2023-02-07 MED ORDER — LISDEXAMFETAMINE DIMESYLATE 30 MG PO CAPS: 30.0000 mg | ORAL_CAPSULE | Freq: Every day | ORAL | 0 refills | Status: DC

## 2023-02-07 MED ORDER — HYDROXYZINE PAMOATE 25 MG PO CAPS: 25.0000 mg | ORAL_CAPSULE | Freq: Every evening | ORAL | 0 refills | Status: DC | PRN

## 2023-02-07 MED ORDER — LEVOTHYROXINE SODIUM 200 MCG PO TABS: 200.0000 ug | ORAL_TABLET | Freq: Every day | ORAL | 1 refills | Status: AC

## 2023-02-07 NOTE — Patient Instructions (Signed)
Nice to see you today I will be in touch with the labs once I have them Follow up with me in 6 months for your physical and full panel of labs, sooner if you need me

## 2023-02-07 NOTE — Assessment & Plan Note (Signed)
Patient currently maintained on bupropion, BuSpar and hydroxyzine as needed.  Continue medication as prescribed patient denies HI/SI/AVH.

## 2023-02-07 NOTE — Assessment & Plan Note (Signed)
Patient currently maintained on Vyvanse 30 mg daily.  PDMP reviewed.  No red flags continue medication as prescribed refills provided today

## 2023-02-07 NOTE — Assessment & Plan Note (Signed)
History of the same.  Last TSH outside of normal limits.  Patient currently on levothyroxine 200 mcg daily.  Pending TSH today

## 2023-02-07 NOTE — Progress Notes (Signed)
Established Patient Office Visit  Subjective   Patient ID: David Leon, male    DOB: [birth year]  Age: 33 y.o. MRN: [id number]  Chief Complaint  Patient presents with   Medication Management    Pt states that he has been maintaining well on Vyvanse.     HPI  ADD/ADHD: patient is currenlty maintained on vyvanse 30mg  daily.  He is tolerating medication well and seeing good benefit.  Patient denies palpitations, insomnia, unintentional weight loss.   GAD: he is currently on welbutrion, buspar, ad hydroxyzine as needed. States that he will take it druing the week 2-3 times during the week. States that he will sleep throught the night and can go to sleep.  He has noticed that sometimes if he is taking more than 25 mg of hydroxyzine and he does feel groggy in the mornings. Go to bed 10-630 and feels rested      Review of Systems  Constitutional:  Negative for chills and fever.  Respiratory:  Negative for shortness of breath.   Cardiovascular:  Negative for chest pain.  Neurological:  Negative for dizziness and headaches.  Psychiatric/Behavioral:  Negative for hallucinations and suicidal ideas.       Objective:     BP 118/88   Pulse 96   Temp 98.3 F (36.8 C) (Oral)   Ht 5\' 11"  (1.803 m)   Wt 276 lb (125.2 kg)   SpO2 98%   BMI 38.49 kg/m  BP Readings from Last 3 Encounters:  02/07/23 118/88  09/03/22 131/89  08/07/22 126/62   Wt Readings from Last 3 Encounters:  02/07/23 276 lb (125.2 kg)  09/03/22 256 lb (116.1 kg)  08/07/22 260 lb (117.9 kg)   SpO2 Readings from Last 3 Encounters:  02/07/23 98%  09/03/22 100%  08/07/22 99%      Physical Exam Vitals and nursing note reviewed.  Constitutional:      Appearance: Normal appearance.  Cardiovascular:     Rate and Rhythm: Normal rate and regular rhythm.     Heart sounds: Normal heart sounds.  Pulmonary:     Effort: Pulmonary effort is normal.     Breath sounds: Normal breath sounds.  Neurological:      Mental Status: He is alert.      No results found for any visits on 02/07/23.    The ASCVD Risk score (Arnett DK, et al., 2019) failed to calculate for the following reasons:   The 2019 ASCVD risk score is only valid for ages 7 to 54    Assessment & Plan:   Problem List Items Addressed This Visit       Endocrine   Hypothyroidism   History of the same.  Last TSH outside of normal limits.  Patient currently on levothyroxine 200 mcg daily.  Pending TSH today      Relevant Medications   levothyroxine (SYNTHROID) 200 MCG tablet   Other Relevant Orders   T3, free   T4, free   TSH     Other   Attention and concentration deficit - Primary   Patient currently maintained on Vyvanse 30 mg daily.  PDMP reviewed.  No red flags continue medication as prescribed refills provided today      Relevant Medications   lisdexamfetamine (VYVANSE) 30 MG capsule   lisdexamfetamine (VYVANSE) 30 MG capsule   lisdexamfetamine (VYVANSE) 30 MG capsule   GAD (generalized anxiety disorder)   Patient currently maintained on bupropion, BuSpar and hydroxyzine as needed.  Continue medication as  prescribed patient denies HI/SI/AVH.      Relevant Medications   hydrOXYzine (VISTARIL) 25 MG capsule    Return in about 26 weeks (around 08/08/2023) for CPE and Labs.    Audria Nine, NP

## 2023-02-12 ENCOUNTER — Encounter: Payer: Self-pay | Admitting: Nurse Practitioner

## 2023-02-13 DIAGNOSIS — Z0289 Encounter for other administrative examinations: Secondary | ICD-10-CM

## 2023-02-14 ENCOUNTER — Ambulatory Visit: Payer: 59 | Admitting: Family Medicine

## 2023-02-14 ENCOUNTER — Encounter: Payer: Self-pay | Admitting: Family Medicine

## 2023-02-14 VITALS — BP 132/88 | HR 100 | Temp 92.2°F | Ht 71.0 in | Wt 269.0 lb

## 2023-02-14 DIAGNOSIS — E109 Type 1 diabetes mellitus without complications: Secondary | ICD-10-CM

## 2023-02-14 DIAGNOSIS — Z6837 Body mass index (BMI) 37.0-37.9, adult: Secondary | ICD-10-CM

## 2023-02-14 DIAGNOSIS — F411 Generalized anxiety disorder: Secondary | ICD-10-CM | POA: Diagnosis not present

## 2023-02-14 DIAGNOSIS — E785 Hyperlipidemia, unspecified: Secondary | ICD-10-CM | POA: Diagnosis not present

## 2023-02-14 DIAGNOSIS — Z794 Long term (current) use of insulin: Secondary | ICD-10-CM

## 2023-02-14 DIAGNOSIS — E66812 Obesity, class 2: Secondary | ICD-10-CM | POA: Diagnosis not present

## 2023-02-14 NOTE — Progress Notes (Signed)
Office: [phone]  /  Fax: [phone]   Initial Visit  David Leon was seen in clinic today to evaluate for obesity. He is interested in losing weight to improve overall health and reduce the risk of weight related complications. He presents today to review program treatment options, initial physical assessment, and evaluation.     He was referred by: PCP  When asked what else they would like to accomplish? He states: Adopt healthier eating patterns, Improve existing medical conditions, Improve quality of life, and Improve appearance He would like to improve his diabetes.  He would like to get to 230 lb.  Weight history:weight started to slowly go up 2 years ago.  He adopted his niece 2 years ago.  Energy level is lower.  He is fairly sedentary in [employer].  His niece is 47.    When asked how has your weight affected you? He states: Contributed to medical problems and Having fatigue  Some associated conditions: Other: type I diabetes,  thyroid medicine  Contributing factors: Use of obesogenic medications: Obesogenic diabetes medications, Moderate to high levels of stress, Reduced physical activity, and Frequent travel  Weight promoting medications identified: Obesogenic diabetes medications  recently got an insulin pump and a CGM  Current nutrition plan: None  Current level of physical activity: Walking 15-30 minutes and [employer]  Current or previous pharmacotherapy: Phentermine and Other: on Vyvanse  Response to medication: Had side effects so it was discontinued   Past medical history includes:   Past Medical History:  Diagnosis Date   Diabetes mellitus without complication (HCC)    Diabetic ketoacidosis without coma associated with type 1 diabetes mellitus (HCC)    HIV infection (HCC)    Hyperlipidemia 09/11/2014   Thrombocytopenia (HCC) 09/10/2014   Thyroid disease      Objective:   BP 132/88   Pulse 100   Temp (!) 92.2 F (33.4 C)    Ht 5\' 11"  (1.803 m)   Wt 269 lb (122 kg)   SpO2 100%   BMI 37.52 kg/m  He was weighed on the bioimpedance scale: Body mass index is 37.52 kg/m.  Peak Weight:-- , Body Fat%:34.2, Visceral Fat Rating:16, Weight trend over the last 12 months: Unchanged  General:  Alert, oriented and cooperative. Patient is in no acute distress.  Respiratory: Normal respiratory effort, no problems with respiration noted   Gait: able to ambulate independently  Mental Status: Normal mood and affect. Normal behavior. Normal judgment and thought content.   DIAGNOSTIC DATA REVIEWED:  BMET    Component Value Date/Time   NA 135 09/03/2022 0337   K 4.1 09/03/2022 0337   CL 100 09/03/2022 0337   CO2 27 09/03/2022 0337   GLUCOSE 293 (H) 09/03/2022 0337   BUN 11 09/03/2022 0337   CREATININE 0.87 09/03/2022 0337   CALCIUM 9.3 09/03/2022 0337   GFRNONAA >60 12/06/2017 2136   GFRAA >60 12/06/2017 2136   Lab Results  Component Value Date   HGBA1C 10.2 07/22/2022   HGBA1C 15.0 (H) 09/10/2014   No results found for: "INSULIN" CBC    Component Value Date/Time   WBC 4.4 08/07/2022 0918   RBC 4.65 08/07/2022 0918   HGB 15.1 08/07/2022 0918   HCT 44.4 08/07/2022 0918   PLT 241.0 08/07/2022 0918   MCV 95.5 08/07/2022 0918   MCH 32.2 12/06/2017 2136   MCHC 33.9 08/07/2022 0918   RDW 12.9 08/07/2022 0918   Iron/TIBC/Ferritin/ %Sat No results found for: "IRON", "  TIBC", "FERRITIN", "IRONPCTSAT" Lipid Panel     Component Value Date/Time   CHOL 194 08/07/2022 0918   TRIG 152.0 (H) 08/07/2022 0918   HDL 36.90 (L) 08/07/2022 0918   CHOLHDL 5 08/07/2022 0918   VLDL 30.4 08/07/2022 0918   LDLCALC 127 (H) 08/07/2022 0918   LDLDIRECT 97.0 11/30/2020 0919   Hepatic Function Panel     Component Value Date/Time   PROT 7.0 09/03/2022 0337   ALBUMIN 4.3 08/07/2022 0918   AST 11 09/03/2022 0337   ALT 14 09/03/2022 0337   ALKPHOS 50 08/07/2022 0918   BILITOT 0.7 09/03/2022 0337   BILIDIR 0.1 02/24/2016  0930   IBILI NOT CALCULATED 09/10/2014 0742      Component Value Date/Time   TSH 10.96 (H) 02/07/2023 1130     Assessment and Plan:   Type 1 diabetes mellitus without complications (HCC) Assessment & Plan: Diagnosed with T1DM in college with DKA Blood sugar control has been improving with use of an insulin pump He has met with an RD and has been working on improving food intake but has struggled with hyperphagia, meal planning, stress and traveling for work.  Continue plan of care per PCP/ endo for treatment of T1DM Watch for lowering blood sugar reading with dietary change, exercise and weight reduction    Class 2 severe obesity due to excess calories with serious comorbidity and body mass index (BMI) of 37.0 to 37.9 in adult [facility])  Hyperlipidemia, unspecified hyperlipidemia type Assessment & Plan: Lab Results  Component Value Date   CHOL 194 08/07/2022   HDL 36.90 (L) 08/07/2022   LDLCALC 127 (H) 08/07/2022   LDLDIRECT 97.0 11/30/2020   TRIG 152.0 (H) 08/07/2022   CHOLHDL 5 08/07/2022   He is doing well on rosuvastatin 5 mg daily without adverse SE  Continue current medication. Look for lipid improvements with weight reduction and lifestyle changes   GAD (generalized anxiety disorder) Assessment & Plan: Taking Wellbutrin XL 300 mg daily, Buspar 5 mg daily and Hydroxyzine 25 mg at bedtime prn anxiety Higher stress levels have been a factor in his weight gain over the past few years   Will be working on mindful eating, stress reduction and keeping junk food out of the house Consider adding CBT with Dr Dewaine Conger         Obesity Treatment / Action Plan:  Patient will work on garnering support from family and friends to begin weight loss journey. Will work on eliminating or reducing the presence of highly palatable, calorie dense foods in the home. Will complete provided nutritional and psychosocial assessment questionnaire before the next appointment. Will be  scheduled for indirect calorimetry to determine resting energy expenditure in a fasting state.  This will allow [place] to create a reduced calorie, high-protein meal plan to promote loss of fat mass while preserving muscle mass. Will think about ideas on how to incorporate physical activity into their daily routine. Counseled on the health benefits of losing 5%-15% of total body weight. Was counseled on nutritional approaches to weight loss and benefits of reducing processed foods and consuming plant-based foods and high quality protein as part of nutritional weight management. Was counseled on pharmacotherapy and role as an adjunct in weight management.   Obesity Education Performed Today:  He was weighed on the bioimpedance scale and results were discussed and documented in the synopsis.  We discussed obesity as a disease and the importance of a more detailed evaluation of all the factors contributing to the disease.  We discussed the importance of long term lifestyle changes which include nutrition, exercise and behavioral modifications as well as the importance of customizing this to his specific health and social needs.  We discussed the benefits of reaching a healthier weight to alleviate the symptoms of existing conditions and reduce the risks of the biomechanical, metabolic and psychological effects of obesity.  David Leon appears to be in the action stage of change and states they are ready to start intensive lifestyle modifications and behavioral modifications.  20 minutes was spent today on this visit including the above counseling, pre-visit chart review, and post-visit documentation.  Reviewed by clinician on day of visit: allergies, medications, problem list, medical history, surgical history, family history, social history, and previous encounter notes pertinent to obesity diagnosis.    Seymour Bars, D.O. DABFM, [facility] [address] [place], [place] [address] [phone]

## 2023-02-16 NOTE — Assessment & Plan Note (Signed)
Diagnosed with T1DM in college with DKA Blood sugar control has been improving with use of an insulin pump He has met with an RD and has been working on improving food intake but has struggled with hyperphagia, meal planning, stress and traveling for work.  Continue plan of care per PCP/ endo for treatment of T1DM Watch for lowering blood sugar reading with dietary change, exercise and weight reduction

## 2023-02-16 NOTE — Assessment & Plan Note (Signed)
Lab Results  Component Value Date   CHOL 194 08/07/2022   HDL 36.90 (L) 08/07/2022   LDLCALC 127 (H) 08/07/2022   LDLDIRECT 97.0 11/30/2020   TRIG 152.0 (H) 08/07/2022   CHOLHDL 5 08/07/2022   He is doing well on rosuvastatin 5 mg daily without adverse SE  Continue current medication. Look for lipid improvements with weight reduction and lifestyle changes

## 2023-02-16 NOTE — Assessment & Plan Note (Signed)
Taking Wellbutrin XL 300 mg daily, Buspar 5 mg daily and Hydroxyzine 25 mg at bedtime prn anxiety Higher stress levels have been a factor in his weight gain over the past few years   Will be working on mindful eating, stress reduction and keeping junk food out of the house Consider adding CBT with Dr Dewaine Conger

## 2023-02-20 ENCOUNTER — Encounter: Payer: Self-pay | Admitting: Family Medicine

## 2023-02-20 ENCOUNTER — Ambulatory Visit: Payer: 59 | Admitting: Family Medicine

## 2023-02-20 VITALS — BP 140/87 | HR 105 | Temp 97.6°F | Ht 71.0 in | Wt 270.0 lb

## 2023-02-20 DIAGNOSIS — R5383 Other fatigue: Secondary | ICD-10-CM | POA: Diagnosis not present

## 2023-02-20 DIAGNOSIS — E66812 Obesity, class 2: Secondary | ICD-10-CM

## 2023-02-20 DIAGNOSIS — R0602 Shortness of breath: Secondary | ICD-10-CM | POA: Diagnosis not present

## 2023-02-20 DIAGNOSIS — Z6837 Body mass index (BMI) 37.0-37.9, adult: Secondary | ICD-10-CM

## 2023-02-20 DIAGNOSIS — E109 Type 1 diabetes mellitus without complications: Secondary | ICD-10-CM | POA: Diagnosis not present

## 2023-02-20 DIAGNOSIS — Z1331 Encounter for screening for depression: Secondary | ICD-10-CM | POA: Diagnosis not present

## 2023-02-20 DIAGNOSIS — E785 Hyperlipidemia, unspecified: Secondary | ICD-10-CM | POA: Diagnosis not present

## 2023-02-20 DIAGNOSIS — Z794 Long term (current) use of insulin: Secondary | ICD-10-CM

## 2023-02-20 DIAGNOSIS — R41840 Attention and concentration deficit: Secondary | ICD-10-CM

## 2023-02-20 NOTE — Patient Instructions (Signed)
Begin tracking daily calorie intake on one of the following: MyNet Diary MyFitnessPal Lose It  Aim for 2200 calories per day This should include 100-130 grams of protein daily  Eat breakfast, lunch and dinner daily You can have 1-2 snacks/ day  Remember to get in fruits and veggies daily  Aim for more water intake with a goal of 120 oz/ day This can include sugar free flavor packet Propel Gzero  Try to do a protein shake every morning for breakfast: FairLife, CorePower, Premier Protein Try to buddy up your protein shake with a fresh fruit serving  Reduce meals out to 2-3 per week Watch out for alcohol calories-- limit to 2 per week   (opt for lower sugar options)

## 2023-02-20 NOTE — Assessment & Plan Note (Signed)
He is doing well on vyvanse 30 mg qAM and does note a reduction in appetite early in the day This has worsened his meal skipping  With his insulin pump, he is more likely to experience low sugars from meal skipping and has met with RD at [facility] He has been adding in a protein shake in the morning and is able to get this in without problems He does have a resting tachycardia, typically having one caffeinated drink daily  Avoid use of any additional stimulants Drink a protein shake + a fruit serving in the morning

## 2023-02-20 NOTE — Progress Notes (Signed)
At a Glance:  Vitals Temp: 97.6 F (36.4 C) BP: (!) 140/87 Pulse Rate: (!) 105 SpO2: 99 %   Anthropometric Measurements Height: 5\' 11"  (1.803 m) Weight: 270 lb (122.5 kg) BMI (Calculated): 37.67 Starting Weight: 270lb   Body Composition  Body Fat %: 34.9 % Fat Mass (lbs): 94.2 lbs Muscle Mass (lbs): 167.4 lbs Total Body Water (lbs): 125.8 lbs Visceral Fat Rating : 17   Other Clinical Data RMR: 2894 Fasting: Yes Labs: Yes Today's Visit #: 1 Starting Date: 02/20/23    EKG: Normal sinus rhythm, rate 113.  Indirect Calorimeter completed today shows a VO2 of 418 and a REE of 2894.  His calculated basal metabolic rate is 1610 thus his basal metabolic rate is better than expected.  Chief Complaint:  Obesity   Subjective:  David Leon (MR# [id number]) is a 33 y.o. male who presents for evaluation and treatment of obesity and related comorbidities.   David Leon is currently in the action stage of change and ready to dedicate time achieving and maintaining a healthier weight. David Leon is interested in becoming our patient and working on intensive lifestyle modifications including (but not limited to) diet and exercise for weight loss.  David Leon has been struggling with his weight. He has been unsuccessful in either losing weight, maintaining weight loss, or reaching his healthy weight goal.  He would like to lose about 40 lb.  He has found it harder to lose weight with working long hours at a mostly sedentary job with high stress.  He has a long hx of stress eating and typically skips breakfast.  He recently started an insulin pump for T1DM and is seeing less hypoglycemia.  He has custody of his 86 yo niece.  It is the two of them at home.  He currently does no regular exercise but used to enjoy walking more.  David Leon's habits were reviewed today and are as follows: His family eats meals together, he snacks frequently in the evenings, and he struggles with emotional eating.   Other  Fatigue David Leon denies daytime somnolence and admits to waking up still tired. Patient has a history of symptoms of morning fatigue. Worth generally gets 7 hours of sleep per night, and states that he has generally restful sleep. Snoring is present. Apneic episodes are not present. Epworth Sleepiness Score is 7.   Shortness of Breath David Leon notes increasing shortness of breath with exercising and seems to be worsening over time with weight gain. He notes getting out of breath sooner with activity than he used to. This has gotten worse recently. David Leon denies shortness of breath at rest or orthopnea.   Depression Screen David Leon's Food and Mood (modified PHQ-9) score was 10.     02/07/2023   11:14 AM  Depression screen PHQ 2/9  Decreased Interest 0  Down, Depressed, Hopeless 0  PHQ - 2 Score 0  Altered sleeping 2  Tired, decreased energy 1  Change in appetite 0  Feeling bad or failure about yourself  0  Trouble concentrating 0  Moving slowly or fidgety/restless 0  Suicidal thoughts 0  PHQ-9 Score 3  Difficult doing work/chores Not difficult at all     Assessment and Plan:   Other Fatigue David Leon does feel that his weight is causing his energy to be lower than it should be. Fatigue may be related to obesity, depression or many other causes. Labs will be ordered, and in the meanwhile, David Leon will focus on self care including making healthy food choices,  increasing physical activity and focusing on stress reduction.  Shortness of Breath [name] does feel that he gets out of breath more easily that he used to when he exercises. David Leon's shortness of breath appears to be obesity related and exercise induced. He has agreed to work on weight loss and gradually increase exercise to treat his exercise induced shortness of breath. Will continue to monitor closely.  David Leon had a positive depression screening. Depression is commonly associated with obesity and often results in emotional eating behaviors. We  will monitor this closely and work on CBT to help improve the non-hunger eating patterns. Referral to Psychology may be required if no improvement is seen as he continues in our clinic.    Problem List Items Addressed This Visit     Hyperlipidemia   Lab Results  Component Value Date   CHOL 194 08/07/2022   HDL 36.90 (L) 08/07/2022   LDLCALC 127 (H) 08/07/2022   LDLDIRECT 97.0 11/30/2020   TRIG 152.0 (H) 08/07/2022   CHOLHDL 5 08/07/2022    FLP is not yet due.  He is on Rosuvastatin 5 mg daily without adverse SE.  Look for lipid improvements with healthy lifestyle changes Continue statin daily given risk of CVD with T1DM      Type 1 diabetes mellitus without complications (HCC)   Doing well with T1DM on Humalog in insulin pump  Monitors glucose readings throughout the day with a CGM Managed by [facility] Diagnosed in college  Continue visit with RD Begin active plan for weight reduction, aiming for 2200 cal/ day (this will given him a calorie deficit for weight loss based on is metabolic test results today).  Plan to start increasing walking time this month. Consider a low dose of Mounjaro to aid in insulin resistance      Relevant Orders   Hemoglobin A1c   Insulin, random   Attention and concentration deficit   He is doing well on vyvanse 30 mg qAM and does note a reduction in appetite early in the day This has worsened his meal skipping  With his insulin pump, he is more likely to experience low sugars from meal skipping and has met with [facility] at [facility] He has been adding in a protein shake in the morning and is able to get this in without problems He does have a resting tachycardia, typically having one caffeinated drink daily  Avoid use of any additional stimulants Drink a protein shake + a fruit serving in the morning       Other Visit Diagnoses       SOBOE (shortness of breath on exertion)    -  Primary     Other fatigue       Relevant Orders    EKG 12-Lead   Vitamin B12   Folate   Comprehensive metabolic panel   VITAMIN D 25 Hydroxy (Vit-D Deficiency, Fractures)     Class 2 severe obesity due to excess calories with serious comorbidity and body mass index (BMI) of 37.0 to 37.9 in adult [facility])           Ripley is currently in the action stage of change and his goal is to continue with weight loss efforts. I recommend Athan begin the structured treatment plan as follows:  He has agreed to keeping a food journal and adhering to recommended goals of 2200 calories and 100-130 g of  protein  Exercise goals: All adults should avoid inactivity. Some activity is better than none, and  adults who participate in any amount of physical activity, gain some health benefits.  Behavioral modification strategies:increasing lean protein intake, decreasing simple carbohydrates, increasing vegetables, increase H2O intake, decrease ETOH, decreasing eating out, meal planning and cooking strategies, keeping healthy foods in the home, and planning for success  He was informed of the importance of frequent follow-up visits to maximize his success with intensive lifestyle modifications for his multiple health conditions. He was informed we would discuss his lab results at his next visit unless there is a critical issue that needs to be addressed sooner. Rober agreed to keep his next visit at the agreed upon time to discuss these results.  Objective:  General: Cooperative, alert, well developed, in no acute distress. HEENT: Conjunctivae and lids unremarkable. Cardiovascular: Regular rhythm.  Lungs: Normal work of breathing. Neurologic: No focal deficits.   Lab Results  Component Value Date   CREATININE 0.87 09/03/2022   BUN 11 09/03/2022   NA 135 09/03/2022   K 4.1 09/03/2022   CL 100 09/03/2022   CO2 27 09/03/2022   Lab Results  Component Value Date   ALT 14 09/03/2022   AST 11 09/03/2022   ALKPHOS 50 08/07/2022   BILITOT 0.7 09/03/2022    Lab Results  Component Value Date   HGBA1C 10.2 07/22/2022   HGBA1C 10.0 (H) 06/28/2021   HGBA1C 10.9 (H) 11/30/2020   HGBA1C 11.4 (H) 05/26/2018   HGBA1C 10.9 (H) 10/14/2017   No results found for: "INSULIN" Lab Results  Component Value Date   TSH 10.96 (H) 02/07/2023   Lab Results  Component Value Date   CHOL 194 08/07/2022   HDL 36.90 (L) 08/07/2022   LDLCALC 127 (H) 08/07/2022   LDLDIRECT 97.0 11/30/2020   TRIG 152.0 (H) 08/07/2022   CHOLHDL 5 08/07/2022   Lab Results  Component Value Date   WBC 4.4 08/07/2022   HGB 15.1 08/07/2022   HCT 44.4 08/07/2022   MCV 95.5 08/07/2022   PLT 241.0 08/07/2022   No results found for: "IRON", "TIBC", "FERRITIN"  Attestation Statements:  Reviewed by clinician on day of visit: allergies, medications, problem list, medical history, surgical history, family history, social history, and previous encounter notes.  Time spent on visit including pre-visit chart review and post-visit charting and care was 45 minutes.   Glennis Brink, DO

## 2023-02-20 NOTE — Assessment & Plan Note (Signed)
Doing well with T1DM on Humalog in insulin pump  Monitors glucose readings throughout the day with a CGM Managed by [facility] Diagnosed in college  Continue visit with RD Begin active plan for weight reduction, aiming for 2200 cal/ day (this will given him a calorie deficit for weight loss based on is metabolic test results today).  Plan to start increasing walking time this month. Consider a low dose of Mounjaro to aid in insulin resistance

## 2023-02-20 NOTE — Assessment & Plan Note (Addendum)
Lab Results  Component Value Date   CHOL 194 08/07/2022   HDL 36.90 (L) 08/07/2022   LDLCALC 127 (H) 08/07/2022   LDLDIRECT 97.0 11/30/2020   TRIG 152.0 (H) 08/07/2022   CHOLHDL 5 08/07/2022    FLP is not yet due.  He is on Rosuvastatin 5 mg daily without adverse SE.  Look for lipid improvements with healthy lifestyle changes Continue statin daily given risk of CVD with T1DM

## 2023-02-21 LAB — COMPREHENSIVE METABOLIC PANEL
ALT: 28 [IU]/L (ref 0–44)
AST: 20 [IU]/L (ref 0–40)
Albumin: 4.8 g/dL (ref 4.1–5.1)
Alkaline Phosphatase: 66 [IU]/L (ref 44–121)
BUN/Creatinine Ratio: 9 (ref 9–20)
BUN: 9 mg/dL (ref 6–20)
Bilirubin Total: 0.5 mg/dL (ref 0.0–1.2)
CO2: 24 mmol/L (ref 20–29)
Calcium: 10.1 mg/dL (ref 8.7–10.2)
Chloride: 100 mmol/L (ref 96–106)
Creatinine, Ser: 0.95 mg/dL (ref 0.76–1.27)
Globulin, Total: 2.6 g/dL (ref 1.5–4.5)
Glucose: 142 mg/dL — ABNORMAL HIGH (ref 70–99)
Potassium: 4.4 mmol/L (ref 3.5–5.2)
Sodium: 140 mmol/L (ref 134–144)
Total Protein: 7.4 g/dL (ref 6.0–8.5)
eGFR: 108 mL/min/{1.73_m2} (ref >59)

## 2023-02-21 LAB — INSULIN, RANDOM: INSULIN: <0.4 u[IU]/mL — ABNORMAL LOW (ref 2.6–24.9)

## 2023-02-21 LAB — FOLATE: Folate: 10.1 ng/mL (ref >3.0)

## 2023-02-21 LAB — HEMOGLOBIN A1C
Est. average glucose Bld gHb Est-mCnc: 237 mg/dL
Hgb A1c MFr Bld: 9.9 % — ABNORMAL HIGH (ref 4.8–5.6)

## 2023-02-21 LAB — VITAMIN B12: Vitamin B-12: 448 pg/mL (ref 232–1245)

## 2023-02-21 LAB — VITAMIN D 25 HYDROXY (VIT D DEFICIENCY, FRACTURES): Vit D, 25-Hydroxy: 15.2 ng/mL — ABNORMAL LOW (ref 30.0–100.0)

## 2023-03-06 ENCOUNTER — Encounter: Payer: Self-pay | Admitting: Family Medicine

## 2023-03-06 ENCOUNTER — Ambulatory Visit: Payer: 59 | Admitting: Family Medicine

## 2023-03-06 VITALS — BP 139/92 | HR 116 | Temp 98.0°F | Ht 71.0 in | Wt 271.0 lb

## 2023-03-06 DIAGNOSIS — E66812 Obesity, class 2: Secondary | ICD-10-CM

## 2023-03-06 DIAGNOSIS — Z6837 Body mass index (BMI) 37.0-37.9, adult: Secondary | ICD-10-CM

## 2023-03-06 DIAGNOSIS — E88819 Insulin resistance, unspecified: Secondary | ICD-10-CM | POA: Diagnosis not present

## 2023-03-06 DIAGNOSIS — E559 Vitamin D deficiency, unspecified: Secondary | ICD-10-CM | POA: Insufficient documentation

## 2023-03-06 DIAGNOSIS — E109 Type 1 diabetes mellitus without complications: Secondary | ICD-10-CM

## 2023-03-06 DIAGNOSIS — Z7985 Long-term (current) use of injectable non-insulin antidiabetic drugs: Secondary | ICD-10-CM

## 2023-03-06 MED ORDER — SEMAGLUTIDE(0.25 OR 0.5MG/DOS) 2 MG/3ML ~~LOC~~ SOPN: 0.2500 mg | PEN_INJECTOR | SUBCUTANEOUS | 0 refills | Status: DC

## 2023-03-06 MED ORDER — VITAMIN D (ERGOCALCIFEROL) 1.25 MG (50000 UNIT) PO CAPS: 50000.0000 [IU] | ORAL_CAPSULE | ORAL | 0 refills | Status: DC

## 2023-03-06 NOTE — Progress Notes (Signed)
Office: [phone]  /  Fax: [phone]  WEIGHT SUMMARY AND BIOMETRICS  Starting Date: 02/20/23  Starting Weight: 270lb   Weight Lost Since Last Visit: 0lb   Vitals Temp: 98 F (36.7 C) BP: (!) 139/92 Pulse Rate: (!) 116 SpO2: 99 %   Body Composition  Body Fat %: 34.4 % Fat Mass (lbs): 93.4 lbs Muscle Mass (lbs): 169.4 lbs Total Body Water (lbs): 127.6 lbs Visceral Fat Rating : 17    HPI  Chief Complaint: OBESITY  David Leon is here to discuss his progress with his obesity treatment plan. He is on the keeping a food journal and adhering to recommended goals of 2200 calories and 100-130 protein and states he is following his eating plan approximately 80 % of the time. He states he is exercising 60 minutes 4 times per week.  Interval History:  Since last office visit he is up 1 lb He has been working on [employer] He was aware of boredom snacking He feels adequately full with 1800 cal/ day with 100+ g of protein daily He has been working out at [employer] - both cardio and resistance training 4 x a week He has been mindful of food choices and cut back on meals out He is getting in some veggies He has good control of his blood sugar with type I diabetes  Pharmacotherapy: None  PHYSICAL EXAM:  Blood pressure (!) 139/92, pulse (!) 116, temperature 98 F (36.7 C), height 5\' 11"  (1.803 m), weight 271 lb (122.9 kg), SpO2 99%. Body mass index is 37.8 kg/m. (Pt was in a hurry to get here, driving in the rain)  General: He is overweight, cooperative, alert, well developed, and in no acute distress. PSYCH: Has normal mood, affect and thought process.   Lungs: Normal breathing effort, no conversational dyspnea.   ASSESSMENT AND PLAN  TREATMENT PLAN FOR OBESITY:  Recommended Dietary Goals  [name] is currently in the action stage of change. As such, his goal is to continue weight management plan. He has agreed to keeping a food journal and adhering to recommended  goals of 1800 calories and 100 to 130 g of protein. He may incorporate 16:8 intermittent fasting (skipping breakfast) --he was previously doing this before starting his treatment here.  He denies hypoglycemia in the morning.  He avoids his morning dose of mealtime insulin with fasting.  He does drink 2 cups of coffee with sugar-free creamer in the morning.  Monitor for low blood sugar readings and increased appetite by doing this  Behavioral Intervention  We discussed the following Behavioral Modification Strategies today: increasing lean protein intake to established goals, increasing vegetables, increasing water intake , work on meal planning and preparation, work on Counselling psychologist calories using tracking application, keeping healthy foods at home, work on managing stress, creating time for self-care and relaxation, avoiding temptations and identifying enticing environmental cues, planning for success, and continue to work on maintaining a reduced calorie state, getting the recommended amount of protein, incorporating whole foods, making healthy choices, staying well hydrated and practicing mindfulness when eating..  Additional resources provided today: NA  Recommended Physical Activity Goals  [name] has been advised to work up to 150 minutes of moderate intensity aerobic activity a week and strengthening exercises 2-3 times per week for cardiovascular health, weight loss maintenance and preservation of muscle mass.   He has agreed to Increase the intensity, frequency or duration of strengthening exercises  Encouraged him to continue gym workouts to include both  cardio and resistance training 4 days a week  Pharmacotherapy changes for the treatment of obesity: Add Ozempic 0.25 mg once weekly injection Patient denies a personal or family history of pancreatitis, medullary thyroid carcinoma or multiple endocrine neoplasia type II. Recommend reviewing pen training video online. Reviewed  mechanism of action and potential adverse side effects of Ozempic Use caution using Ozempic along with insulin due to risk of hypoglycemia He agrees to monitor his blood sugar closely using his CGM  ASSOCIATED CONDITIONS ADDRESSED TODAY  Insulin resistance Assessment & Plan: Given his truncal adiposity and need for more insulin with weight gain, there is a component of insulin resistance which has made it hard for him to lose weight despite making changes to diet and exercise.  With the need for more insulin, he has gained more weight over time.  He has started working on reducing his starches and sweets and added in gym workouts to include both cardio and resistance training 4 days a week.  These lifestyle changes are expected to help with insulin resistance.  He agrees to starting Ozempic at 0.25 mg once weekly injection Monitor blood sugar readings closely as he is on insulin pump Continue working on eating on a schedule and avoiding high glycemic index foods  Orders: -     Semaglutide(0.25 or 0.5MG /DOS); Inject 0.25 mg into the skin once a week.  Dispense: 3 mL; Refill: 0  Type 1 diabetes mellitus without complications [facility] Assessment & Plan: Reviewed lab findings with patient.  His A1c was 9.9.  He is managed by [facility] for type 1 diabetes.  He has a CGM and insulin pump.  He is reaching about his insulin schedule and has improved his food choices and mindfulness receding.  He has added in gym workouts to include both cardio and strength 4 days a week.  Renal function was normal on lab  Continue routine follow-up with endocrinology given his A1c of 9.9  Orders: -     Semaglutide(0.25 or 0.5MG /DOS); Inject 0.25 mg into the skin once a week.  Dispense: 3 mL; Refill: 0  Vitamin D deficiency Assessment & Plan: New finding Last vitamin D Lab Results  Component Value Date   VD25OH 15.2 (L) 02/20/2023   We reviewed how vitamin D deficiency can cause fatigue, immune dysfunction  and bone loss.  He is currently not on a vitamin D supplement.  Begin vitamin D 50,000 IU once weekly Repeat lab in 3 to 4 months  Orders: -     Vitamin D (Ergocalciferol); Take 1 capsule (50,000 Units total) by mouth every 7 (seven) days.  Dispense: 12 capsule; Refill: 0  Class 2 severe obesity due to excess calories with serious comorbidity and body mass index (BMI) of 37.0 to 37.9 in adult [facility])      He was informed of the importance of frequent follow up visits to maximize his success with intensive lifestyle modifications for his multiple health conditions.   ATTESTASTION STATEMENTS:  Reviewed by clinician on day of visit: allergies, medications, problem list, medical history, surgical history, family history, social history, and previous encounter notes pertinent to obesity diagnosis.   I have personally spent 30 minutes total time today in preparation, patient care, nutritional counseling and documentation for this visit, including the following: review of clinical lab tests; review of medical tests/procedures/services.      Glennis Brink, DO DABFM, DABOM [facility] [address] [place], [place] [address] [phone]

## 2023-03-06 NOTE — Assessment & Plan Note (Signed)
Given his truncal adiposity and need for more insulin with weight gain, there is a component of insulin resistance which has made it hard for him to lose weight despite making changes to diet and exercise.  With the need for more insulin, he has gained more weight over time.  He has started working on reducing his starches and sweets and added in gym workouts to include both cardio and resistance training 4 days a week.  These lifestyle changes are expected to help with insulin resistance.  He agrees to starting Ozempic at 0.25 mg once weekly injection Monitor blood sugar readings closely as he is on insulin pump Continue working on eating on a schedule and avoiding high glycemic index foods

## 2023-03-06 NOTE — Patient Instructions (Signed)
Continue to log daily calorie intake with a goal ~1800 cal/ day This should include 100-130 g of protein daily  [place] job with cardio and resistance training 4 x a week!  You can incorporate intermittent fasting ( skipping breakfast)  OK to drink coffee + water during fasting Limit nighttime snacks You can protein shake to afternoon or evening  Hydrate well with water  Plan to add Ozempic 0.25 mg once a week injection for insulin resistance Call if any problem or questions  Begin RX vitamin D weekly  The Obesity Code The Diabetes Code - Dr Wylene Simmer

## 2023-03-06 NOTE — Assessment & Plan Note (Signed)
New finding Last vitamin D Lab Results  Component Value Date   VD25OH 15.2 (L) 02/20/2023   We reviewed how vitamin D deficiency can cause fatigue, immune dysfunction and bone loss.  He is currently not on a vitamin D supplement.  Begin vitamin D 50,000 IU once weekly Repeat lab in 3 to 4 months

## 2023-03-06 NOTE — Assessment & Plan Note (Signed)
Reviewed lab findings with patient.  His A1c was 9.9.  He is managed by [facility] for type 1 diabetes.  He has a CGM and insulin pump.  He is reaching about his insulin schedule and has improved his food choices and mindfulness receding.  He has added in gym workouts to include both cardio and strength 4 days a week.  Renal function was normal on lab  Continue routine follow-up with endocrinology given his A1c of 9.9

## 2023-03-11 DIAGNOSIS — E66812 Obesity, class 2: Secondary | ICD-10-CM

## 2023-03-12 NOTE — Telephone Encounter (Signed)
 Prior authorization done via cover my meds. Waiting on determination.

## 2023-03-19 ENCOUNTER — Telehealth: Payer: Self-pay | Admitting: *Deleted

## 2023-03-19 NOTE — Telephone Encounter (Signed)
 Prior authorization done via cover my meds for patients. [place]. Waiting on determination.

## 2023-03-21 MED ORDER — TIRZEPATIDE-WEIGHT MANAGEMENT 2.5 MG/0.5ML ~~LOC~~ SOLN: 2.5000 mg | SUBCUTANEOUS | 0 refills | Status: DC

## 2023-03-21 NOTE — Addendum Note (Signed)
 Addended by: Glennis Brink on: 03/21/2023 12:24 PM   Modules accepted: Orders

## 2023-04-01 ENCOUNTER — Other Ambulatory Visit: Payer: Self-pay | Admitting: Nurse Practitioner

## 2023-04-01 DIAGNOSIS — F411 Generalized anxiety disorder: Secondary | ICD-10-CM

## 2023-04-02 ENCOUNTER — Ambulatory Visit: Payer: 59 | Admitting: Family Medicine

## 2023-04-06 ENCOUNTER — Other Ambulatory Visit: Payer: Self-pay | Admitting: Family

## 2023-04-12 ENCOUNTER — Other Ambulatory Visit: Payer: Self-pay | Admitting: Family Medicine

## 2023-04-12 DIAGNOSIS — E6609 Other obesity due to excess calories: Secondary | ICD-10-CM

## 2023-04-16 ENCOUNTER — Ambulatory Visit: Admitting: Family Medicine

## 2023-04-16 ENCOUNTER — Encounter: Payer: Self-pay | Admitting: Family Medicine

## 2023-04-16 VITALS — BP 113/77 | HR 105 | Temp 98.0°F | Ht 71.0 in | Wt 260.0 lb

## 2023-04-16 DIAGNOSIS — E109 Type 1 diabetes mellitus without complications: Secondary | ICD-10-CM | POA: Diagnosis not present

## 2023-04-16 DIAGNOSIS — Z6836 Body mass index (BMI) 36.0-36.9, adult: Secondary | ICD-10-CM

## 2023-04-16 DIAGNOSIS — E6609 Other obesity due to excess calories: Secondary | ICD-10-CM

## 2023-04-16 DIAGNOSIS — E66812 Obesity, class 2: Secondary | ICD-10-CM

## 2023-04-16 DIAGNOSIS — Z794 Long term (current) use of insulin: Secondary | ICD-10-CM

## 2023-04-16 DIAGNOSIS — E88819 Insulin resistance, unspecified: Secondary | ICD-10-CM | POA: Diagnosis not present

## 2023-04-16 DIAGNOSIS — E559 Vitamin D deficiency, unspecified: Secondary | ICD-10-CM | POA: Diagnosis not present

## 2023-04-16 DIAGNOSIS — Z7985 Long-term (current) use of injectable non-insulin antidiabetic drugs: Secondary | ICD-10-CM

## 2023-04-16 MED ORDER — ZEPBOUND 5 MG/0.5ML ~~LOC~~ SOAJ: 5.0000 mg | SUBCUTANEOUS | 0 refills | Status: DC

## 2023-04-16 NOTE — Progress Notes (Signed)
 Office: [phone]  /  Fax: [phone]  WEIGHT SUMMARY AND BIOMETRICS  Starting Date: 02/20/23  Starting Weight: 270lb   Weight Lost Since Last Visit: 11lb   Vitals Temp: 98 F (36.7 C) BP: 113/77 Pulse Rate: (!) 105 SpO2: 100 %   Body Composition  Body Fat %: 32.5 % Fat Mass (lbs): 84.8 lbs Muscle Mass (lbs): 167.4 lbs Total Body Water (lbs): 121.4 lbs Visceral Fat Rating : 15   HPI  Chief Complaint: OBESITY  David Leon is here to discuss his progress with his obesity treatment plan. He is following a nutritionist for his meal plan. He states he is exercising 60+ minutes 4 times per week.  Interval History:  Since last office visit he is down 11 lb This gives him a net weight loss of 10 lb in 7 weeks He started Zepbound 2.5 mg injections via [employer] 3 weeks ago This has improved portion control and hunger He denies adverse SE He is going to the gym doing both cardio and weight training for a total of 4 x a week  Pharmacotherapy: Zepbound 2.5 mg weekly  PHYSICAL EXAM:  Blood pressure 113/77, pulse (!) 105, temperature 98 F (36.7 C), height 5\' 11"  (1.803 m), weight 260 lb (117.9 kg), SpO2 100%. Body mass index is 36.26 kg/m.  General: He is overweight, cooperative, alert, well developed, and in no acute distress. PSYCH: Has normal mood, affect and thought process.   Lungs: Normal breathing effort, no conversational dyspnea.  ASSESSMENT AND PLAN  TREATMENT PLAN FOR OBESITY:  Recommended Dietary Goals  David Leon is currently in the action stage of change. As such, his goal is to continue weight management plan. He has agreed to keeping a food journal and adhering to recommended goals of 2200 calories and 120 g of  protein and practicing portion control and making smarter food choices, such as increasing vegetables and decreasing simple carbohydrates.  Behavioral Intervention  We discussed the following Behavioral Modification Strategies today:  increasing lean protein intake to established goals, increasing fiber rich foods, increasing water intake , work on meal planning and preparation, work on Counselling psychologist calories using tracking application, keeping healthy foods at home, practice mindfulness eating and understand the difference between hunger signals and cravings, work on managing stress, creating time for self-care and relaxation, and continue to work on maintaining a reduced calorie state, getting the recommended amount of protein, incorporating whole foods, making healthy choices, staying well hydrated and practicing mindfulness when eating..  Additional resources provided today: NA  Recommended Physical Activity Goals  David Leon has been advised to work up to 150 minutes of moderate intensity aerobic activity a week and strengthening exercises 2-3 times per week for cardiovascular health, weight loss maintenance and preservation of muscle mass.   He has agreed to Work on scheduling and tracking physical activity.   Pharmacotherapy changes for the treatment of obesity: increase next RX of Zepbound to 5 mg weekly injection  ASSOCIATED CONDITIONS ADDRESSED TODAY  Insulin resistance IR related to truncal adiposity with type I diabetes He has done well with the addition of Zepbound which has reduced his insulin need He is down on visceral fat rating from 17-->15 He is doing well with dietary changes and regular exercise  Class 2 obesity due to excess calories with body mass index (BMI) of 36.0 to 36.9 in adult, unspecified whether serious comorbidity present Denies meal skipping, hypoglycemia, N/V/ reflux, constipation with zepbound  -     Zepbound; Inject 5  mg into the skin once a week.  Dispense: 2 mL; Refill: 0  Type 1 diabetes mellitus without complications (HCC) Lab Results  Component Value Date   HGBA1C 9.9 (H) 02/20/2023   Continue management thru [facility] endo Using CGM to track glucose readings which  are improving Denies hypoglycemia or meal skipping with the addition of Zepbound Continue to follow with RD for nutrition plan  Vitamin D deficiency Last vitamin D Lab Results  Component Value Date   VD25OH 15.2 (L) 02/20/2023   On RX vitamin D 50,000 international units  weekly Repeat lab in 2 mos     He was informed of the importance of frequent follow up visits to maximize his success with intensive lifestyle modifications for his multiple health conditions.   ATTESTASTION STATEMENTS:  Reviewed by clinician on day of visit: allergies, medications, problem list, medical history, surgical history, family history, social history, and previous encounter notes pertinent to obesity diagnosis.   I have personally spent 30 minutes total time today in preparation, patient care, nutritional counseling and documentation for this visit, including the following: review of clinical lab tests; review of medical tests/procedures/services.      Glennis Brink, DO DABFM, DABOM [facility] [address] [place], [place] [address] [phone]

## 2023-04-22 MED ORDER — TIRZEPATIDE-WEIGHT MANAGEMENT 5 MG/0.5ML ~~LOC~~ SOLN: 5.0000 mg | SUBCUTANEOUS | 0 refills | Status: AC

## 2023-04-22 MED ORDER — TIRZEPATIDE-WEIGHT MANAGEMENT 5 MG/0.5ML ~~LOC~~ SOLN: 5.0000 mg | SUBCUTANEOUS | 0 refills | Status: DC

## 2023-04-22 NOTE — Addendum Note (Signed)
 Addended by: Glennis Brink on: 04/22/2023 08:52 AM   Modules accepted: Orders

## 2023-04-22 NOTE — Addendum Note (Signed)
 Addended by: Glennis Brink on: 04/22/2023 07:32 AM   Modules accepted: Orders

## 2023-04-29 ENCOUNTER — Ambulatory Visit: Admitting: Nurse Practitioner

## 2023-04-29 VITALS — BP 118/84 | HR 92 | Temp 97.9°F | Ht 71.0 in | Wt 260.2 lb

## 2023-04-29 DIAGNOSIS — M79605 Pain in left leg: Secondary | ICD-10-CM | POA: Diagnosis not present

## 2023-04-29 LAB — IBC + FERRITIN
Ferritin: 25.3 ng/mL (ref 22.0–322.0)
Iron: 66 ug/dL (ref 42–165)
Saturation Ratios: 25.6 % (ref 20.0–50.0)
TIBC: 257.6 ug/dL (ref 250.0–450.0)
Transferrin: 184 mg/dL — ABNORMAL LOW (ref 212.0–360.0)

## 2023-04-29 LAB — CBC
HCT: 43.8 % (ref 39.0–52.0)
Hemoglobin: 15.1 g/dL (ref 13.0–17.0)
MCHC: 34.4 g/dL (ref 30.0–36.0)
MCV: 94 fl (ref 78.0–100.0)
Platelets: 226 10*3/uL (ref 150.0–400.0)
RBC: 4.66 Mil/uL (ref 4.22–5.81)
RDW: 12.1 % (ref 11.5–15.5)
WBC: 5.5 10*3/uL (ref 4.0–10.5)

## 2023-04-29 NOTE — Progress Notes (Signed)
 Acute Office Visit  Subjective:     Patient ID: David Leon, male    DOB: Jun 23, 1990, 33 y.o.   MRN: [id number]  Chief Complaint  Patient presents with   Leg Pain    Pt complains of Left and Right leg pain with tingling. Pt complains of feet pain when sitting down. Pt states that it almost feels like restless legs. States happens in the afternoon. Knee and Feet throbbing when relaxing.  Ongoing for 2 months.      Patient is in today for leg pain with a history of DM1, HIV, hypothyroidism, Vitmain D def, GAD, ADD   States that his left leg is bothering him. States that it is getting to be every night and when he is sitting behind his desk. Someitmes with rest all the time at night.  Does not wear wallet or phone in the back pack. States that it is a sharp stabbing and then dull aching that is higher. States that he has tried ibuprofen without relief. States that getting up and moving can help some. Review of Systems  Constitutional:  Negative for chills and fever.  Respiratory:  Negative for shortness of breath.   Cardiovascular:  Negative for chest pain.  Musculoskeletal:  Positive for joint pain.  Neurological:  Negative for tingling, weakness and headaches.        Objective:    BP 118/84   Pulse 92   Temp 97.9 F (36.6 C) (Oral)   Ht 5\' 11"  (1.803 m)   Wt 260 lb 3.2 oz (118 kg)   SpO2 97%   BMI 36.29 kg/m    Physical Exam Vitals and nursing note reviewed.  Constitutional:      Appearance: Normal appearance.  Cardiovascular:     Rate and Rhythm: Normal rate and regular rhythm.     Pulses:          Dorsalis pedis pulses are 2+ on the right side and 2+ on the left side.       Posterior tibial pulses are 1+ on the right side and 1+ on the left side.     Heart sounds: Normal heart sounds.  Pulmonary:     Effort: Pulmonary effort is normal.     Breath sounds: Normal breath sounds.  Musculoskeletal:        General: No tenderness.     Lumbar back: No  tenderness or bony tenderness. Negative right straight leg raise test and negative left straight leg raise test.     Right lower leg: No edema.     Left lower leg: No edema.  Neurological:     Mental Status: He is alert.     Deep Tendon Reflexes:     Reflex Scores:      Patellar reflexes are 1+ on the right side and 1+ on the left side.    Comments: Bilateral lower extremities 5/5     No results found for any visits on 04/29/23.      Assessment & Plan:   Problem List Items Addressed This Visit       Other   Left leg pain - Primary   No clear source of discomfort.  Patient had no tenderness on palpation or provocative exams in office.  Will check a CBC and iron studies x-ray is not low iron.  Patient anticipates getting a massage and possible evaluation by chiropractor.      Relevant Orders   CBC   IBC + Ferritin  No orders of the defined types were placed in this encounter.   Return if symptoms worsen or fail to improve, for As scheduled .  Audria Nine, NP

## 2023-04-29 NOTE — Assessment & Plan Note (Signed)
 No clear source of discomfort.  Patient had no tenderness on palpation or provocative exams in office.  Will check a CBC and iron studies x-ray is not low iron.  Patient anticipates getting a massage and possible evaluation by chiropractor.

## 2023-04-29 NOTE — Patient Instructions (Signed)
 Nice to see you today I will be in touch with the labs You can try over the counter volatren gel OR lidocaine gel Follow up as scheduled or as needed if you do not improve

## 2023-04-30 ENCOUNTER — Encounter: Payer: Self-pay | Admitting: Nurse Practitioner

## 2023-05-16 ENCOUNTER — Ambulatory Visit: Admitting: Family Medicine

## 2023-05-24 ENCOUNTER — Other Ambulatory Visit: Payer: Self-pay | Admitting: Family Medicine

## 2023-05-24 DIAGNOSIS — E559 Vitamin D deficiency, unspecified: Secondary | ICD-10-CM

## 2023-05-27 ENCOUNTER — Encounter: Payer: Self-pay | Admitting: Nurse Practitioner

## 2023-05-28 MED ORDER — AMPHETAMINE-DEXTROAMPHET ER 20 MG PO CP24: 20.0000 mg | ORAL_CAPSULE | Freq: Every day | ORAL | 0 refills | Status: DC

## 2023-06-03 ENCOUNTER — Ambulatory Visit: Admitting: Family

## 2023-06-12 DIAGNOSIS — Z21 Asymptomatic human immunodeficiency virus [HIV] infection status: Secondary | ICD-10-CM

## 2023-06-12 MED ORDER — DOVATO 50-300 MG PO TABS: 1.0000 | ORAL_TABLET | Freq: Every day | ORAL | 0 refills | Status: DC

## 2023-06-26 ENCOUNTER — Other Ambulatory Visit: Payer: Self-pay

## 2023-06-26 ENCOUNTER — Encounter: Payer: Self-pay | Admitting: Family

## 2023-06-26 ENCOUNTER — Ambulatory Visit: Admitting: Family

## 2023-06-26 VITALS — BP 135/88 | HR 97 | Temp 98.1°F | Ht 71.0 in | Wt 249.0 lb

## 2023-06-26 DIAGNOSIS — Z0000 Encounter for general adult medical examination without abnormal findings: Secondary | ICD-10-CM | POA: Insufficient documentation

## 2023-06-26 DIAGNOSIS — Z21 Asymptomatic human immunodeficiency virus [HIV] infection status: Secondary | ICD-10-CM

## 2023-06-26 MED ORDER — DOVATO 50-300 MG PO TABS
1.0000 | ORAL_TABLET | Freq: Every day | ORAL | 6 refills | Status: AC
Start: 2023-06-26 — End: ?

## 2023-06-26 NOTE — Assessment & Plan Note (Signed)
 David Leon continues to have well-controlled virus with good adherence and tolerance to Dovato .  Reviewed previous lab work and discussed plan of care and U equals U.  No problem obtaining medication covered by [place] [employer].  Social determinants of health reviewed with no interventions indicated.  Check lab work.  Continue current dose of Dovato .  Plan for follow-up in 6 months or sooner if needed with lab work on the same day.

## 2023-06-26 NOTE — Patient Instructions (Addendum)
 Nice to see you. ? ?We will check your lab work today. ? ?Continue to take your medication daily as prescribed. ? ?Refills have been sent to the pharmacy. ? ?Plan for follow up in 6 months or sooner if needed with lab work on the same day. ? ?Have a great day and stay safe! ? ?

## 2023-06-26 NOTE — Progress Notes (Signed)
 Brief Narrative   Patient ID: David Leon, male    DOB: 08-08-1990, 33 y.o.   MRN: [id number]  David Leon is a 33 y/o male caucasian male diagnosed with HIV-1 disease 04/08/17 with risk factor of MSM. Initial viral load was 64,000 with CD4 count of 774. Gentotyping not available. Entered care at [facility] Stage 1. No history of opportunistic infection. Quantiferon Gold and [id number] negative. ART experienced with Triumeq .   Subjective:   Chief Complaint  Patient presents with   Follow-up    HPI:  David Leon is a 33 y.o. male with HIV disease last seen on 10/04/2022 by Sandford Croon, PharmD, CPP with well-controlled virus and good adherence and tolerance to Dovato .  Viral load was undetectable with CD4 count 781.  Most recent kidney function, liver function, electrolytes within normal ranges completed on 02/20/2023.  Here today for routine follow-up.  David Leon has been doing well since his last office visit and continues to take Dovato  as prescribed with no adverse side effects or problems obtaining medication from the pharmacy.  Covered by [employer].  Been busy helping his daughter get through school which is now overdue as she was taking multiple advanced placement classes.  Housing, access to food, and transportation are stable.  Healthcare maintenance reviewed.  Not currently sexually active.  Routine dental care is up-to-date.  Denies fevers, chills, night sweats, headaches, changes in vision, neck pain/stiffness, nausea, diarrhea, vomiting, lesions or rashes.  Lab Results  Component Value Date   CD4TCELL 51 09/03/2022   CD4TABS 781 09/03/2022   Lab Results  Component Value Date   HIV1RNAQUANT Not Detected 10/04/2022     Allergies  Allergen Reactions   Penicillins Other (See Comments)    Has not had a reaction, just family history of same Has patient had a PCN reaction causing immediate rash, facial/tongue/throat swelling, SOB or lightheadedness with  hypotension: No Has patient had a PCN reaction causing severe rash involving mucus membranes or skin necrosis: No Has patient had a PCN reaction that required hospitalization: No Has patient had a PCN reaction occurring within the last 10 years: No If all of the above answers are "NO", then may proceed with Cephalosporin use.       Outpatient Medications Prior to Visit  Medication Sig Dispense Refill   amphetamine -dextroamphetamine (ADDERALL XR) 20 MG 24 hr capsule Take 1 capsule (20 mg total) by mouth daily. 30 capsule 0   BAQSIMI  ONE PACK 3 MG/DOSE POWD Place 1 each into both nostrils daily.     buPROPion  (WELLBUTRIN  XL) 300 MG 24 hr tablet Take 1 tablet by mouth once daily 90 tablet 1   busPIRone  (BUSPAR ) 5 MG tablet Take 1 tablet by mouth once daily 90 tablet 1   ciclopirox  (PENLAC ) 8 % solution Apply topically at bedtime. Apply over nail and surrounding skin. Apply daily over previous coat. After seven (7) days, may remove with alcohol and continue cycle. 6.6 mL 0   Continuous Glucose Sensor (DEXCOM G7 SENSOR) MISC Inject into back of upper arm for continuous glucose monitoring. Change every 10 days.     glucagon  1 MG injection Inject 1 mg into the muscle once as needed. 1 each 12   hydrOXYzine  (VISTARIL ) 25 MG capsule Take 1 capsule (25 mg total) by mouth at bedtime as needed. 90 capsule 0   Insulin  Disposable Pump (OMNIPOD 5 DEXG7G6 INTRO GEN 5) KIT SMARTSIG:SUB-Q Every 3 Days     Insulin  Disposable Pump (OMNIPOD 5  DEXG7G6 PODS GEN 5) MISC SMARTSIG:1 SUB-Q Every 3 Days     Insulin  Disposable Pump (OMNIPOD 5 DEXG7G6 PODS GEN 5) MISC SMARTSIG:1 SUB-Q Every 3 Days     insulin  lispro (HUMALOG ) 100 UNIT/ML injection Use via insulin  pump. TDD 100 units     levothyroxine  (SYNTHROID ) 200 MCG tablet Take 1 tablet (200 mcg total) by mouth daily before breakfast. 90 tablet 1   Multiple Vitamins-Minerals (MULTIVITAMIN ADULT PO) Take 1 tablet by mouth daily.     NOVOLOG  RELION 100 UNIT/ML  injection USE VIA INSULIN  PUMP. TOTAL DAILY DOSE OF 100 UNITS     rosuvastatin  (CRESTOR ) 5 MG tablet Take 1 tablet (5 mg total) by mouth daily. 90 tablet 3   tirzepatide  5 MG/0.5ML injection vial Inject 5 mg into the skin once a week. 2 mL 0   Vitamin D , Ergocalciferol , (DRISDOL ) 1.25 MG (50000 UNIT) CAPS capsule Take 1 capsule (50,000 Units total) by mouth every 7 (seven) days. 12 capsule 0   dolutegravir -lamiVUDine  (DOVATO ) 50-300 MG tablet Take 1 tablet by mouth daily. Needs appointment before additional refills 30 tablet 0   No facility-administered medications prior to visit.     Past Medical History:  Diagnosis Date   ADD (attention deficit disorder)    Diabetes mellitus without complication (HCC)    Diabetic ketoacidosis without coma associated with type 1 diabetes mellitus (HCC)    HIV infection (HCC)    Hyperlipidemia 09/11/2014   Thrombocytopenia (HCC) 09/10/2014   Thyroid  disease      No past surgical history on file.      Review of Systems  Constitutional:  Negative for appetite change, chills, fatigue, fever and unexpected weight change.  Eyes:  Negative for visual disturbance.  Respiratory:  Negative for cough, chest tightness, shortness of breath and wheezing.   Cardiovascular:  Negative for chest pain and leg swelling.  Gastrointestinal:  Negative for abdominal pain, constipation, diarrhea, nausea and vomiting.  Genitourinary:  Negative for dysuria, flank pain, frequency, genital sores, hematuria and urgency.  Skin:  Negative for rash.  Allergic/Immunologic: Negative for immunocompromised state.  Neurological:  Negative for dizziness and headaches.     Objective:   BP 135/88   Pulse 97   Temp 98.1 F (36.7 C) (Oral)   Ht 5\' 11"  (1.803 m)   Wt 249 lb (112.9 kg)   SpO2 100%   BMI 34.73 kg/m  Nursing note and vital signs reviewed.  Physical Exam Constitutional:      General: He is not in acute distress.    Appearance: He is well-developed.  Eyes:      Conjunctiva/sclera: Conjunctivae normal.  Cardiovascular:     Rate and Rhythm: Normal rate and regular rhythm.     Heart sounds: Normal heart sounds. No murmur heard.    No friction rub. No gallop.  Pulmonary:     Effort: Pulmonary effort is normal. No respiratory distress.     Breath sounds: Normal breath sounds. No wheezing or rales.  Chest:     Chest wall: No tenderness.  Abdominal:     General: Bowel sounds are normal.     Palpations: Abdomen is soft.     Tenderness: There is no abdominal tenderness.  Musculoskeletal:     Cervical back: Neck supple.  Lymphadenopathy:     Cervical: No cervical adenopathy.  Skin:    General: Skin is warm and dry.     Findings: No rash.  Neurological:     Mental Status: He is alert and  oriented to person, place, and time.  Psychiatric:        Behavior: Behavior normal.        Thought Content: Thought content normal.        Judgment: Judgment normal.          04/29/2023    9:58 AM 02/07/2023   11:14 AM 09/03/2022    3:03 PM 08/07/2022    8:50 AM 02/05/2022    4:19 PM  Depression screen PHQ 2/9  Decreased Interest 0 0 0 0 0  Down, Depressed, Hopeless 0 0 0 0 0  PHQ - 2 Score 0 0 0 0 0  Altered sleeping 0 2  3 0  Tired, decreased energy 0 1  1 0  Change in appetite 0 0  0 0  Feeling bad or failure about yourself  0 0  0 0  Trouble concentrating 0 0  0 1  Moving slowly or fidgety/restless 0 0  0 0  Suicidal thoughts 0 0  0 0  PHQ-9 Score 0 3  4 1   Difficult doing work/chores Not difficult at all Not difficult at all  Not difficult at all Not difficult at all        04/29/2023    9:59 AM 02/07/2023   11:15 AM 08/07/2022    8:50 AM 02/05/2022    4:20 PM  GAD 7 : Generalized Anxiety Score  Nervous, Anxious, on Edge 0 0 0 1  Control/stop worrying 0 0 0 0  Worry too much - different things 0 0 0 1  Trouble relaxing 0 0 1 0  Restless 0 0 0 0  Easily annoyed or irritable 0 0 0 1  Afraid - awful might happen 0 0 0 0  Total GAD 7  Score 0 0 1 3  Anxiety Difficulty Not difficult at all Not difficult at all Not difficult at all Somewhat difficult       Assessment & Plan:    Patient Active Problem List   Diagnosis Date Noted   Healthcare maintenance 06/26/2023   Left leg pain 04/29/2023   Insulin  resistance 03/06/2023   Vitamin D  deficiency 03/06/2023   Decreased libido 08/07/2022   Elevated blood pressure reading without diagnosis of hypertension 02/05/2022   GAD (generalized anxiety disorder) 11/30/2020   Class 2 severe obesity due to excess calories with serious comorbidity and body mass index (BMI) of 36.0 to 36.9 in adult [facility]) 12/31/2019   Hyperlipidemia due to type 1 diabetes mellitus (HCC) 10/02/2018   Attention and concentration deficit 05/30/2018   Uncontrolled type 1 diabetes mellitus with hyperglycemia (HCC) 05/30/2018   Asymptomatic HIV infection (HCC) 10/11/2017   Dehydration    DKA, type 1 (HCC) 10/06/2017   Type 1 diabetes mellitus without complications (HCC) 04/08/2017   Preventative health care 02/24/2016   Diabetes (HCC) 09/23/2014   Hyperlipidemia 09/11/2014   Fluid volume depletion 09/10/2014   Hypothyroidism 09/10/2014   Hyponatremia 09/10/2014   Diabetic ketoacidosis without coma associated with type 1 diabetes mellitus (HCC)      Problem List Items Addressed This Visit       Other   Asymptomatic HIV infection (HCC) - Primary   David Leon continues to have well-controlled virus with good adherence and tolerance to Dovato .  Reviewed previous lab work and discussed plan of care and U equals U.  No problem obtaining medication covered by [place] [employer].  Social determinants of health reviewed with no interventions indicated.  Check lab work.  Continue current  dose of Dovato .  Plan for follow-up in 6 months or sooner if needed with lab work on the same day.      Relevant Medications   dolutegravir -lamiVUDine  (DOVATO ) 50-300 MG tablet   Other Relevant Orders   Comprehensive  metabolic panel with GFR   HIV-1 RNA quant-no reflex-bld   T-helper cell (CD4)- ([facility] clinic only)   Healthcare maintenance   Not currently sexually active. Vaccinations reviewed and recommend Menveo and Prevnar 20. Deferred today. Dental care up to date.         I am having David Leon. David "Drake" maintain his glucagon , Multiple Vitamins-Minerals (MULTIVITAMIN ADULT PO), Baqsimi  One Pack, ciclopirox , rosuvastatin , buPROPion , Omnipod 5 DexG7G6 Intro Gen 5, Omnipod 5 DexG7G6 Pods Gen 5, Omnipod 5 DexG7G6 Pods Gen 5, insulin  lispro, hydrOXYzine , levothyroxine , Vitamin D  (Ergocalciferol ), busPIRone , tirzepatide , Dexcom G7 Sensor, NovoLOG  ReliOn, amphetamine -dextroamphetamine, and Dovato .   Meds ordered this encounter  Medications   dolutegravir -lamiVUDine  (DOVATO ) 50-300 MG tablet    Sig: Take 1 tablet by mouth daily. Needs appointment before additional refills    Dispense:  30 tablet    Refill:  6    Supervising Provider:   Liane Redman [phone]    Prescription Type::   Renewal     Follow-up: Return in about 6 months (around 12/26/2023). or sooner if needed.    Marlan Silva, MSN, FNP-C Nurse Practitioner [facility] Main number: [phone]

## 2023-06-26 NOTE — Assessment & Plan Note (Signed)
 Not currently sexually active. Vaccinations reviewed and recommend Menveo and Prevnar 20. Deferred today. Dental care up to date.

## 2023-06-27 ENCOUNTER — Encounter: Payer: Self-pay | Admitting: Nurse Practitioner

## 2023-06-27 LAB — T-HELPER CELL (CD4) - (RCID CLINIC ONLY)
CD4 % Helper T Cell: 45 % (ref 33–65)
CD4 T Cell Abs: 732 /uL (ref 400–1790)

## 2023-06-27 MED ORDER — AMPHETAMINE-DEXTROAMPHET ER 20 MG PO CP24: 20.0000 mg | ORAL_CAPSULE | Freq: Every day | ORAL | 0 refills | Status: DC

## 2023-06-27 NOTE — Telephone Encounter (Signed)
 LAST APPOINTMENT DATE: 04/29/2023 Acute leg pain ADHD follow up 01/28/23   NEXT APPOINTMENT DATE: 08/08/2023    LAST REFILL: 05/28/23  QTY:30 Letter 11/30/20 No uds in chart

## 2023-06-28 LAB — COMPREHENSIVE METABOLIC PANEL WITH GFR
AG Ratio: 1.8 (calc) (ref 1.0–2.5)
ALT: 14 U/L (ref 9–46)
AST: 13 U/L (ref 10–40)
Albumin: 4.2 g/dL (ref 3.6–5.1)
Alkaline phosphatase (APISO): 47 U/L (ref 36–130)
BUN: 9 mg/dL (ref 7–25)
CO2: 28 mmol/L (ref 20–32)
Calcium: 9.4 mg/dL (ref 8.6–10.3)
Chloride: 103 mmol/L (ref 98–110)
Creat: 0.92 mg/dL (ref 0.60–1.26)
Globulin: 2.4 g/dL (ref 1.9–3.7)
Glucose, Bld: 237 mg/dL — ABNORMAL HIGH (ref 65–99)
Potassium: 4.3 mmol/L (ref 3.5–5.3)
Sodium: 139 mmol/L (ref 135–146)
Total Bilirubin: 0.5 mg/dL (ref 0.2–1.2)
Total Protein: 6.6 g/dL (ref 6.1–8.1)
eGFR: 113 mL/min/{1.73_m2} (ref >=60)

## 2023-06-28 LAB — HIV-1 RNA QUANT-NO REFLEX-BLD
HIV 1 RNA Quant: NOT DETECTED {copies}/mL
HIV-1 RNA Quant, Log: NOT DETECTED {Log_copies}/mL

## 2023-07-02 ENCOUNTER — Ambulatory Visit: Payer: Self-pay | Admitting: Family

## 2023-07-22 ENCOUNTER — Other Ambulatory Visit: Payer: Self-pay | Admitting: Nurse Practitioner

## 2023-07-22 DIAGNOSIS — F411 Generalized anxiety disorder: Secondary | ICD-10-CM

## 2023-08-02 ENCOUNTER — Encounter: Payer: Self-pay | Admitting: Nurse Practitioner

## 2023-08-02 MED ORDER — AMPHETAMINE-DEXTROAMPHET ER 20 MG PO CP24: 20.0000 mg | ORAL_CAPSULE | Freq: Every day | ORAL | 0 refills | Status: DC

## 2023-08-02 NOTE — Telephone Encounter (Signed)
 LAST APPOINTMENT DATE: 05/09/23 leg pain    NEXT APPOINTMENT DATE: 08/08/2023 CPE     LAST REFILL:06/27/23  QTY: 30 no rf   UDS n/a  Contract 11/30/20

## 2023-08-08 ENCOUNTER — Encounter: Payer: 59 | Admitting: Nurse Practitioner

## 2023-08-08 NOTE — Progress Notes (Deleted)
   Established Patient Office Visit  Subjective   Patient ID: David Leon, male    DOB: 1990/04/22  Age: 33 y.o. MRN: [id number]  No chief complaint on file.   HPI  DM1: Patient currently maintained on insulin  pump and followed by endocrinology  Anxiety: Patient currently maintained on Wellbutrin  300 mg daily, BuSpar  5 mg daily, hydroxyzine  25 mg nightly as needed  HIV: Patient is followed by ID currently maintained on Dovato   Hypothyroidism: Patient currently maintained on levothyroxine  200 mcg daily  ADHD: Patient currently maintained on Adderall 20 mg XR daily  for complete physical and follow up of chronic conditions.  Immunizations: -Tetanus: Completed in 2020 -Influenza: Out of season -Shingles: Too young -Pneumonia: Completed 2020 needs Prevnar 20  Diet: Fair diet.  Exercise: No regular exercise.  Eye exam: Completes annually  Dental exam: Completes semi-annually    Colonoscopy: Too young, currently average risk Lung Cancer Screening: Completed in   PSA: Too young, currently average risk  Sleep:  STI:     {History (Optional):23778}  ROS    Objective:     There were no vitals taken for this visit. {Vitals History (Optional):23777}  Physical Exam   No results found for any visits on 08/08/23.  {Labs (Optional):23779}  The ASCVD Risk score (Arnett DK, et al., 2019) failed to calculate for the following reasons:   The 2019 ASCVD risk score is only valid for ages 76 to 76    Assessment & Plan:   Problem List Items Addressed This Visit   None   No follow-ups on file.    Adina Crandall, NP

## 2023-09-06 MED ORDER — AMPHETAMINE-DEXTROAMPHET ER 20 MG PO CP24: 20.0000 mg | ORAL_CAPSULE | Freq: Every day | ORAL | 0 refills | Status: DC

## 2023-09-06 NOTE — Addendum Note (Signed)
 Addended by: WENDEE LYNWOOD HERO on: 09/06/2023 01:00 PM   Modules accepted: Orders

## 2023-09-22 ENCOUNTER — Other Ambulatory Visit: Payer: Self-pay | Admitting: Nurse Practitioner

## 2023-09-22 DIAGNOSIS — F411 Generalized anxiety disorder: Secondary | ICD-10-CM

## 2023-10-06 ENCOUNTER — Encounter: Payer: Self-pay | Admitting: Nurse Practitioner

## 2023-10-07 NOTE — Telephone Encounter (Signed)
 Can we call the patients endocrine and ask they refill his insulin  please

## 2023-10-09 ENCOUNTER — Encounter: Payer: Self-pay | Admitting: Nurse Practitioner

## 2023-10-09 MED ORDER — AMPHETAMINE-DEXTROAMPHET ER 20 MG PO CP24: 20.0000 mg | ORAL_CAPSULE | Freq: Every day | ORAL | 0 refills | Status: DC

## 2023-10-09 NOTE — Telephone Encounter (Signed)
 Name of Medication: Adderall xr  Name of Pharmacy: [facility]  Last Fill or Written Date and Quantity: 30 no rf on 09/06/23 Last Office Visit and Type: f/u 04/29/23 Next Office Visit and Type: f/u 03/19/23 Last Controlled Substance Agreement Date: 11/30/20 Last UDS:n/a

## 2023-10-09 NOTE — Telephone Encounter (Signed)
 Needs to be seen for CPE in 30 days. He will not get more refills after this until he is seen. He can have any slot available

## 2023-10-09 NOTE — Telephone Encounter (Signed)
 Spoke with pt concerning refill request. States his endo finally sent refill in yesterday. Nothing else needed at this time.

## 2023-10-15 NOTE — Telephone Encounter (Signed)
 Lvm to schedule within next 30 days for rx refill

## 2023-11-15 ENCOUNTER — Encounter: Payer: Self-pay | Admitting: Nurse Practitioner

## 2023-11-15 ENCOUNTER — Ambulatory Visit: Admitting: Nurse Practitioner

## 2023-11-15 VITALS — BP 118/80 | HR 100 | Temp 98.1°F | Ht 71.0 in | Wt 248.4 lb

## 2023-11-15 DIAGNOSIS — E109 Type 1 diabetes mellitus without complications: Secondary | ICD-10-CM | POA: Diagnosis not present

## 2023-11-15 DIAGNOSIS — Z23 Encounter for immunization: Secondary | ICD-10-CM

## 2023-11-15 DIAGNOSIS — E039 Hypothyroidism, unspecified: Secondary | ICD-10-CM | POA: Diagnosis not present

## 2023-11-15 DIAGNOSIS — R41840 Attention and concentration deficit: Secondary | ICD-10-CM

## 2023-11-15 DIAGNOSIS — E1069 Type 1 diabetes mellitus with other specified complication: Secondary | ICD-10-CM

## 2023-11-15 DIAGNOSIS — F411 Generalized anxiety disorder: Secondary | ICD-10-CM | POA: Diagnosis not present

## 2023-11-15 DIAGNOSIS — Z0000 Encounter for general adult medical examination without abnormal findings: Secondary | ICD-10-CM

## 2023-11-15 DIAGNOSIS — E559 Vitamin D deficiency, unspecified: Secondary | ICD-10-CM

## 2023-11-15 DIAGNOSIS — E785 Hyperlipidemia, unspecified: Secondary | ICD-10-CM

## 2023-11-15 DIAGNOSIS — Z21 Asymptomatic human immunodeficiency virus [HIV] infection status: Secondary | ICD-10-CM

## 2023-11-15 LAB — LIPID PANEL
Cholesterol: 149 mg/dL (ref 0–200)
HDL: 31.3 mg/dL — ABNORMAL LOW (ref >39.00)
LDL Cholesterol: 77 mg/dL (ref 0–99)
NonHDL: 117.94
Total CHOL/HDL Ratio: 5
Triglycerides: 206 mg/dL — ABNORMAL HIGH (ref 0.0–149.0)
VLDL: 41.2 mg/dL — ABNORMAL HIGH (ref 0.0–40.0)

## 2023-11-15 LAB — CBC WITH DIFFERENTIAL/PLATELET
Basophils Absolute: 0 K/uL (ref 0.0–0.1)
Basophils Relative: 0.9 % (ref 0.0–3.0)
Eosinophils Absolute: 0.1 K/uL (ref 0.0–0.7)
Eosinophils Relative: 1.4 % (ref 0.0–5.0)
HCT: 46.9 % (ref 39.0–52.0)
Hemoglobin: 15.8 g/dL (ref 13.0–17.0)
Lymphocytes Relative: 38.1 % (ref 12.0–46.0)
Lymphs Abs: 1.7 K/uL (ref 0.7–4.0)
MCHC: 33.7 g/dL (ref 30.0–36.0)
MCV: 94.2 fl (ref 78.0–100.0)
Monocytes Absolute: 0.3 K/uL (ref 0.1–1.0)
Monocytes Relative: 7.1 % (ref 3.0–12.0)
Neutro Abs: 2.4 K/uL (ref 1.4–7.7)
Neutrophils Relative %: 52.5 % (ref 43.0–77.0)
Platelets: 239 K/uL (ref 150.0–400.0)
RBC: 4.97 Mil/uL (ref 4.22–5.81)
RDW: 11.9 % (ref 11.5–15.5)
WBC: 4.5 K/uL (ref 4.0–10.5)

## 2023-11-15 LAB — COMPREHENSIVE METABOLIC PANEL WITH GFR
ALT: 13 U/L (ref 0–53)
AST: 13 U/L (ref 0–37)
Albumin: 4.5 g/dL (ref 3.5–5.2)
Alkaline Phosphatase: 51 U/L (ref 39–117)
BUN: 8 mg/dL (ref 6–23)
CO2: 27 meq/L (ref 19–32)
Calcium: 9 mg/dL (ref 8.4–10.5)
Chloride: 102 meq/L (ref 96–112)
Creatinine, Ser: 0.92 mg/dL (ref 0.40–1.50)
GFR: 108.94 mL/min (ref >60.00)
Glucose, Bld: 228 mg/dL — ABNORMAL HIGH (ref 70–99)
Potassium: 4.6 meq/L (ref 3.5–5.1)
Sodium: 139 meq/L (ref 135–145)
Total Bilirubin: 0.4 mg/dL (ref 0.2–1.2)
Total Protein: 6.8 g/dL (ref 6.0–8.3)

## 2023-11-15 LAB — MICROALBUMIN / CREATININE URINE RATIO
Creatinine,U: 237 mg/dL
Microalb Creat Ratio: 4.5 mg/g (ref 0.0–30.0)
Microalb, Ur: 1.1 mg/dL (ref 0.0–1.9)

## 2023-11-15 LAB — VITAMIN D 25 HYDROXY (VIT D DEFICIENCY, FRACTURES): VITD: 17.24 ng/mL — ABNORMAL LOW (ref 30.00–100.00)

## 2023-11-15 LAB — TSH: TSH: 0.72 u[IU]/mL (ref 0.35–5.50)

## 2023-11-15 MED ORDER — BUSPIRONE HCL 5 MG PO TABS: 5.0000 mg | ORAL_TABLET | Freq: Every day | ORAL | 2 refills | Status: AC

## 2023-11-15 MED ORDER — HYDROXYZINE PAMOATE 25 MG PO CAPS: 25.0000 mg | ORAL_CAPSULE | Freq: Every evening | ORAL | 1 refills | Status: AC | PRN

## 2023-11-15 MED ORDER — AMPHETAMINE-DEXTROAMPHET ER 20 MG PO CP24: 20.0000 mg | ORAL_CAPSULE | Freq: Every day | ORAL | 0 refills | Status: DC

## 2023-11-15 NOTE — Assessment & Plan Note (Signed)
 Currently maintained on Dovato  and followed by infectious disease.  Continue taking medication prescribed and follow-up with specialist as recommended

## 2023-11-15 NOTE — Assessment & Plan Note (Signed)
 History of the same currently maintained on bupropion  300 mg daily and BuSpar  5 mg daily.  Continue medication as prescribed patient denies HI/SI/AVH.

## 2023-11-15 NOTE — Patient Instructions (Signed)
 Nice to see you today  I will be in touch with the labs once I have them  We did update your flu vaccine today  We started the HPV vaccine series today . You will need a nurse visit in 1 month for the 2nd dose Make a follow up with me in 6 months and we can give you the third dose then

## 2023-11-15 NOTE — Assessment & Plan Note (Signed)
 Currently maintained on rosuvastatin  5 mg daily.  Pending lipid panel patient has lost 12 pounds since last office visit.  Continue working lifestyle modifications

## 2023-11-15 NOTE — Assessment & Plan Note (Signed)
 History of the same patient followed by endocrinology levothyroxine  200 mcg daily.  Pending TSH follow-up endocrinology as recommended

## 2023-11-15 NOTE — Progress Notes (Signed)
 Established Patient Office Visit  Subjective   Patient ID: David Leon, male    DOB: 1990-07-07  Age: 33 y.o. MRN: [id number]  Chief Complaint  Patient presents with   Annual Exam    Pt requests refills for hydroxyzine , buspar , and adderall.     HPI  DM1: Patient currently followed by endocrinology.  Maintained on Omnipod insulin  pump  HLD: History of the same currently maintained on rosuvastatin  5 mg daily  Hypothyroidism: Currently maintained on levothyroxine  200 mcg daily  GAD: Currently maintained on bupropion  300 mg daily and buspirone  5 mg twice daily.  Patient has hydroxyzine  to use as needed.  ADHD: Currently maintained on Adderall 20 mg daily  HIV: Patient is followed by ID currently maintained on Dovato  50-300 mg daily  for complete physical and follow up of chronic conditions.  Immunizations: -Tetanus: Completed in 2020 -Influenza: update today  -Shingles:  too young -Pneumonia: Completed 2020 -HPV: update today  Diet: Fair diet. 2 meals a day and snakcs. He does coffee water and propel  Exercise:  He is gym 5 days a week. He is there for 30 mins   Eye exam: Completes annually. Up to date   Dental exam: Completes semi-annually    Colonoscopy: Too young, currently average risk Lung Cancer Screening: N/A  PSA: Too young, currently average risk  Sleep: going to bed around 11-1128 and get up around 6. He does feel rested some days.        Review of Systems  Constitutional:  Negative for chills and fever.  Respiratory:  Negative for shortness of breath.   Cardiovascular:  Negative for chest pain and leg swelling.  Gastrointestinal:  Negative for abdominal pain, blood in stool, constipation, diarrhea, nausea and vomiting.       BM every other day   Genitourinary:  Negative for dysuria and hematuria.  Neurological:  Negative for dizziness, tingling and headaches.  Psychiatric/Behavioral:  Negative for hallucinations and suicidal ideas.        Objective:     BP 118/80   Pulse 100   Temp 98.1 F (36.7 C) (Oral)   Ht 5' 11 (1.803 m)   Wt 248 lb 6.4 oz (112.7 kg)   SpO2 98%   BMI 34.64 kg/m  BP Readings from Last 3 Encounters:  11/15/23 118/80  06/26/23 135/88  04/29/23 118/84   Wt Readings from Last 3 Encounters:  11/15/23 248 lb 6.4 oz (112.7 kg)  06/26/23 249 lb (112.9 kg)  04/29/23 260 lb 3.2 oz (118 kg)   SpO2 Readings from Last 3 Encounters:  11/15/23 98%  06/26/23 100%  04/29/23 97%      Physical Exam Vitals and nursing note reviewed.  Constitutional:      Appearance: Normal appearance.  HENT:     Right Ear: Tympanic membrane, ear canal and external ear normal.     Left Ear: Tympanic membrane, ear canal and external ear normal.     Mouth/Throat:     Mouth: Mucous membranes are moist.     Pharynx: Oropharynx is clear.  Eyes:     Extraocular Movements: Extraocular movements intact.     Pupils: Pupils are equal, round, and reactive to light.  Cardiovascular:     Rate and Rhythm: Normal rate and regular rhythm.     Pulses: Normal pulses.     Heart sounds: Normal heart sounds.  Pulmonary:     Effort: Pulmonary effort is normal.     Breath sounds: Normal breath sounds.  Abdominal:     General: Bowel sounds are normal. There is no distension.     Palpations: There is no mass.     Tenderness: There is no abdominal tenderness.     Hernia: No hernia is present.  Genitourinary:    Comments: Deferred  Musculoskeletal:     Right lower leg: No edema.     Left lower leg: No edema.  Lymphadenopathy:     Cervical: No cervical adenopathy.  Skin:    General: Skin is warm.  Neurological:     General: No focal deficit present.     Mental Status: He is alert.     Deep Tendon Reflexes:     Reflex Scores:      Bicep reflexes are 2+ on the right side and 2+ on the left side.      Patellar reflexes are 2+ on the right side and 2+ on the left side.    Comments: Bilateral upper and lower extremity strength  5/5  Psychiatric:        Mood and Affect: Mood normal.        Behavior: Behavior normal.        Thought Content: Thought content normal.        Judgment: Judgment normal.      No results found for any visits on 11/15/23.    The ASCVD Risk score (Arnett DK, et al., 2019) failed to calculate for the following reasons:   The 2019 ASCVD risk score is only valid for ages 34 to 55    Assessment & Plan:   Problem List Items Addressed This Visit       Endocrine   Hypothyroidism   History of the same patient followed by endocrinology levothyroxine  200 mcg daily.  Pending TSH follow-up endocrinology as recommended      Relevant Orders   TSH   Type 1 diabetes mellitus without complications [facility])   Patient currently following with endocrinology.  He does have an OmniPod insulin  pump.  Continue following with endocrinology and take medications as prescribed      Relevant Orders   CBC with Differential/Platelet   Comprehensive metabolic panel with GFR   Lipid panel   Microalbumin / creatinine urine ratio   Hyperlipidemia due to type 1 diabetes mellitus (HCC)   Currently maintained on rosuvastatin  5 mg daily.  Pending lipid panel patient has lost 12 pounds since last office visit.  Continue working lifestyle modifications      Relevant Orders   Lipid panel     Other   Preventative health care - Primary   Discussed age-appropriate immunizations and screening exams.  Did review patient's personal, surgical, social, family histories.  Patient up-to-date on all age-appropriate vaccinations he would like.  Update flu vaccine today and start HPV vaccine series.  Patient is too young for CRC screening or prostate cancer screening.  Patient was given information at discharge about preventative healthcare maintenance with anticipatory guidance.      Relevant Orders   CBC with Differential/Platelet   Comprehensive metabolic panel with GFR   TSH   Asymptomatic HIV infection (HCC)    Currently maintained on Dovato  and followed by infectious disease.  Continue taking medication prescribed and follow-up with specialist as recommended      Relevant Orders   CBC with Differential/Platelet   Attention and concentration deficit   Currently maintained on Adderall 20 mg daily.  Patient tolerating medication well.  PDMP reviewed.  Continue medication as prescribed refills provided today  Relevant Orders   TSH   GAD (generalized anxiety disorder)   History of the same currently maintained on bupropion  300 mg daily and BuSpar  5 mg daily.  Continue medication as prescribed patient denies HI/SI/AVH.      Relevant Medications   busPIRone  (BUSPAR ) 5 MG tablet   hydrOXYzine  (VISTARIL ) 25 MG capsule   Other Relevant Orders   TSH   Vitamin D  deficiency   History of the same pending vitamin D  level today      Relevant Orders   VITAMIN D  25 Hydroxy (Vit-D Deficiency, Fractures)    Return in about 6 months (around 05/15/2024) for ADD/ADHD recheck .    Adina Crandall, NP

## 2023-11-15 NOTE — Assessment & Plan Note (Signed)
History of the same pending vitamin D level today

## 2023-11-15 NOTE — Assessment & Plan Note (Signed)
 Currently maintained on Adderall 20 mg daily.  Patient tolerating medication well.  PDMP reviewed.  Continue medication as prescribed refills provided today

## 2023-11-15 NOTE — Assessment & Plan Note (Signed)
 Patient currently following with endocrinology.  He does have an OmniPod insulin  pump.  Continue following with endocrinology and take medications as prescribed

## 2023-11-15 NOTE — Assessment & Plan Note (Signed)
 Discussed age-appropriate immunizations and screening exams.  Did review patient's personal, surgical, social, family histories.  Patient up-to-date on all age-appropriate vaccinations he would like.  Update flu vaccine today and start HPV vaccine series.  Patient is too young for CRC screening or prostate cancer screening.  Patient was given information at discharge about preventative healthcare maintenance with anticipatory guidance.

## 2023-11-19 ENCOUNTER — Ambulatory Visit: Payer: Self-pay | Admitting: Nurse Practitioner

## 2023-11-19 DIAGNOSIS — E559 Vitamin D deficiency, unspecified: Secondary | ICD-10-CM

## 2023-11-19 MED ORDER — VITAMIN D (ERGOCALCIFEROL) 1.25 MG (50000 UNIT) PO CAPS: 50000.0000 [IU] | ORAL_CAPSULE | ORAL | 0 refills | Status: AC

## 2023-11-21 ENCOUNTER — Other Ambulatory Visit (HOSPITAL_BASED_OUTPATIENT_CLINIC_OR_DEPARTMENT_OTHER): Payer: Self-pay

## 2023-12-11 ENCOUNTER — Telehealth (INDEPENDENT_AMBULATORY_CARE_PROVIDER_SITE_OTHER): Payer: Self-pay

## 2023-12-11 DIAGNOSIS — Z23 Encounter for immunization: Secondary | ICD-10-CM

## 2023-12-11 NOTE — Telephone Encounter (Signed)
 Documented flu and HPV vaccine done on 11/15/23

## 2023-12-15 ENCOUNTER — Other Ambulatory Visit: Payer: Self-pay | Admitting: Nurse Practitioner

## 2023-12-15 DIAGNOSIS — E039 Hypothyroidism, unspecified: Secondary | ICD-10-CM

## 2023-12-17 ENCOUNTER — Ambulatory Visit (INDEPENDENT_AMBULATORY_CARE_PROVIDER_SITE_OTHER)

## 2023-12-17 DIAGNOSIS — Z23 Encounter for immunization: Secondary | ICD-10-CM | POA: Diagnosis not present

## 2023-12-17 NOTE — Progress Notes (Signed)
 Per orders of [employer] DPN AGNP-C, injection of gardasil given by Laray Arenas in left deltoid. Patient tolerated injection well. Patient will make appointment for 6 month.

## 2023-12-18 ENCOUNTER — Encounter: Payer: Self-pay | Admitting: Nurse Practitioner

## 2024-02-28 ENCOUNTER — Telehealth: Payer: Self-pay

## 2024-02-28 DIAGNOSIS — R41840 Attention and concentration deficit: Secondary | ICD-10-CM

## 2024-02-28 MED ORDER — AMPHETAMINE-DEXTROAMPHET ER 20 MG PO CP24: 20.0000 mg | ORAL_CAPSULE | Freq: Every day | ORAL | 0 refills | Status: AC

## 2024-02-28 NOTE — Telephone Encounter (Signed)
 Refills sent in

## 2024-02-28 NOTE — Addendum Note (Signed)
 Addended by: WENDEE LYNWOOD HERO on: 02/28/2024 04:17 PM   Modules accepted: Orders

## 2024-02-28 NOTE — Telephone Encounter (Signed)
 Requesting: Adderall xr 20 Contract: Yes done in 2022  UDS:  Last Visit:11/15/23 Next Visit: 05/15/2024 Last Refill: 3 scripts given 11/15/23  Please AdviseCopied from CRM #[id number]. Topic: Clinical - Medication Refill >> Feb 27, 2024  5:41 PM Taleah C wrote: Medication: adderall  Has the patient contacted their pharmacy? Yes (Agent: If no, request that the patient contact the pharmacy for the refill. If patient does not wish to contact the pharmacy document the reason why and proceed with request.) (Agent: If yes, when and what did the pharmacy advise?)  This is the patient's preferred pharmacy:  [facility] [address] [place] - [address] [place] [address] Phone: [phone] Fax: [phone]  Is this the correct pharmacy for this prescription? Yes If no, delete pharmacy and type the correct one.   Has the prescription been filled recently? Yes  Is the patient out of the medication? Yes  Has the patient been seen for an appointment in the last year OR does the patient have an upcoming appointment? Yes  Can we respond through MyChart? Yes  Agent: Please be advised that Rx refills may take up to 3 business days. We ask that you follow-up with your pharmacy.

## 2024-02-28 NOTE — Telephone Encounter (Signed)
 Called and spoke with patient. Relayed info regarding prescription adderall. Pt verbalized understanding and has no questions or concerns.

## 2024-03-18 ENCOUNTER — Encounter: Admitting: Nurse Practitioner

## 2024-05-15 ENCOUNTER — Ambulatory Visit: Admitting: Nurse Practitioner
